# Patient Record
Sex: Female | Born: 1980
Health system: Southern US, Community
[De-identification: ages and names within clinical notes are randomized; demographics above are authoritative.]

## PROBLEM LIST (undated history)

## (undated) DIAGNOSIS — J189 Pneumonia, unspecified organism: Secondary | ICD-10-CM

## (undated) DIAGNOSIS — I10 Essential (primary) hypertension: Secondary | ICD-10-CM

## (undated) DIAGNOSIS — M7989 Other specified soft tissue disorders: Secondary | ICD-10-CM

## (undated) DIAGNOSIS — E669 Obesity, unspecified: Secondary | ICD-10-CM

## (undated) DIAGNOSIS — G5600 Carpal tunnel syndrome, unspecified upper limb: Secondary | ICD-10-CM

## (undated) DIAGNOSIS — E119 Type 2 diabetes mellitus without complications: Secondary | ICD-10-CM

## (undated) DIAGNOSIS — G932 Benign intracranial hypertension: Secondary | ICD-10-CM

## (undated) DIAGNOSIS — R0602 Shortness of breath: Secondary | ICD-10-CM

## (undated) DIAGNOSIS — M549 Dorsalgia, unspecified: Secondary | ICD-10-CM

## (undated) DIAGNOSIS — E7800 Pure hypercholesterolemia, unspecified: Secondary | ICD-10-CM

## (undated) HISTORY — DX: Dorsalgia, unspecified: M54.9

## (undated) HISTORY — DX: Pure hypercholesterolemia, unspecified: E78.00

## (undated) HISTORY — DX: Shortness of breath: R06.02

## (undated) HISTORY — DX: Type 2 diabetes mellitus without complications: E11.9

## (undated) HISTORY — DX: Carpal tunnel syndrome, unspecified upper limb: G56.00

## (undated) HISTORY — DX: Benign intracranial hypertension: G93.2

## (undated) HISTORY — DX: Obesity, unspecified: E66.9

## (undated) HISTORY — DX: Other specified soft tissue disorders: M79.89

## (undated) HISTORY — DX: Essential (primary) hypertension: I10

## (undated) HISTORY — PX: NO PAST SURGERIES: SHX2092

---

## 2004-06-04 ENCOUNTER — Other Ambulatory Visit: Admission: RE | Admit: 2004-06-04 | Discharge: 2004-06-04 | Payer: Self-pay | Admitting: Gynecology

## 2004-08-13 ENCOUNTER — Encounter: Admission: RE | Admit: 2004-08-13 | Discharge: 2004-08-13 | Payer: Self-pay | Admitting: Internal Medicine

## 2005-07-23 ENCOUNTER — Other Ambulatory Visit: Admission: RE | Admit: 2005-07-23 | Discharge: 2005-07-23 | Payer: Self-pay | Admitting: Gynecology

## 2005-10-16 ENCOUNTER — Encounter: Admission: RE | Admit: 2005-10-16 | Discharge: 2005-10-16 | Payer: Self-pay | Admitting: Internal Medicine

## 2008-12-29 ENCOUNTER — Encounter: Admission: RE | Admit: 2008-12-29 | Discharge: 2008-12-29 | Payer: Self-pay | Admitting: Internal Medicine

## 2011-07-11 ENCOUNTER — Other Ambulatory Visit (HOSPITAL_COMMUNITY): Payer: Self-pay | Admitting: Internal Medicine

## 2011-07-11 DIAGNOSIS — I2699 Other pulmonary embolism without acute cor pulmonale: Secondary | ICD-10-CM

## 2011-07-11 DIAGNOSIS — R000 Tachycardia, unspecified: Secondary | ICD-10-CM

## 2011-07-12 ENCOUNTER — Ambulatory Visit (HOSPITAL_COMMUNITY)
Admission: RE | Admit: 2011-07-12 | Discharge: 2011-07-12 | Disposition: A | Discharge status: Home / Self Care | Payer: Medicare Other | Source: Ambulatory Visit | Attending: Internal Medicine | Admitting: Internal Medicine

## 2011-07-12 DIAGNOSIS — R079 Chest pain, unspecified: Secondary | ICD-10-CM | POA: Insufficient documentation

## 2011-07-12 DIAGNOSIS — R000 Tachycardia, unspecified: Secondary | ICD-10-CM | POA: Insufficient documentation

## 2011-07-12 DIAGNOSIS — I2699 Other pulmonary embolism without acute cor pulmonale: Secondary | ICD-10-CM

## 2011-07-12 MED ORDER — IOHEXOL 350 MG/ML SOLN
100.0000 mL | Freq: Once | INTRAVENOUS | Status: AC | PRN
  Administered 2011-07-12: 100 mL via INTRAVENOUS

## 2012-05-21 ENCOUNTER — Encounter: Payer: Self-pay | Admitting: *Deleted

## 2012-05-21 ENCOUNTER — Encounter: Payer: Medicare Other | Attending: Internal Medicine | Admitting: *Deleted

## 2012-05-21 DIAGNOSIS — Z713 Dietary counseling and surveillance: Secondary | ICD-10-CM | POA: Insufficient documentation

## 2012-05-21 DIAGNOSIS — E119 Type 2 diabetes mellitus without complications: Secondary | ICD-10-CM | POA: Insufficient documentation

## 2012-05-21 NOTE — Patient Instructions (Signed)
Plan:  Aim for 4 Carb Choices per meal (60 grams) +/- 1 either way  Aim for 0-2 Carbs per snack if hungry  Consider reading food labels for Total Carbohydrate of foods Consider  increasing your activity level by jumping rope, dancing or treadmill for 15 minutes daily as tolerated Consider walking at the park with family 2-3 days a week

## 2012-05-21 NOTE — Progress Notes (Signed)
  Medical Nutrition Therapy:  Appt start time: 1130 end time:  1230.  Assessment:  Primary concerns today: weight loss. Patient here with her mother and aunt who both appear supportive. Patient has mild mental retardation and did participate in the visit. She states she works at  [employer] 10 - 3 Monday through Friday.  Diet and activity history obtained thru assistance of her aunt and mother. Referral indicated diagnosis of diabetes but mother states she had pre-diabetes in the past but that her BG improved and no diabetes yet. No diabetes medications or A1c info provided from referral.  MEDICATIONS: see list   DIETARY INTAKE:  Usual eating pattern includes 3 meals and 1-2 snacks per day.  Everyday foods include fair variety of all food groups.  Avoided foods include some fruits she doesn't care for.    24-hr recall:  B ( AM): waffle x 1-2, syrup, oleo, water OR grits, bacon OR sweet cereal with 2% milk (8 oz) Snk ( AM): Cheetos in small bag, diet Coke  L ( PM): PNB sandwich, yogurt OR Spaghetti leftovers, Diet Coke  Snk ( PM): occasionally cereal in large portion D ( PM): take out often: [place] egg roll, 1/2 ofshrimp with pork fried rice, water, OR Hamburgers, fries small  OR [employer]:pasta meal,  Snk ( PM): none Beverages: water, Diet Coke, coffee with Sweet and Low and cream, occasionally a beer  Usual physical activity: walk and jump rope at home, likes to dance  Estimated energy needs: 1600 calories 180 g carbohydrates 120 g protein 44 g fat  Progress Towards Goal(s):  In progress.   Nutritional Diagnosis:  NI-1.5 Excessive energy intake As related to activity level.  As evidenced by BMI of 51.3.    Intervention:  Nutrition counseling for weight loss initiated. Discussed Carb Counting as method for making food choice and portion size decisions, reading food labels, and benefits of increased activity. Mother and aunt both expressed good verbal understanding of the 4  Carb plan per meal and how to read food labels for assessment of portions of foods. Patient states she enjoys jumping rope and dancing so those were chosen as activities of choice to increase her metabolism. Also explained rationale of weight loss and increased activity to post pone or perhaps prevent diagnosis of diabetes.  Plan:  Aim for 4 Carb Choices per meal (60 grams) +/- 1 either way  Aim for 0-2 Carbs per snack if hungry  Consider reading food labels for Total Carbohydrate of foods Consider  increasing your activity level by jumping rope, dancing or treadmill for 15 minutes daily as tolerated Consider walking at the park with family 2-3 days a week  Handouts given during visit include: Carb Counting and Food Label handouts Meal Plan Card  Monitoring/Evaluation:  Dietary intake, exercise, reading food labels, and body weight in 6 week(s). Family to call to make the appointment once they check their calendars

## 2012-05-23 ENCOUNTER — Encounter: Payer: Self-pay | Admitting: *Deleted

## 2012-06-29 ENCOUNTER — Ambulatory Visit: Payer: Medicare Other | Admitting: *Deleted

## 2019-04-20 ENCOUNTER — Ambulatory Visit (INDEPENDENT_AMBULATORY_CARE_PROVIDER_SITE_OTHER)
Admission: EM | Admit: 2019-04-20 | Discharge: 2019-04-20 | Disposition: A | Discharge status: Home / Self Care | Payer: Medicare Other | Source: Home / Self Care

## 2019-04-20 ENCOUNTER — Other Ambulatory Visit: Payer: Self-pay

## 2019-04-20 ENCOUNTER — Encounter (HOSPITAL_COMMUNITY): Payer: Self-pay

## 2019-04-20 ENCOUNTER — Inpatient Hospital Stay (HOSPITAL_COMMUNITY)
Admission: EM | Admit: 2019-04-20 | Discharge: 2019-04-24 | DRG: 871 | Disposition: A | Discharge status: Home / Self Care | Payer: Medicare Other | Attending: Internal Medicine | Admitting: Internal Medicine

## 2019-04-20 ENCOUNTER — Emergency Department (HOSPITAL_COMMUNITY): Payer: Medicare Other

## 2019-04-20 ENCOUNTER — Ambulatory Visit (HOSPITAL_COMMUNITY)
Admission: RE | Admit: 2019-04-20 | Discharge: 2019-04-20 | Disposition: A | Discharge status: Home / Self Care | Payer: Medicare Other | Source: Ambulatory Visit | Attending: Family Medicine | Admitting: Family Medicine

## 2019-04-20 DIAGNOSIS — E119 Type 2 diabetes mellitus without complications: Secondary | ICD-10-CM | POA: Insufficient documentation

## 2019-04-20 DIAGNOSIS — R0602 Shortness of breath: Secondary | ICD-10-CM

## 2019-04-20 DIAGNOSIS — J189 Pneumonia, unspecified organism: Secondary | ICD-10-CM | POA: Diagnosis present

## 2019-04-20 DIAGNOSIS — I152 Hypertension secondary to endocrine disorders: Secondary | ICD-10-CM | POA: Diagnosis present

## 2019-04-20 DIAGNOSIS — A419 Sepsis, unspecified organism: Secondary | ICD-10-CM | POA: Diagnosis not present

## 2019-04-20 DIAGNOSIS — R0902 Hypoxemia: Secondary | ICD-10-CM | POA: Insufficient documentation

## 2019-04-20 DIAGNOSIS — E669 Obesity, unspecified: Secondary | ICD-10-CM | POA: Diagnosis present

## 2019-04-20 DIAGNOSIS — J9601 Acute respiratory failure with hypoxia: Secondary | ICD-10-CM | POA: Diagnosis present

## 2019-04-20 DIAGNOSIS — Z6841 Body Mass Index (BMI) 40.0 and over, adult: Secondary | ICD-10-CM

## 2019-04-20 DIAGNOSIS — Z8249 Family history of ischemic heart disease and other diseases of the circulatory system: Secondary | ICD-10-CM

## 2019-04-20 DIAGNOSIS — I160 Hypertensive urgency: Secondary | ICD-10-CM | POA: Diagnosis present

## 2019-04-20 DIAGNOSIS — R509 Fever, unspecified: Secondary | ICD-10-CM | POA: Insufficient documentation

## 2019-04-20 DIAGNOSIS — Z79899 Other long term (current) drug therapy: Secondary | ICD-10-CM | POA: Insufficient documentation

## 2019-04-20 DIAGNOSIS — R739 Hyperglycemia, unspecified: Secondary | ICD-10-CM | POA: Diagnosis present

## 2019-04-20 DIAGNOSIS — Z20828 Contact with and (suspected) exposure to other viral communicable diseases: Secondary | ICD-10-CM | POA: Insufficient documentation

## 2019-04-20 DIAGNOSIS — F79 Unspecified intellectual disabilities: Secondary | ICD-10-CM | POA: Insufficient documentation

## 2019-04-20 DIAGNOSIS — E1165 Type 2 diabetes mellitus with hyperglycemia: Secondary | ICD-10-CM | POA: Diagnosis present

## 2019-04-20 DIAGNOSIS — R05 Cough: Secondary | ICD-10-CM | POA: Insufficient documentation

## 2019-04-20 DIAGNOSIS — R6250 Unspecified lack of expected normal physiological development in childhood: Secondary | ICD-10-CM | POA: Diagnosis present

## 2019-04-20 DIAGNOSIS — I10 Essential (primary) hypertension: Secondary | ICD-10-CM | POA: Diagnosis present

## 2019-04-20 DIAGNOSIS — Z833 Family history of diabetes mellitus: Secondary | ICD-10-CM | POA: Insufficient documentation

## 2019-04-20 LAB — RESPIRATORY PANEL BY RT PCR (FLU A&B, COVID)
Influenza A by PCR: NEGATIVE
Influenza B by PCR: NEGATIVE
SARS Coronavirus 2 by RT PCR: NEGATIVE

## 2019-04-20 LAB — POC SARS CORONAVIRUS 2 AG -  ED: SARS Coronavirus 2 Ag: NEGATIVE

## 2019-04-20 LAB — POC SARS CORONAVIRUS 2 AG: SARS Coronavirus 2 Ag: NEGATIVE

## 2019-04-20 MED ORDER — SODIUM CHLORIDE 0.9 % IV SOLN
1000.0000 mL | INTRAVENOUS | Status: DC
Start: 2019-04-20 — End: 2019-04-21
  Administered 2019-04-20: 23:00:00 1000 mL via INTRAVENOUS

## 2019-04-20 MED ORDER — SODIUM CHLORIDE 0.9 % IV BOLUS
1000.0000 mL | Freq: Once | INTRAVENOUS | Status: AC
  Administered 2019-04-20: 1000 mL via INTRAVENOUS

## 2019-04-20 NOTE — ED Triage Notes (Signed)
Pt accompanied by mother who states pt has had cough and sob at home for the past few days. Pt went to PCP today and they called with results of penumonia on xray. RR 30-35 in triage, 76% on room air, 3L [place] applied, increased saturation to 100%. Tachycardic 130. Pt alert, anxious

## 2019-04-20 NOTE — ED Provider Notes (Signed)
[facility]    CSN: [id number] Arrival date & time: 04/20/19  1409      History   Chief Complaint Chief Complaint  Patient presents with  . Cough    HPI [name] is a 38 y.o. female.   Patient is a 38 year old female past medical history of diabetes, obesity, mental retardation.  She presents today with Regina Wood.  Per Regina Wood she has had cough, headache, fever for approximately 1 week.  They have been given Tylenol without much relief of her symptoms.  Upon arrival today she is mildly tachypneic with oxygen saturations 85% and pulse 122.  Patient is very anxious.  Regina Wood reports the cough has been dry.  She goes to school 2 days a week. No hx of DVT or PE. No recent travels.   ROS per HPI    Cough   Past Medical History:  Diagnosis Date  . Diabetes mellitus without complication (HCC)   . Obesity     There are no problems to display for this patient.   History reviewed. No pertinent surgical history.  OB History   No obstetric history on file.      Home Medications    Prior to Admission medications   Medication Sig Start Date End Date Taking? Authorizing Provider  ARIPiprazole (ABILIFY) 10 MG tablet Take 10 mg by mouth daily.    [provider]  benztropine (COGENTIN) 1 MG tablet Take 1 mg by mouth daily.    [provider]  naproxen sodium (ANAPROX) 550 MG tablet Take 550 mg by mouth as needed.    [provider]    Family History Family History  Problem Relation Age of Onset  . Healthy Mother   . Hypertension Father   . Diabetes Father     Social History Social History   Tobacco Use  . Smoking status: Never Smoker  . Smokeless tobacco: Never Used  Substance Use Topics  . Alcohol use: Yes    Comment: twice a month, wine or beer  . Drug use: Not on file     Allergies   Patient has no known allergies.   Review of Systems Review of Systems  Respiratory: Positive for cough.      Physical Exam Triage  Vital Signs ED Triage Vitals  Enc Vitals Group     BP 04/20/19 1439 (!) 185/84     Pulse Rate 04/20/19 1439 (!) 122     Resp 04/20/19 1439 (!) 21     Temp 04/20/19 1439 99 F (37.2 C)     Temp Source 04/20/19 1439 Oral     SpO2 04/20/19 1439 (!) 85 %     Weight 04/20/19 1435 287 lb (130.2 kg)     Height --      Head Circumference --      Peak Flow --      Pain Score 04/20/19 1433 0     Pain Loc --      Pain Edu? --      Excl. in GC? --    No data found.  Updated Vital Signs BP (!) 185/84 (BP Location: Right Arm)   Pulse (!) 122   Temp 99 F (37.2 C) (Oral)   Resp (!) 21   Wt 287 lb (130.2 kg)   LMP 04/05/2019   SpO2 (!) 85%   BMI 56.05 kg/m   Visual Acuity Right Eye Distance:   Left Eye Distance:   Bilateral Distance:    Right Eye  Near:   Left Eye Near:    Bilateral Near:     Physical Exam Vitals and nursing note reviewed.  Constitutional:      General: She is not in acute distress.    Appearance: She is obese. She is ill-appearing. She is not toxic-appearing or diaphoretic.  HENT:     Head: Normocephalic and atraumatic.     Nose: Nose normal.  Eyes:     Conjunctiva/sclera: Conjunctivae normal.  Cardiovascular:     Rate and Rhythm: Tachycardia present.  Pulmonary:     Effort: Tachypnea present. No retractions.     Breath sounds: Normal breath sounds.  Musculoskeletal:        General: Normal range of motion.  Skin:    General: Skin is warm and dry.  Neurological:     Mental Status: She is alert.  Psychiatric:        Mood and Affect: Mood normal.      UC Treatments / Results  Labs (all labs ordered are listed, but only abnormal results are displayed) Labs Reviewed  NOVEL CORONAVIRUS, NAA (HOSP ORDER, SEND-OUT TO REF LAB; TAT 18-24 HRS)  POC SARS CORONAVIRUS 2 AG  POC SARS CORONAVIRUS 2 AG -  ED    EKG   Radiology DG Chest 2 View  Result Date: 04/20/2019 CLINICAL DATA:  Cough, fatigue, query COVID EXAM: CHEST - 2 VIEW COMPARISON:   12/29/2008 FINDINGS: Mild cardiomegaly. Diffuse bilateral heterogeneous airspace opacity, most conspicuous in the right lung base. The visualized skeletal structures are unremarkable. IMPRESSION: Diffuse bilateral heterogeneous airspace opacity, most conspicuous in the right lung base and consistent with multifocal infection, including COVID-19 if clinically suspected. Electronically Signed   By: Eddie Candle M.D.   On: 04/20/2019 16:39    Procedures Procedures (including critical care time)  Medications Ordered in UC Medications - No data to display  Initial Impression / Assessment and Plan / UC Course  I have reviewed the triage vital signs and the nursing notes.  Pertinent labs & imaging results that were available during my care of the patient were reviewed by me and considered in my medical decision making (see chart for details).     38 year old presents today with cough, shortness of breath-oxygen saturations 85%.  Highest oxygen she was 91%.  Patient tachycardic at 122.  Patient very anxious about being here in the situation. Rapid Covid test negative here.  Most likely false negative or possibly dealing with pneumonia here. Spoke with Regina Wood and recommended patient go to the hospital for further evaluation based on hypoxia. Regina Wood does not want to take patient to the hospital and reports he will follow with primary care. Regina Wood is her PCP.  Discussed the risk of not taking her to the hospital.  We agreed on going for outpatient x ray to get a better picture of what may be going on.  Spoke with Dr. Hervey Ard nurse over the phone with my concerns. They would like for me to call once I receive the x ray report.   Received the x ray results which revealed bilateral multifocal pneumonia and concern for COVID pneumonia which is highly likely.   Spoke with Regina Wood over the phone directly and gave results and voiced my concern. He was also concerned. He is going to call the family and advise  that she be admitted to the hospital based on vital signs and results. Likely she has COVID  She was hypoxic at the urgent care and needs oxygen. This  is life threatening and she will likely decompensate quickly without intervention.  Final Clinical Impressions(s) / UC Diagnoses   Final diagnoses:  Hypoxia  Fever, unspecified  SOB (shortness of breath)     Discharge Instructions     Go to [facility] and get a chest x ray     ED Prescriptions    None     PDMP not reviewed this encounter.   Janace Aris, NP 04/20/19 1727

## 2019-04-20 NOTE — ED Provider Notes (Addendum)
[facility] EMERGENCY DEPARTMENT Provider Note   CSN: [id number] Arrival date & time: 04/20/19  1821     History Chief Complaint  Patient presents with  . Shortness of Breath  . Pneumonia    Regina Wood is a 38 y.o. female.  HPI   Patient presents ED for evaluation of shortness of breath.  Patient presented to an urgent care tonight with complaints of cough, headache and fever for 1 week.  Patient has been taking over-the-counter Tylenol.  When she arrived at the urgent care they noted she was tachycardic and her oxygen saturation was in the 80s.  Patient has a history of MR and goes to school 2 days a week.  There is question of a possible ill contact.  No history of DVT or PE no recent travels.  Patient herself tells me that she has a headache but denied coughing.  Her family member however confirmed that she has been coughing.  Past Medical History:  Diagnosis Date  . Diabetes mellitus without complication (HCC)   . Obesity     There are no problems to display for this patient.   History reviewed. No pertinent surgical history.   OB History   No obstetric history on file.     Family History  Problem Relation Age of Onset  . Healthy Mother   . Hypertension Father   . Diabetes Father     Social History   Tobacco Use  . Smoking status: Never Smoker  . Smokeless tobacco: Never Used  Substance Use Topics  . Alcohol use: Yes    Comment: twice a month, wine or beer  . Drug use: Not on file    Home Medications Prior to Admission medications   Medication Sig Start Date End Date Taking? Authorizing Provider  ARIPiprazole (ABILIFY) 10 MG tablet Take 10 mg by mouth daily.    [provider]  benztropine (COGENTIN) 1 MG tablet Take 1 mg by mouth daily.    [provider]  naproxen sodium (ANAPROX) 550 MG tablet Take 550 mg by mouth as needed.    [provider]    Allergies    Patient has no known allergies.   Review of Systems   Review of Systems  All other systems reviewed and are negative.   Physical Exam Updated Vital Signs BP (!) 164/128   Pulse (!) 131   Temp 99.1 F (37.3 C) (Oral)   Resp (!) 35   LMP 04/05/2019   SpO2 99%   Physical Exam Vitals and nursing note reviewed.  Constitutional:      General: She is not in acute distress.    Appearance: She is well-developed.  HENT:     Head: Normocephalic and atraumatic.     Right Ear: External ear normal.     Left Ear: External ear normal.  Eyes:     General: No scleral icterus.       Right eye: No discharge.        Left eye: No discharge.     Conjunctiva/sclera: Conjunctivae normal.  Neck:     Trachea: No tracheal deviation.  Cardiovascular:     Rate and Rhythm: Regular rhythm. Tachycardia present.  Pulmonary:     Effort: Tachypnea present. No respiratory distress.     Breath sounds: Normal breath sounds. No stridor. No wheezing or rales.  Abdominal:     General: Bowel sounds are normal. There is no distension.     Palpations: Abdomen is  soft.     Tenderness: There is no abdominal tenderness. There is no guarding or rebound.  Musculoskeletal:        General: No tenderness.     Cervical back: Neck supple.  Skin:    General: Skin is warm and dry.     Findings: No rash.  Neurological:     Mental Status: She is alert.     Cranial Nerves: No cranial nerve deficit (no facial droop, extraocular movements intact, no slurred speech).     Sensory: No sensory deficit.     Motor: No abnormal muscle tone or seizure activity.     Coordination: Coordination normal.     ED Results / Procedures / Treatments   Labs (all labs ordered are listed, but only abnormal results are displayed) Labs Reviewed  CULTURE, BLOOD (ROUTINE X 2)  CULTURE, BLOOD (ROUTINE X 2)  LACTIC ACID, PLASMA  LACTIC ACID, PLASMA  CBC WITH DIFFERENTIAL/PLATELET  COMPREHENSIVE METABOLIC PANEL  D-DIMER, QUANTITATIVE (NOT AT [name])  PROCALCITONIN  LACTATE  DEHYDROGENASE  FERRITIN  TRIGLYCERIDES  FIBRINOGEN  C-REACTIVE PROTEIN  I-STAT BETA HCG BLOOD, ED ([facility], [facility], [facility] ONLY)    EKG None  Radiology DG Chest 2 View  Result Date: 04/20/2019 CLINICAL DATA:  Cough, fatigue, query COVID EXAM: CHEST - 2 VIEW COMPARISON:  12/29/2008 FINDINGS: Mild cardiomegaly. Diffuse bilateral heterogeneous airspace opacity, most conspicuous in the right lung base. The visualized skeletal structures are unremarkable. IMPRESSION: Diffuse bilateral heterogeneous airspace opacity, most conspicuous in the right lung base and consistent with multifocal infection, including COVID-19 if clinically suspected. Electronically Signed   By: Eddie Candle M.D.   On: 04/20/2019 16:39    Procedures Procedures (including critical care time)  Medications Ordered in ED Medications  0.9 %  sodium chloride infusion (has no administration in time range)    ED Course  I have reviewed the triage vital signs and the nursing notes.  Pertinent labs & imaging results that were available during my care of the patient were reviewed by me and considered in my medical decision making (see chart for details).  Clinical Course as of Apr 20 1633  Tue Apr 20, 2019  2223 IV team started an IV.  Labs have been drawn.  Tachycardia remains.  Will add fluid bolus with negative covid   [JK]    Clinical Course User Index [JK] Dorie Rank, MD   MDM Rules/Calculators/A&P                      Pt presented with cough fever, tachycardia.  Initial covid tests negative.  Also negative for flu.  Sx most likely infectious in nature but some concerns with her initial tachycardia.  D dimer, Labs pending at time of shift change.  Care turned over to Dr Bayard Males Final Clinical Impression(s) / ED Diagnoses pending   Dorie Rank, MD 04/21/19 1635    Dorie Rank, MD 05/09/19 2003

## 2019-04-20 NOTE — Discharge Instructions (Signed)
Go to [facility] and get a chest x ray

## 2019-04-20 NOTE — ED Triage Notes (Signed)
Pt. States she has has a cough for 4 days now, and has felt fatigue. Wants COVID testing.

## 2019-04-21 ENCOUNTER — Emergency Department (HOSPITAL_COMMUNITY): Payer: Medicare Other

## 2019-04-21 ENCOUNTER — Encounter (HOSPITAL_COMMUNITY): Payer: Self-pay | Admitting: Family Medicine

## 2019-04-21 DIAGNOSIS — I160 Hypertensive urgency: Secondary | ICD-10-CM | POA: Diagnosis not present

## 2019-04-21 DIAGNOSIS — Z6841 Body Mass Index (BMI) 40.0 and over, adult: Secondary | ICD-10-CM | POA: Diagnosis not present

## 2019-04-21 DIAGNOSIS — Z79899 Other long term (current) drug therapy: Secondary | ICD-10-CM | POA: Diagnosis not present

## 2019-04-21 DIAGNOSIS — E669 Obesity, unspecified: Secondary | ICD-10-CM | POA: Diagnosis present

## 2019-04-21 DIAGNOSIS — I152 Hypertension secondary to endocrine disorders: Secondary | ICD-10-CM | POA: Diagnosis present

## 2019-04-21 DIAGNOSIS — J189 Pneumonia, unspecified organism: Secondary | ICD-10-CM | POA: Diagnosis present

## 2019-04-21 DIAGNOSIS — Z833 Family history of diabetes mellitus: Secondary | ICD-10-CM | POA: Diagnosis not present

## 2019-04-21 DIAGNOSIS — R739 Hyperglycemia, unspecified: Secondary | ICD-10-CM | POA: Diagnosis present

## 2019-04-21 DIAGNOSIS — Z8249 Family history of ischemic heart disease and other diseases of the circulatory system: Secondary | ICD-10-CM | POA: Diagnosis not present

## 2019-04-21 DIAGNOSIS — E1165 Type 2 diabetes mellitus with hyperglycemia: Secondary | ICD-10-CM | POA: Diagnosis present

## 2019-04-21 DIAGNOSIS — R6250 Unspecified lack of expected normal physiological development in childhood: Secondary | ICD-10-CM | POA: Diagnosis present

## 2019-04-21 DIAGNOSIS — J9601 Acute respiratory failure with hypoxia: Secondary | ICD-10-CM | POA: Diagnosis present

## 2019-04-21 DIAGNOSIS — I10 Essential (primary) hypertension: Secondary | ICD-10-CM | POA: Diagnosis present

## 2019-04-21 DIAGNOSIS — A419 Sepsis, unspecified organism: Secondary | ICD-10-CM | POA: Diagnosis present

## 2019-04-21 DIAGNOSIS — Z20828 Contact with and (suspected) exposure to other viral communicable diseases: Secondary | ICD-10-CM | POA: Diagnosis present

## 2019-04-21 LAB — GLUCOSE, CAPILLARY
Glucose-Capillary: 242 mg/dL — ABNORMAL HIGH (ref 70–99)
Glucose-Capillary: 207 mg/dL — ABNORMAL HIGH (ref 70–99)
Glucose-Capillary: 139 mg/dL — ABNORMAL HIGH (ref 70–99)
Glucose-Capillary: 157 mg/dL — ABNORMAL HIGH (ref 70–99)
Glucose-Capillary: 147 mg/dL — ABNORMAL HIGH (ref 70–99)

## 2019-04-21 LAB — CBC WITH DIFFERENTIAL/PLATELET
Abs Immature Granulocytes: 0.1 10*3/uL — ABNORMAL HIGH (ref 0.00–0.07)
Abs Immature Granulocytes: 0.11 10*3/uL — ABNORMAL HIGH (ref 0.00–0.07)
Basophils Absolute: 0 10*3/uL (ref 0.0–0.1)
Basophils Absolute: 0.1 10*3/uL (ref 0.0–0.1)
Basophils Relative: 0 %
Basophils Relative: 0 %
Eosinophils Absolute: 0 10*3/uL (ref 0.0–0.5)
Eosinophils Absolute: 0 10*3/uL (ref 0.0–0.5)
Eosinophils Relative: 0 %
Eosinophils Relative: 0 %
HCT: 46.4 % — ABNORMAL HIGH (ref 36.0–46.0)
HCT: 48.4 % — ABNORMAL HIGH (ref 36.0–46.0)
Hemoglobin: 13.7 g/dL (ref 12.0–15.0)
Hemoglobin: 14.4 g/dL (ref 12.0–15.0)
Immature Granulocytes: 1 %
Immature Granulocytes: 1 %
Lymphocytes Relative: 10 %
Lymphocytes Relative: 15 %
Lymphs Abs: 1.4 10*3/uL (ref 0.7–4.0)
Lymphs Abs: 2.2 10*3/uL (ref 0.7–4.0)
MCH: 23.1 pg — ABNORMAL LOW (ref 26.0–34.0)
MCH: 23.1 pg — ABNORMAL LOW (ref 26.0–34.0)
MCHC: 29.5 g/dL — ABNORMAL LOW (ref 30.0–36.0)
MCHC: 29.8 g/dL — ABNORMAL LOW (ref 30.0–36.0)
MCV: 77.7 fL — ABNORMAL LOW (ref 80.0–100.0)
MCV: 78.2 fL — ABNORMAL LOW (ref 80.0–100.0)
Monocytes Absolute: 0.5 10*3/uL (ref 0.1–1.0)
Monocytes Absolute: 0.6 10*3/uL (ref 0.1–1.0)
Monocytes Relative: 4 %
Monocytes Relative: 4 %
Neutro Abs: 11.4 10*3/uL — ABNORMAL HIGH (ref 1.7–7.7)
Neutro Abs: 11.7 10*3/uL — ABNORMAL HIGH (ref 1.7–7.7)
Neutrophils Relative %: 80 %
Neutrophils Relative %: 85 %
Platelets: 183 10*3/uL (ref 150–400)
Platelets: 202 10*3/uL (ref 150–400)
RBC: 5.93 MIL/uL — ABNORMAL HIGH (ref 3.87–5.11)
RBC: 6.23 MIL/uL — ABNORMAL HIGH (ref 3.87–5.11)
RDW: 21.8 % — ABNORMAL HIGH (ref 11.5–15.5)
RDW: 21.9 % — ABNORMAL HIGH (ref 11.5–15.5)
WBC: 13.4 10*3/uL — ABNORMAL HIGH (ref 4.0–10.5)
WBC: 14.6 10*3/uL — ABNORMAL HIGH (ref 4.0–10.5)
nRBC: 0.2 % (ref 0.0–0.2)
nRBC: 0.3 % — ABNORMAL HIGH (ref 0.0–0.2)

## 2019-04-21 LAB — COMPREHENSIVE METABOLIC PANEL
ALT: 41 U/L (ref 0–44)
AST: 24 U/L (ref 15–41)
Albumin: 3.1 g/dL — ABNORMAL LOW (ref 3.5–5.0)
Alkaline Phosphatase: 112 U/L (ref 38–126)
Anion gap: 12 (ref 5–15)
BUN: 5 mg/dL — ABNORMAL LOW (ref 6–20)
CO2: 31 mmol/L (ref 22–32)
Calcium: 8.9 mg/dL (ref 8.9–10.3)
Chloride: 98 mmol/L (ref 98–111)
Creatinine, Ser: 0.71 mg/dL (ref 0.44–1.00)
GFR calc Af Amer: >60 mL/min (ref >60)
GFR calc non Af Amer: >60 mL/min (ref >60)
Glucose, Bld: 212 mg/dL — ABNORMAL HIGH (ref 70–99)
Potassium: 3.9 mmol/L (ref 3.5–5.1)
Sodium: 141 mmol/L (ref 135–145)
Total Bilirubin: 1.6 mg/dL — ABNORMAL HIGH (ref 0.3–1.2)
Total Protein: 6.9 g/dL (ref 6.5–8.1)

## 2019-04-21 LAB — BASIC METABOLIC PANEL
Anion gap: 10 (ref 5–15)
BUN: <5 mg/dL — ABNORMAL LOW (ref 6–20)
CO2: 31 mmol/L (ref 22–32)
Calcium: 8.6 mg/dL — ABNORMAL LOW (ref 8.9–10.3)
Chloride: 99 mmol/L (ref 98–111)
Creatinine, Ser: 0.8 mg/dL (ref 0.44–1.00)
GFR calc Af Amer: >60 mL/min (ref >60)
GFR calc non Af Amer: >60 mL/min (ref >60)
Glucose, Bld: 280 mg/dL — ABNORMAL HIGH (ref 70–99)
Potassium: 4.1 mmol/L (ref 3.5–5.1)
Sodium: 140 mmol/L (ref 135–145)

## 2019-04-21 LAB — HEMOGLOBIN A1C
Hgb A1c MFr Bld: 8.7 % — ABNORMAL HIGH (ref 4.8–5.6)
Mean Plasma Glucose: 202.99 mg/dL

## 2019-04-21 LAB — TRIGLYCERIDES: Triglycerides: 104 mg/dL (ref <150)

## 2019-04-21 LAB — I-STAT BETA HCG BLOOD, ED (NOT ORDERABLE): I-stat hCG, quantitative: <5 m[IU]/mL (ref <5)

## 2019-04-21 LAB — PROCALCITONIN: Procalcitonin: 0.29 ng/mL

## 2019-04-21 LAB — FIBRINOGEN: Fibrinogen: 560 mg/dL — ABNORMAL HIGH (ref 210–475)

## 2019-04-21 LAB — HIV ANTIBODY (ROUTINE TESTING W REFLEX): HIV Screen 4th Generation wRfx: NONREACTIVE

## 2019-04-21 LAB — LACTIC ACID, PLASMA
Lactic Acid, Venous: 1.8 mmol/L (ref 0.5–1.9)
Lactic Acid, Venous: 1.8 mmol/L (ref 0.5–1.9)

## 2019-04-21 LAB — LACTATE DEHYDROGENASE: LDH: 259 U/L — ABNORMAL HIGH (ref 98–192)

## 2019-04-21 LAB — BRAIN NATRIURETIC PEPTIDE: B Natriuretic Peptide: 47 pg/mL (ref 0.0–100.0)

## 2019-04-21 LAB — D-DIMER, QUANTITATIVE: D-Dimer, Quant: 0.6 ug/mL-FEU — ABNORMAL HIGH (ref 0.00–0.50)

## 2019-04-21 LAB — FERRITIN: Ferritin: 25 ng/mL (ref 11–307)

## 2019-04-21 LAB — C-REACTIVE PROTEIN: CRP: 4 mg/dL — ABNORMAL HIGH (ref <1.0)

## 2019-04-21 MED ORDER — SODIUM CHLORIDE 0.9 % IV SOLN: 250.0000 mL | INTRAVENOUS | Status: DC | PRN

## 2019-04-21 MED ORDER — ENOXAPARIN SODIUM 60 MG/0.6ML ~~LOC~~ SOLN
60.0000 mg | SUBCUTANEOUS | Status: DC
  Administered 2019-04-21 – 2019-04-23 (×3): 60 mg via SUBCUTANEOUS
  Filled 2019-04-21 (×3): qty 0.6

## 2019-04-21 MED ORDER — ONDANSETRON HCL 4 MG/2ML IJ SOLN: 4.0000 mg | Freq: Four times a day (QID) | INTRAMUSCULAR | Status: DC | PRN

## 2019-04-21 MED ORDER — IOHEXOL 350 MG/ML SOLN
75.0000 mL | Freq: Once | INTRAVENOUS | Status: AC | PRN
  Administered 2019-04-21: 75 mL via INTRAVENOUS

## 2019-04-21 MED ORDER — HYDROCODONE-ACETAMINOPHEN 5-325 MG PO TABS
1.0000 | ORAL_TABLET | ORAL | Status: DC | PRN
  Administered 2019-04-23: 1 via ORAL
  Filled 2019-04-21: qty 1

## 2019-04-21 MED ORDER — SODIUM CHLORIDE 0.9 % IV SOLN
2.0000 g | INTRAVENOUS | Status: DC
  Administered 2019-04-21 – 2019-04-24 (×3): 2 g via INTRAVENOUS
  Filled 2019-04-21 (×2): qty 2
  Filled 2019-04-21 (×2): qty 20

## 2019-04-21 MED ORDER — SODIUM CHLORIDE 0.9 % IV SOLN
1.0000 g | Freq: Once | INTRAVENOUS | Status: AC
  Administered 2019-04-21: 1 g via INTRAVENOUS
  Filled 2019-04-21: qty 10

## 2019-04-21 MED ORDER — ARIPIPRAZOLE 10 MG PO TABS
10.0000 mg | ORAL_TABLET | Freq: Every day | ORAL | Status: DC
  Administered 2019-04-21 – 2019-04-24 (×4): 10 mg via ORAL
  Filled 2019-04-21 (×3): qty 2
  Filled 2019-04-21 (×2): qty 1
  Filled 2019-04-21: qty 2
  Filled 2019-04-21 (×2): qty 1

## 2019-04-21 MED ORDER — SODIUM CHLORIDE 0.9 % IV SOLN
500.0000 mg | INTRAVENOUS | Status: DC
  Administered 2019-04-22 – 2019-04-23 (×2): 500 mg via INTRAVENOUS
  Filled 2019-04-21 (×3): qty 500

## 2019-04-21 MED ORDER — POLYETHYLENE GLYCOL 3350 17 G PO PACK: 17.0000 g | PACK | Freq: Every day | ORAL | Status: DC | PRN

## 2019-04-21 MED ORDER — AZITHROMYCIN 250 MG PO TABS
500.0000 mg | ORAL_TABLET | Freq: Once | ORAL | Status: AC
  Administered 2019-04-21: 500 mg via ORAL
  Filled 2019-04-21: qty 2

## 2019-04-21 MED ORDER — SODIUM CHLORIDE 0.9% FLUSH
3.0000 mL | Freq: Two times a day (BID) | INTRAVENOUS | Status: DC
  Administered 2019-04-21 – 2019-04-24 (×7): 3 mL via INTRAVENOUS

## 2019-04-21 MED ORDER — SODIUM CHLORIDE 0.9% FLUSH: 3.0000 mL | INTRAVENOUS | Status: DC | PRN

## 2019-04-21 MED ORDER — ACETAMINOPHEN 325 MG PO TABS
650.0000 mg | ORAL_TABLET | Freq: Four times a day (QID) | ORAL | Status: DC | PRN
  Administered 2019-04-21 – 2019-04-24 (×6): 650 mg via ORAL
  Filled 2019-04-21 (×6): qty 2

## 2019-04-21 MED ORDER — BENZTROPINE MESYLATE 1 MG PO TABS
1.0000 mg | ORAL_TABLET | Freq: Every day | ORAL | Status: DC
  Administered 2019-04-21 – 2019-04-24 (×4): 1 mg via ORAL
  Filled 2019-04-21 (×4): qty 1

## 2019-04-21 MED ORDER — LABETALOL HCL 5 MG/ML IV SOLN: 10.0000 mg | INTRAVENOUS | Status: DC | PRN

## 2019-04-21 MED ORDER — INSULIN ASPART 100 UNIT/ML ~~LOC~~ SOLN
0.0000 [IU] | Freq: Three times a day (TID) | SUBCUTANEOUS | Status: DC
  Administered 2019-04-21: 2 [IU] via SUBCUTANEOUS
  Administered 2019-04-21: 3 [IU] via SUBCUTANEOUS
  Administered 2019-04-21: 1 [IU] via SUBCUTANEOUS
  Administered 2019-04-22: 2 [IU] via SUBCUTANEOUS
  Administered 2019-04-22 – 2019-04-23 (×2): 1 [IU] via SUBCUTANEOUS
  Administered 2019-04-23: 2 [IU] via SUBCUTANEOUS
  Administered 2019-04-24 (×2): 1 [IU] via SUBCUTANEOUS

## 2019-04-21 MED ORDER — ONDANSETRON HCL 4 MG PO TABS: 4.0000 mg | ORAL_TABLET | Freq: Four times a day (QID) | ORAL | Status: DC | PRN

## 2019-04-21 MED ORDER — ACETAMINOPHEN 650 MG RE SUPP: 650.0000 mg | Freq: Four times a day (QID) | RECTAL | Status: DC | PRN

## 2019-04-21 MED ORDER — INSULIN GLARGINE 100 UNIT/ML ~~LOC~~ SOLN
15.0000 [IU] | Freq: Every day | SUBCUTANEOUS | Status: DC
  Administered 2019-04-21 – 2019-04-24 (×4): 15 [IU] via SUBCUTANEOUS
  Filled 2019-04-21 (×4): qty 0.15

## 2019-04-21 MED ORDER — SODIUM CHLORIDE 0.9% FLUSH
3.0000 mL | Freq: Two times a day (BID) | INTRAVENOUS | Status: DC
  Administered 2019-04-21 – 2019-04-24 (×6): 3 mL via INTRAVENOUS

## 2019-04-21 NOTE — ED Provider Notes (Signed)
I assumed care in signout to follow-up on labs.  D-dimer was elevated so CT chest was ordered.  CT chest revealed groundglass opacities.  Initial Covid testing negative Patient still requiring oxygen at 4 L.  Overall work of breathing is improved.   Ripley Fraise, MD 04/21/19 [phone]

## 2019-04-21 NOTE — Progress Notes (Signed)
The chaplain delivered the family paperwork for an Advanced Directive.  The chaplain briefly spoke with the patient and the family and the family present let the chaplain know that they requested the AD.  The chaplain will follow-up if and when the family and the patient are ready to complete the paperwork.  The patient asked the chaplain if she could get more toast, the chaplain informed the nurse of the patients request.  Brion Aliment Chaplain Resident For questions concerning this note please contact me by pager [phone]

## 2019-04-21 NOTE — Progress Notes (Signed)
  PROGRESS NOTE  Patient admitted earlier this morning. See H&P.   Regina Wood is a 38 y.o. female with medical history significant for developmental delay and obesity, now presenting to the emergency department for evaluation of productive cough and shortness of breath.  Patient was accompanied by family member that assists with the history.  She has had a productive cough for 6 or 7 days now, has appeared dyspneic, and has seemed to be worsening.  They have not noted any lower extremity swelling and there is no history of DVT or PE.  She was evaluated for the symptoms at an urgent care where she had chest x-ray with nonspecific findings, negative COVID-19 test, tachycardia, tachypnea, and new supplemental oxygen requirement that prompted her to be directed to the ED. CBC features leukocytosis to 14,600.  COVID-19 testing is negative for the second time.  Procalcitonin is 0.29.  D-dimer was 0.60 and CTA chest was performed, negative for PE, but with nonspecific findings that could reflect viral infection or edema.  Patient was started on Rocephin and azithromycin and patient admitted to hospital for further treatment.   This morning, patient is sitting in bed, eating breakfast. Mother at bedside. Patient denies worsening SOB, remains comfortable on room air.  Acute hypoxemic respiratory failure secondary to CAP -CTA chest: Ground-glass heterogeneity throughout both lungs, some of which appear geographic. This may be secondary to atypical infection, including COVID-19, however is not classic. Pulmonary edema is considered but felt less likely given normal heart size. Superimposed linear opacities in the right lower, right upper and left upper lobes but likely represent atelectasis. -Required 4L [place] O2 at time of admission. Now weaned to room air  Sepsis secondary to CAP, Sepsis POA with tachycardia, tachypnea, respiratory failure  -COVID negative x2, flu negative  -Continue rocephin,  azithromycin -WBC improving, afebrile  -Blood cultures pending   Hypertensive urgency -BP improved  Developmental delay -Continue Abilify, cogentin   DM type 2, uncontrolled with hyperglycemia -Ha1c 8.7 -Novolog SSI. Add lantus today     Dessa Phi, DO [facility] 04/21/2019, 9:21 AM  Available via Epic secure chat 7am-7pm After these hours, please refer to coverage provider listed on [email or web address]

## 2019-04-21 NOTE — ED Notes (Signed)
ED TO INPATIENT HANDOFF REPORT  ED Nurse Name and Phone #:  Lucious Groves [phone] S Name/Age/Gender [name] 38 y.o. female Room/Bed: 023C/023C  Code Status   Code Status: Not on file  Home/SNF/Other Home   Triage Complete: Triage complete  Chief Complaint Acute respiratory failure with hypoxia (HCC) [J96.01]  Triage Note Pt accompanied by mother who states pt has had cough and sob at home for the past few days. Pt went to PCP today and they called with results of penumonia on xray. RR 30-35 in triage, 76% on room air, 3L [place] applied, increased saturation to 100%. Tachycardic 130. Pt alert, anxious    Allergies No Known Allergies  Level of Care/Admitting Diagnosis ED Disposition    ED Disposition Condition Comment   Admit  Hospital Area: [facility] [[id number]]  Level of Care: Telemetry Medical [[id number]]  Covid Evaluation: Confirmed COVID Negative  Diagnosis: Acute respiratory failure with hypoxia [facility]) [[id number]]  Admitting Physician: Briscoe Deutscher [[id number]]  Attending Physician: Briscoe Deutscher [[id number]]  Estimated length of stay: past midnight tomorrow  Certification:: I certify this patient will need inpatient services for at least 2 midnights       B Medical/Surgery History Past Medical History:  Diagnosis Date  . Diabetes mellitus without complication (HCC)   . Obesity    History reviewed. No pertinent surgical history.   A IV Location/Drains/Wounds Patient Lines/Drains/Airways Status   Active Line/Drains/Airways    Name:   Placement date:   Placement time:   Site:   Days:   Peripheral IV 04/20/19 Left;Anterior Forearm   04/20/19    2150    Forearm   1   Peripheral IV 04/20/19 Right;Anterior Forearm   04/20/19    2151    Forearm   1   Peripheral IV 04/21/19 Left Antecubital   04/21/19    0049    Antecubital   less than 1          Intake/Output Last 24 hours No intake or output data in the 24 hours ending 04/21/19  0447  Labs/Imaging Results for orders placed or performed during the hospital encounter of 04/20/19 (from the past 48 hour(s))  Respiratory Panel by RT PCR (Flu A&B, Covid) - Nasopharyngeal Swab     Status: None   Collection Time: 04/20/19  8:33 PM   Specimen: Nasopharyngeal Swab  Result Value Ref Range   SARS Coronavirus 2 by RT PCR NEGATIVE NEGATIVE    Comment: (NOTE) SARS-CoV-2 target nucleic acids are NOT DETECTED. The SARS-CoV-2 RNA is generally detectable in upper respiratoy specimens during the acute phase of infection. The lowest concentration of SARS-CoV-2 viral copies this assay can detect is 131 copies/mL. A negative result does not preclude SARS-Cov-2 infection and should not be used as the sole basis for treatment or other patient management decisions. A negative result may occur with  improper specimen collection/handling, submission of specimen other than nasopharyngeal swab, presence of viral mutation(s) within the areas targeted by this assay, and inadequate number of viral copies (<131 copies/mL). A negative result must be combined with clinical observations, patient history, and epidemiological information. The expected result is Negative. Fact Sheet for Patients:  https://www.moore.com/ Fact Sheet for Healthcare Providers:  https://www.young.biz/ This test is not yet ap proved or cleared by the [place] FDA and  has been authorized for detection and/or diagnosis of SARS-CoV-2 by FDA under an Emergency Use Authorization (EUA). This EUA will remain  in effect (meaning this  test can be used) for the duration of the COVID-19 declaration under Section 564(b)(1) of the Act, 21 U.S.C. section 360bbb-3(b)(1), unless the authorization is terminated or revoked sooner.    Influenza A by PCR NEGATIVE NEGATIVE   Influenza B by PCR NEGATIVE NEGATIVE    Comment: (NOTE) The Xpert Xpress SARS-CoV-2/FLU/RSV assay is intended as an aid  in  the diagnosis of influenza from Nasopharyngeal swab specimens and  should not be used as a sole basis for treatment. Nasal washings and  aspirates are unacceptable for Xpert Xpress SARS-CoV-2/FLU/RSV  testing. Fact Sheet for Patients: https://www.moore.com/ Fact Sheet for Healthcare Providers: https://www.young.biz/ This test is not yet approved or cleared by the [place] FDA and  has been authorized for detection and/or diagnosis of SARS-CoV-2 by  FDA under an Emergency Use Authorization (EUA). This EUA will remain  in effect (meaning this test can be used) for the duration of the  Covid-19 declaration under Section 564(b)(1) of the Act, 21  U.S.C. section 360bbb-3(b)(1), unless the authorization is  terminated or revoked. Performed at [facility], [address] [place], [place] [address]   Lactic acid, plasma     Status: None   Collection Time: 04/20/19  9:30 PM  Result Value Ref Range   Lactic Acid, Venous 1.8 0.5 - 1.9 mmol/L    Comment: Performed at [facility], [address] [place], [place] [address]  Lactic acid, plasma     Status: None   Collection Time: 04/20/19 11:38 PM  Result Value Ref Range   Lactic Acid, Venous 1.8 0.5 - 1.9 mmol/L    Comment: Performed at [facility], [address], [place], [place] [address]  CBC WITH DIFFERENTIAL     Status: Abnormal   Collection Time: 04/20/19 11:40 PM  Result Value Ref Range   WBC 14.6 (H) 4.0 - 10.5 K/uL   RBC 6.23 (H) 3.87 - 5.11 MIL/uL   Hemoglobin 14.4 12.0 - 15.0 g/dL   HCT 76.2 (H) 83.1 - 51.7 %   MCV 77.7 (L) 80.0 - 100.0 fL   MCH 23.1 (L) 26.0 - 34.0 pg   MCHC 29.8 (L) 30.0 - 36.0 g/dL   RDW 61.6 (H) 07.3 - 71.0 %   Platelets 202 150 - 400 K/uL   nRBC 0.3 (H) 0.0 - 0.2 %   Neutrophils Relative % 80 %   Neutro Abs 11.7 (H) 1.7 - 7.7 K/uL   Lymphocytes Relative 15 %   Lymphs Abs 2.2 0.7 - 4.0 K/uL   Monocytes Relative 4 %   Monocytes  Absolute 0.6 0.1 - 1.0 K/uL   Eosinophils Relative 0 %   Eosinophils Absolute 0.0 0.0 - 0.5 K/uL   Basophils Relative 0 %   Basophils Absolute 0.1 0.0 - 0.1 K/uL   Immature Granulocytes 1 %   Abs Immature Granulocytes 0.11 (H) 0.00 - 0.07 K/uL    Comment: Performed at [facility], [address] [place], [place] [address]  Comprehensive metabolic panel     Status: Abnormal   Collection Time: 04/20/19 11:40 PM  Result Value Ref Range   Sodium 141 135 - 145 mmol/L   Potassium 3.9 3.5 - 5.1 mmol/L   Chloride 98 98 - 111 mmol/L   CO2 31 22 - 32 mmol/L   Glucose, Bld 212 (H) 70 - 99 mg/dL   BUN 5 (L) 6 - 20 mg/dL   Creatinine, Ser 8.54 0.44 - 1.00 mg/dL  Calcium 8.9 8.9 - 10.3 mg/dL   Total Protein 6.9 6.5 - 8.1 g/dL   Albumin 3.1 (L) 3.5 - 5.0 g/dL   AST 24 15 - 41 U/L   ALT 41 0 - 44 U/L   Alkaline Phosphatase 112 38 - 126 U/L   Total Bilirubin 1.6 (H) 0.3 - 1.2 mg/dL   GFR calc non Af Amer >60 >60 mL/min   GFR calc Af Amer >60 >60 mL/min   Anion gap 12 5 - 15    Comment: Performed at [facility], [address] [place], [place] [address]  D-dimer, quantitative     Status: Abnormal   Collection Time: 04/20/19 11:40 PM  Result Value Ref Range   D-Dimer, Quant 0.60 (H) 0.00 - 0.50 ug/mL-FEU    Comment: (NOTE) At the manufacturer cut-off of 0.50 ug/mL FEU, this assay has been documented to exclude PE with a sensitivity and negative predictive value of 97 to 99%.  At this time, this assay has not been approved by the FDA to exclude DVT/VTE. Results should be correlated with clinical presentation. Performed at [facility], [address] [place], [place] [address]   Procalcitonin     Status: None   Collection Time: 04/20/19 11:40 PM  Result Value Ref Range   Procalcitonin 0.29 ng/mL    Comment:        Interpretation: PCT (Procalcitonin) <= 0.5 ng/mL: Systemic infection (sepsis) is not likely. Local bacterial infection is possible. (NOTE)        Sepsis PCT Algorithm           Lower Respiratory Tract                                      Infection PCT Algorithm    ----------------------------     ----------------------------         PCT < 0.25 ng/mL                PCT < 0.10 ng/mL         Strongly encourage             Strongly discourage   discontinuation of antibiotics    initiation of antibiotics    ----------------------------     -----------------------------       PCT 0.25 - 0.50 ng/mL            PCT 0.10 - 0.25 ng/mL               OR       >80% decrease in PCT            Discourage initiation of                                            antibiotics      Encourage discontinuation           of antibiotics    ----------------------------     -----------------------------         PCT >= 0.50 ng/mL              PCT 0.26 - 0.50 ng/mL               AND        <80% decrease in PCT  Encourage initiation of                                             antibiotics       Encourage continuation           of antibiotics    ----------------------------     -----------------------------        PCT >= 0.50 ng/mL                  PCT > 0.50 ng/mL               AND         increase in PCT                  Strongly encourage                                      initiation of antibiotics    Strongly encourage escalation           of antibiotics                                     -----------------------------                                           PCT <= 0.25 ng/mL                                                 OR                                        > 80% decrease in PCT                                     Discontinue / Do not initiate                                             antibiotics Performed at [facility], [address] [place], [place] [address]   Lactate dehydrogenase     Status: Abnormal   Collection Time: 04/20/19 11:40 PM  Result Value Ref Range   LDH 259 (H) 98 - 192 U/L    Comment:  Performed at [facility], [place] [address], [place], [place] [address]  Ferritin     Status: None   Collection Time: 04/20/19 11:40 PM  Result Value Ref Range   Ferritin 25 11 - 307 ng/mL    Comment: Performed at [facility] [address] [place], [place] [address]  Fibrinogen     Status: Abnormal   Collection Time: 04/20/19 11:40 PM  Result Value Ref  Range   Fibrinogen 560 (H) 210 - 475 mg/dL    Comment: Performed at [facility], [address] [place], [place] [address]  C-reactive protein     Status: Abnormal   Collection Time: 04/20/19 11:40 PM  Result Value Ref Range   CRP 4.0 (H) <1.0 mg/dL    Comment: Performed at [facility], [address], [place], [place] [address]  Triglycerides     Status: None   Collection Time: 04/20/19 11:41 PM  Result Value Ref Range   Triglycerides 104 <150 mg/dL    Comment: Performed at [facility], [address], [place], [place] [address]   DG Chest 2 View  Result Date: 04/20/2019 CLINICAL DATA:  Cough, fatigue, query COVID EXAM: CHEST - 2 VIEW COMPARISON:  12/29/2008 FINDINGS: Mild cardiomegaly. Diffuse bilateral heterogeneous airspace opacity, most conspicuous in the right lung base. The visualized skeletal structures are unremarkable. IMPRESSION: Diffuse bilateral heterogeneous airspace opacity, most conspicuous in the right lung base and consistent with multifocal infection, including COVID-19 if clinically suspected. Electronically Signed   By: Lauralyn Primes M.D.   On: 04/20/2019 16:39   CT Angio Chest PE W and/or Wo Contrast  Result Date: 04/21/2019 CLINICAL DATA:  Shortness of breath. Recent diagnosis of pneumonia. EXAM: CT ANGIOGRAPHY CHEST WITH CONTRAST TECHNIQUE: Multidetector CT imaging of the chest was performed using the standard protocol during bolus administration of intravenous contrast. Multiplanar CT image reconstructions and MIPs were obtained to evaluate the vascular anatomy. CONTRAST:  46mL  OMNIPAQUE IOHEXOL 350 MG/ML SOLN COMPARISON:  Radiographs yesterday. Chest CTA 07/12/2011 FINDINGS: Cardiovascular: There are no filling defects within the pulmonary arteries to suggest pulmonary embolus. Evaluation is diagnostic to the segmental level. Cannot assess subsegmental branches given contrast bolus timing and soft tissue attenuation related to habitus. No aortic dissection. Heart is normal in size. No pericardial effusion. Mediastinum/Nodes: Small mediastinal lymph nodes not enlarged by size criteria. Highest mediastinal node measures 9 mm. No enlarged hilar lymph nodes. Esophagus is decompressed. No visualized thyroid nodule. Prominent right epicardial fat pad. Lungs/Pleura: Ground-glass heterogeneity throughout both lungs, some of which appear geographic. There superimposed linear opacities in the right lower, right upper, and left upper lobes. There is central bronchial thickening. No pleural effusion. Upper Abdomen: Enlarged liver is partially included. No acute findings. Musculoskeletal: Dextroscoliotic curvature of the spine. There are no acute or suspicious osseous abnormalities. Review of the MIP images confirms the above findings. IMPRESSION: 1. No pulmonary embolus to the segmental level. 2. Ground-glass heterogeneity throughout both lungs, some of which appear geographic. This may be secondary to atypical infection, including COVID-19, however is not classic. Pulmonary edema is considered but felt less likely given normal heart size. Superimposed linear opacities in the right lower, right upper and left upper lobes but likely represent atelectasis. 3. Mild central bronchial thickening, can be seen with bronchitis or reactive airways disease. Electronically Signed   By: Narda Rutherford M.D.   On: 04/21/2019 02:38   DG Chest Port 1 View  Result Date: 04/20/2019 CLINICAL DATA:  Shortness of breath over the past few days. EXAM: PORTABLE CHEST 1 VIEW COMPARISON:  PA and lateral chest  04/20/2019 12/29/2008. FINDINGS: Hazy airspace disease in the right lung base is seen. Left lung is clear. Heart size is upper normal. No pneumothorax or pleural effusion. The exam is limited by the patient's body habitus and portable technique. IMPRESSION: Limited examination. Hazy basilar opacity on the right could be artifactual but could  represent pneumonia. Electronically Signed   By: Drusilla Kannerhomas  Dalessio M.D.   On: 04/20/2019 21:12    Pending Labs Unresulted Labs (From admission, onward)    Start     Ordered   04/20/19 1930  Blood Culture (routine x 2)  BLOOD CULTURE X 2,   STAT     04/20/19 1930          Vitals/Pain Today's Vitals   04/21/19 0222 04/21/19 0239 04/21/19 0300 04/21/19 0444  BP:  (!) 160/109 (!) 150/82 (!) 152/94  Pulse:  (!) 118 (!) 117 (!) 108  Resp:  20 (!) 23 (!) 22  Temp:      TempSrc:      SpO2:  94% 96% 94%  PainSc: 0-No pain 0-No pain  0-No pain    Isolation Precautions No active isolations  Medications Medications  0.9 %  sodium chloride infusion (1,000 mLs Intravenous New Bag/Given 04/20/19 2329)  labetalol (NORMODYNE) injection 10 mg (has no administration in time range)  sodium chloride 0.9 % bolus 1,000 mL (0 mLs Intravenous Stopped 04/21/19 0028)  cefTRIAXone (ROCEPHIN) 1 g in sodium chloride 0.9 % 100 mL IVPB (0 g Intravenous Stopped 04/21/19 0127)  azithromycin (ZITHROMAX) tablet 500 mg (500 mg Oral Given 04/21/19 0024)  iohexol (OMNIPAQUE) 350 MG/ML injection 75 mL (75 mLs Intravenous Contrast Given 04/21/19 0154)    Mobility walks     Focused Assessments Family at bedside .    R Recommendations: See Admitting Provider Note  Report given to:   Additional Notes:

## 2019-04-21 NOTE — H&P (Signed)
History and Physical    Regina Wood:[id number] DOB: February 21, 1981 DOA: 04/20/2019  PCP: Nila Nephew, MD   Patient coming from: Home   Chief Complaint: cough, SOB   HPI: Regina Wood is a 38 y.o. female with medical history significant for developmental delay and obesity, now presenting to the emergency department for evaluation of productive cough and shortness of breath.  Patient is accompanied by family member that assists with the history.  She has had a productive cough for 6 or 7 days now, has appeared dyspneic, and has seemed to be worsening.  They have not noted any lower extremity swelling and there is no history of DVT or PE.  She was evaluated for the symptoms at an urgent care where she had chest x-ray with nonspecific findings, negative COVID-19 test, tachycardia, tachypnea, and new supplemental oxygen requirement that prompted her to be directed to the ED.  ED Course: Upon arrival to the ED, patient is found to be afebrile, requiring 4 L/min of supplemental oxygen, tachypneic, tachycardic, and hypertensive.  EKG features sinus tachycardia with rate 128.  Chemistry panel is notable for glucose of 212.  CBC features leukocytosis to 14,600.  COVID-19 testing is negative for the second time.  Procalcitonin is 0.29.  D-dimer was 0.60 and CTA chest was performed, negative for PE, but with nonspecific findings that could reflect infection or edema.  Blood cultures collected in the ED, 1 L of normal saline administered, and the patient was started on Rocephin and azithromycin.  Hospitalist were asked to admit.  Review of Systems:  All other systems reviewed and apart from HPI, are negative.  Past Medical History:  Diagnosis Date  . Diabetes mellitus without complication (HCC)   . Obesity     History reviewed. No pertinent surgical history.   reports that she has never smoked. She has never used smokeless tobacco. She reports current alcohol use. No history on file for  drug.  No Known Allergies  Family History  Problem Relation Age of Onset  . Healthy Mother   . Hypertension Father   . Diabetes Father      Prior to Admission medications   Medication Sig Start Date End Date Taking? Authorizing Provider  ARIPiprazole (ABILIFY) 10 MG tablet Take 10 mg by mouth daily.   Yes [provider]  benztropine (COGENTIN) 1 MG tablet Take 1 mg by mouth daily.   Yes [provider]  naproxen sodium (ANAPROX) 550 MG tablet Take 550 mg by mouth as needed.    [provider]    Physical Exam: Vitals:   04/21/19 0030 04/21/19 0239 04/21/19 0300 04/21/19 0444  BP: (!) 162/106 (!) 160/109 (!) 150/82 (!) 152/94  Pulse: (!) 120 (!) 118 (!) 117 (!) 108  Resp: (!) 28 20 (!) 23 (!) 22  Temp:      TempSrc:      SpO2: 98% 94% 96% 94%    Constitutional: NAD, calm  Eyes: PERTLA, lids and conjunctivae normal ENMT: Mucous membranes are moist. Posterior pharynx clear of any exudate or lesions.   Neck: normal, supple, no masses, no thyromegaly Respiratory: Tachypnea. No accessory muscle use. No pallor or cyanosis.  Cardiovascular: rate ~110 and regular. No extremity edema.  Abdomen: No distension, no tenderness, soft. Bowel sounds active.  Musculoskeletal: no clubbing / cyanosis. No joint deformity upper and lower extremities.    Skin: no significant rashes, lesions, ulcers. Warm, dry, well-perfused. Neurologic: No facial asymmetry. Sensation intact. Moving all extremities.  Psychiatric:  Alert and oriented to person and place only.  Pleasant, cooperative.    Labs on Admission: I have personally reviewed following labs and imaging studies  CBC: Recent Labs  Lab 04/20/19 2340  WBC 14.6*  NEUTROABS 11.7*  HGB 14.4  HCT 48.4*  MCV 77.7*  PLT 694   Basic Metabolic Panel: Recent Labs  Lab 04/20/19 2340  NA 141  K 3.9  CL 98  CO2 31  GLUCOSE 212*  BUN 5*  CREATININE 0.71  CALCIUM 8.9   GFR: CrCl cannot be calculated  (Unknown ideal weight.). Liver Function Tests: Recent Labs  Lab 04/20/19 2340  AST 24  ALT 41  ALKPHOS 112  BILITOT 1.6*  PROT 6.9  ALBUMIN 3.1*   No results for input(s): LIPASE, AMYLASE in the last 168 hours. No results for input(s): AMMONIA in the last 168 hours. Coagulation Profile: No results for input(s): INR, PROTIME in the last 168 hours. Cardiac Enzymes: No results for input(s): CKTOTAL, CKMB, CKMBINDEX, TROPONINI in the last 168 hours. BNP (last 3 results) No results for input(s): PROBNP in the last 8760 hours. HbA1C: No results for input(s): HGBA1C in the last 72 hours. CBG: No results for input(s): GLUCAP in the last 168 hours. Lipid Profile: Recent Labs    04/20/19 2341  TRIG 104   Thyroid Function Tests: No results for input(s): TSH, T4TOTAL, FREET4, T3FREE, THYROIDAB in the last 72 hours. Anemia Panel: Recent Labs    04/20/19 2340  FERRITIN 25   Urine analysis: No results found for: COLORURINE, APPEARANCEUR, LABSPEC, PHURINE, GLUCOSEU, HGBUR, BILIRUBINUR, KETONESUR, PROTEINUR, UROBILINOGEN, NITRITE, LEUKOCYTESUR Sepsis Labs: @LABRCNTIP (procalcitonin:4,lacticidven:4) ) Recent Results (from the past 240 hour(s))  Respiratory Panel by RT PCR (Flu A&B, Covid) - Nasopharyngeal Swab     Status: None   Collection Time: 04/20/19  8:33 PM   Specimen: Nasopharyngeal Swab  Result Value Ref Range Status   SARS Coronavirus 2 by RT PCR NEGATIVE NEGATIVE Final    Comment: (NOTE) SARS-CoV-2 target nucleic acids are NOT DETECTED. The SARS-CoV-2 RNA is generally detectable in upper respiratoy specimens during the acute phase of infection. The lowest concentration of SARS-CoV-2 viral copies this assay can detect is 131 copies/mL. A negative result does not preclude SARS-Cov-2 infection and should not be used as the sole basis for treatment or other patient management decisions. A negative result may occur with  improper specimen collection/handling, submission of  specimen other than nasopharyngeal swab, presence of viral mutation(s) within the areas targeted by this assay, and inadequate number of viral copies (<131 copies/mL). A negative result must be combined with clinical observations, patient history, and epidemiological information. The expected result is Negative. Fact Sheet for Patients:  PinkCheek.be Fact Sheet for Healthcare Providers:  GravelBags.it This test is not yet ap proved or cleared by the [place] FDA and  has been authorized for detection and/or diagnosis of SARS-CoV-2 by FDA under an Emergency Use Authorization (EUA). This EUA will remain  in effect (meaning this test can be used) for the duration of the COVID-19 declaration under Section 564(b)(1) of the Act, 21 U.S.C. section 360bbb-3(b)(1), unless the authorization is terminated or revoked sooner.    Influenza A by PCR NEGATIVE NEGATIVE Final   Influenza B by PCR NEGATIVE NEGATIVE Final    Comment: (NOTE) The Xpert Xpress SARS-CoV-2/FLU/RSV assay is intended as an aid in  the diagnosis of influenza from Nasopharyngeal swab specimens and  should not be used as a sole basis for treatment. Nasal washings  and  aspirates are unacceptable for Xpert Xpress SARS-CoV-2/FLU/RSV  testing. Fact Sheet for Patients: https://www.moore.com/ Fact Sheet for Healthcare Providers: https://www.young.biz/ This test is not yet approved or cleared by the [place] FDA and  has been authorized for detection and/or diagnosis of SARS-CoV-2 by  FDA under an Emergency Use Authorization (EUA). This EUA will remain  in effect (meaning this test can be used) for the duration of the  Covid-19 declaration under Section 564(b)(1) of the Act, 21  U.S.C. section 360bbb-3(b)(1), unless the authorization is  terminated or revoked. Performed at [facility], [address] [place],  [place] [address]      Radiological Exams on Admission: DG Chest 2 View  Result Date: 04/20/2019 CLINICAL DATA:  Cough, fatigue, query COVID EXAM: CHEST - 2 VIEW COMPARISON:  12/29/2008 FINDINGS: Mild cardiomegaly. Diffuse bilateral heterogeneous airspace opacity, most conspicuous in the right lung base. The visualized skeletal structures are unremarkable. IMPRESSION: Diffuse bilateral heterogeneous airspace opacity, most conspicuous in the right lung base and consistent with multifocal infection, including COVID-19 if clinically suspected. Electronically Signed   By: Lauralyn Primes M.D.   On: 04/20/2019 16:39   CT Angio Chest PE W and/or Wo Contrast  Result Date: 04/21/2019 CLINICAL DATA:  Shortness of breath. Recent diagnosis of pneumonia. EXAM: CT ANGIOGRAPHY CHEST WITH CONTRAST TECHNIQUE: Multidetector CT imaging of the chest was performed using the standard protocol during bolus administration of intravenous contrast. Multiplanar CT image reconstructions and MIPs were obtained to evaluate the vascular anatomy. CONTRAST:  75mL OMNIPAQUE IOHEXOL 350 MG/ML SOLN COMPARISON:  Radiographs yesterday. Chest CTA 07/12/2011 FINDINGS: Cardiovascular: There are no filling defects within the pulmonary arteries to suggest pulmonary embolus. Evaluation is diagnostic to the segmental level. Cannot assess subsegmental branches given contrast bolus timing and soft tissue attenuation related to habitus. No aortic dissection. Heart is normal in size. No pericardial effusion. Mediastinum/Nodes: Small mediastinal lymph nodes not enlarged by size criteria. Highest mediastinal node measures 9 mm. No enlarged hilar lymph nodes. Esophagus is decompressed. No visualized thyroid nodule. Prominent right epicardial fat pad. Lungs/Pleura: Ground-glass heterogeneity throughout both lungs, some of which appear geographic. There superimposed linear opacities in the right lower, right upper, and left upper lobes. There is central bronchial  thickening. No pleural effusion. Upper Abdomen: Enlarged liver is partially included. No acute findings. Musculoskeletal: Dextroscoliotic curvature of the spine. There are no acute or suspicious osseous abnormalities. Review of the MIP images confirms the above findings. IMPRESSION: 1. No pulmonary embolus to the segmental level. 2. Ground-glass heterogeneity throughout both lungs, some of which appear geographic. This may be secondary to atypical infection, including COVID-19, however is not classic. Pulmonary edema is considered but felt less likely given normal heart size. Superimposed linear opacities in the right lower, right upper and left upper lobes but likely represent atelectasis. 3. Mild central bronchial thickening, can be seen with bronchitis or reactive airways disease. Electronically Signed   By: Narda Rutherford M.D.   On: 04/21/2019 02:38   DG Chest Port 1 View  Result Date: 04/20/2019 CLINICAL DATA:  Shortness of breath over the past few days. EXAM: PORTABLE CHEST 1 VIEW COMPARISON:  PA and lateral chest 04/20/2019 12/29/2008. FINDINGS: Hazy airspace disease in the right lung base is seen. Left lung is clear. Heart size is upper normal. No pneumothorax or pleural effusion. The exam is limited by the patient's body habitus and portable technique. IMPRESSION: Limited examination. Hazy basilar opacity on the right could be artifactual but could  represent pneumonia. Electronically Signed   By: Drusilla Kannerhomas  Dalessio M.D.   On: 04/20/2019 21:12    EKG: Independently reviewed. Sinus tachycardia, rate 128.   Assessment/Plan   1. Acute respiratory failure with hypoxia; suspected pneumonia  - Presents with 1 week of productive cough and SOB, found to be tachycardic and tachypneic with leukocytosis, mildly elevated procalcitonin, non-specific CXR findings, and negative COVID-19 testing x2  - Blood culture collected in ED and empiric antibiotics started  - Check sputum culture, check strep pnuemo and  legionella antigens, trend procalcitonin, check BNP, continue Rocephin and azithromycin pending cultures and clinical course    2. Hypertensive urgency  - BP as high as 185/110 in ED  - No documented hx of HTN, situational anxiety likely contributing - Labetalol IVP's as needed   3. Developmental delay  - Continue Abilify for associated behavioral disturbance    4. Hyperglycemia  - Serum glucose 212 in ED  - Check A1c, monitor CBG's and use a low-intensity SSI with Novolog as needed    DVT prophylaxis: Lovenox  Code Status: Full  Family Communication: Family updated at bedside Consults called: none  Admission status: Inpatient. Patient has new 4 Lpm supplemental O2 requirement while at rest, likely secondary to PNA though clinical uncertainty remains, and it is not expected that she can be safely evaluated and managed within observation timeframe.    Briscoe Deutscherimothy S Willis Holquin, MD [facility] Pager [phone]  If 7PM-7AM, please contact night-coverage [email or web address] Password [email or web address]  04/21/2019, 4:55 AM

## 2019-04-21 NOTE — ED Notes (Signed)
Patient transported to CT scan . 

## 2019-04-22 LAB — CBC
HCT: 47.4 % — ABNORMAL HIGH (ref 36.0–46.0)
Hemoglobin: 13.6 g/dL (ref 12.0–15.0)
MCH: 23.1 pg — ABNORMAL LOW (ref 26.0–34.0)
MCHC: 28.7 g/dL — ABNORMAL LOW (ref 30.0–36.0)
MCV: 80.5 fL (ref 80.0–100.0)
Platelets: 174 K/uL (ref 150–400)
RBC: 5.89 MIL/uL — ABNORMAL HIGH (ref 3.87–5.11)
RDW: 21.6 % — ABNORMAL HIGH (ref 11.5–15.5)
WBC: 12.6 K/uL — ABNORMAL HIGH (ref 4.0–10.5)
nRBC: 0.2 % (ref 0.0–0.2)

## 2019-04-22 LAB — PROCALCITONIN: Procalcitonin: <0.1 ng/mL

## 2019-04-22 LAB — GLUCOSE, CAPILLARY
Glucose-Capillary: 132 mg/dL — ABNORMAL HIGH (ref 70–99)
Glucose-Capillary: 165 mg/dL — ABNORMAL HIGH (ref 70–99)
Glucose-Capillary: 103 mg/dL — ABNORMAL HIGH (ref 70–99)
Glucose-Capillary: 138 mg/dL — ABNORMAL HIGH (ref 70–99)

## 2019-04-22 LAB — NOVEL CORONAVIRUS, NAA (HOSP ORDER, SEND-OUT TO REF LAB; TAT 18-24 HRS): SARS-CoV-2, NAA: NOT DETECTED

## 2019-04-22 MED ORDER — IBUPROFEN 600 MG PO TABS
600.0000 mg | ORAL_TABLET | Freq: Four times a day (QID) | ORAL | Status: DC | PRN
  Administered 2019-04-22 – 2019-04-23 (×2): 600 mg via ORAL
  Filled 2019-04-22 (×2): qty 1

## 2019-04-22 NOTE — Progress Notes (Signed)
When checking pt's vital signs at 2350, pt O2 saturations were in the 60's on Room Air. Pt was placed on 2 liters of O2 nasal cannula and pt was unable to get sats to 90% without RN increasing O2 to 3 liters. RN placed continuous pulse ox on pt and will continue to monitor.

## 2019-04-22 NOTE — Progress Notes (Signed)
PROGRESS NOTE    Regina Wood  QMV:[id number] DOB: 01-16-1981 DOA: 04/20/2019 PCP: Regina NephewGreen, Edwin, MD     Brief Narrative:  Regina HarriesKimberly C Wood a 38 y.o.femalewith medical history significant fordevelopmental delay and obesity, now presenting to the emergency department for evaluation of productive cough and shortness of breath. Patient was accompanied by family member that assists with the history. She has had a productive cough for 6 or 7 days now, has appeared dyspneic, and has seemed to be worsening. They have not noted any lower extremity swelling and there is no history of DVT or PE. She was evaluated for the symptoms at an urgent care where she had chest x-ray with nonspecific findings, negative COVID-19 test, tachycardia, tachypnea, and new supplemental oxygen requirement that prompted her to be directed to the ED. CBC features leukocytosis to 14,600. COVID-19 testing is negative for the second time. Procalcitonin is 0.29. D-dimer was 0.60 and CTA chest was performed, negative for PE, but with nonspecific findings that could reflect viral infection or edema. Patient was started on Rocephin and azithromycin and patient admitted to hospital for further treatment.   New events last 24 hours / Subjective: Patient desatted overnight and required 3 L nasal cannula O2.  On examination today, patient appears stable, denies any chest pain or respiratory distress.  Complains of a headache this morning, unrelieved with Tylenol.  Mother is at bedside.  Assessment & Plan:   Principal Problem:   Acute respiratory failure with hypoxia (HCC) Active Problems:   Pneumonia   Hyperglycemia   Hypertensive urgency   Developmental delay   Acute hypoxemic respiratory failure secondary to CAP -CTA chest: Ground-glass heterogeneity throughout both lungs, some of which appear geographic. This may be secondary to atypical infection, including COVID-19, however is not classic. Pulmonary edema is  considered but felt less likely given normal heart size. Superimposed linear opacities in the right lower, right upper and left upper lobes but likely represent atelectasis. -Required 4L [place] O2 at time of admission.  Now on 3 L nasal cannula O2.  Continue to wean.  Home desat screening ordered   Sepsis secondary to CAP, Sepsis POA with tachycardia, tachypnea, respiratory failure  -COVID negative x2, flu negative  -Continue rocephin, azithromycin -WBC improving, afebrile  -Blood cultures pending   Hypertensive urgency -BP improved  Developmental delay -Continue Abilify, cogentin   DM type 2, uncontrolled with hyperglycemia -Ha1c 8.7 -Lantus, Novolog SSI   DVT prophylaxis: Lovenox Code Status: Full code Family Communication: Mother at bedside Disposition Plan: Pending clinical improvement and weaning off of oxygen.  Home desat screening ordered   Consultants:   None  Procedures:   None  Antimicrobials:  Anti-infectives (From admission, onward)   Start     Dose/Rate Route Frequency Ordered Stop   04/22/19 2200  azithromycin (ZITHROMAX) 500 mg in sodium chloride 0.9 % 250 mL IVPB     500 mg 250 mL/hr over 60 Minutes Intravenous Every 24 hours 04/21/19 0454 04/26/19 2159   04/22/19 0000  cefTRIAXone (ROCEPHIN) 2 g in sodium chloride 0.9 % 100 mL IVPB     2 g 200 mL/hr over 30 Minutes Intravenous Every 24 hours 04/21/19 0454 04/25/19 2359   04/21/19 0030  cefTRIAXone (ROCEPHIN) 1 g in sodium chloride 0.9 % 100 mL IVPB     1 g 200 mL/hr over 30 Minutes Intravenous  Once 04/21/19 0015 04/21/19 0127   04/21/19 0030  azithromycin (ZITHROMAX) tablet 500 mg     500 mg Oral  Once 04/21/19 0015 04/21/19 0024        Objective: Vitals:   04/21/19 1617 04/21/19 2350 04/22/19 0500 04/22/19 0756  BP: (!) 142/82 138/87  114/63  Pulse: (!) 104   94  Resp:  18  (!) 22  Temp:  98.3 F (36.8 C)  98 F (36.7 C)  TempSrc:  Oral  Oral  SpO2: 96% 91% 96% 96%  Weight:        Height:        Intake/Output Summary (Last 24 hours) at 04/22/2019 1014 Last data filed at 04/22/2019 0025 Gross per 24 hour  Intake 806 ml  Output --  Net 806 ml   Filed Weights   04/21/19 0444  Weight: 129.4 kg    Examination:  General exam: Appears calm and comfortable  Respiratory system: Diminished breath sounds, respiratory effort normal, no respiratory distress or conversational dyspnea Cardiovascular system: S1 & S2 heard, tachycardic, regular rhythm, heart rate 102. No murmurs. No pedal edema. Gastrointestinal system: Abdomen is nondistended, soft and nontender. Normal bowel sounds heard. Central nervous system: Alert and oriented. No focal neurological deficits. Speech clear.  Extremities: Symmetric in appearance  Skin: No rashes, lesions or ulcers on exposed skin  Psychiatry: Stable, baseline developmental delay  Data Reviewed: I have personally reviewed following labs and imaging studies  CBC: Recent Labs  Lab 04/20/19 2340 04/21/19 0610 04/22/19 0315  WBC 14.6* 13.4* 12.6*  NEUTROABS 11.7* 11.4*  --   HGB 14.4 13.7 13.6  HCT 48.4* 46.4* 47.4*  MCV 77.7* 78.2* 80.5  PLT 202 183 250   Basic Metabolic Panel: Recent Labs  Lab 04/20/19 2340 04/21/19 0610  NA 141 140  K 3.9 4.1  CL 98 99  CO2 31 31  GLUCOSE 212* 280*  BUN 5* <5*  CREATININE 0.71 0.80  CALCIUM 8.9 8.6*   GFR: Estimated Creatinine Clearance: 126.5 mL/min (by C-G formula based on SCr of 0.8 mg/dL). Liver Function Tests: Recent Labs  Lab 04/20/19 2340  AST 24  ALT 41  ALKPHOS 112  BILITOT 1.6*  PROT 6.9  ALBUMIN 3.1*   No results for input(s): LIPASE, AMYLASE in the last 168 hours. No results for input(s): AMMONIA in the last 168 hours. Coagulation Profile: No results for input(s): INR, PROTIME in the last 168 hours. Cardiac Enzymes: No results for input(s): CKTOTAL, CKMB, CKMBINDEX, TROPONINI in the last 168 hours. BNP (last 3 results) No results for input(s): PROBNP in  the last 8760 hours. HbA1C: Recent Labs    04/21/19 0610  HGBA1C 8.7*   CBG: Recent Labs  Lab 04/21/19 0751 04/21/19 1124 04/21/19 1511 04/21/19 2108 04/22/19 0759  GLUCAP 207* 139* 157* 147* 132*   Lipid Profile: Recent Labs    04/20/19 2341  TRIG 104   Thyroid Function Tests: No results for input(s): TSH, T4TOTAL, FREET4, T3FREE, THYROIDAB in the last 72 hours. Anemia Panel: Recent Labs    04/20/19 2340  FERRITIN 25   Sepsis Labs: Recent Labs  Lab 04/20/19 2130 04/20/19 2338 04/20/19 2340 04/22/19 0315  PROCALCITON  --   --  0.29 <0.10  LATICACIDVEN 1.8 1.8  --   --     Recent Results (from the past 240 hour(s))  Novel Coronavirus, NAA (Hosp order, Send-out to Ref Lab; TAT 18-24 hrs     Status: None   Collection Time: 04/20/19  2:41 PM   Specimen: Nasopharyngeal Swab; Respiratory  Result Value Ref Range Status   SARS-CoV-2, NAA NOT DETECTED NOT DETECTED Final  Comment: (NOTE) This nucleic acid amplification test was developed and its performance characteristics determined by [employer]. Nucleic acid amplification tests include PCR and TMA. This test has not been FDA cleared or approved. This test has been authorized by FDA under an Emergency Use Authorization (EUA). This test is only authorized for the duration of time the declaration that circumstances exist justifying the authorization of the emergency use of in vitro diagnostic tests for detection of SARS-CoV-2 virus and/or diagnosis of COVID-19 infection under section 564(b)(1) of the Act, 21 U.S.C. 854OEV-0(J) (1), unless the authorization is terminated or revoked sooner. When diagnostic testing is negative, the possibility of a false negative result should be considered in the context of a patient's recent exposures and the presence of clinical signs and symptoms consistent with COVID-19. An individual without symptoms of COVID- 19 and who is not shedding SARS-CoV-2 vi rus would  expect to have a negative (not detected) result in this assay. Performed At: [facility] [address] [place], [place] [id number] Jolene Schimke MD XH:[id number]    Coronavirus Source NASOPHARYNGEAL  Final    Comment: Performed at [facility], [address] [place], [place] [address]  Respiratory Panel by RT PCR (Flu A&B, Covid) - Nasopharyngeal Swab     Status: None   Collection Time: 04/20/19  8:33 PM   Specimen: Nasopharyngeal Swab  Result Value Ref Range Status   SARS Coronavirus 2 by RT PCR NEGATIVE NEGATIVE Final    Comment: (NOTE) SARS-CoV-2 target nucleic acids are NOT DETECTED. The SARS-CoV-2 RNA is generally detectable in upper respiratoy specimens during the acute phase of infection. The lowest concentration of SARS-CoV-2 viral copies this assay can detect is 131 copies/mL. A negative result does not preclude SARS-Cov-2 infection and should not be used as the sole basis for treatment or other patient management decisions. A negative result may occur with  improper specimen collection/handling, submission of specimen other than nasopharyngeal swab, presence of viral mutation(s) within the areas targeted by this assay, and inadequate number of viral copies (<131 copies/mL). A negative result must be combined with clinical observations, patient history, and epidemiological information. The expected result is Negative. Fact Sheet for Patients:  https://www.moore.com/ Fact Sheet for Healthcare Providers:  https://www.young.biz/ This test is not yet ap proved or cleared by the [place] FDA and  has been authorized for detection and/or diagnosis of SARS-CoV-2 by FDA under an Emergency Use Authorization (EUA). This EUA will remain  in effect (meaning this test can be used) for the duration of the COVID-19 declaration under Section 564(b)(1) of the Act, 21 U.S.C. section 360bbb-3(b)(1), unless the authorization is  terminated or revoked sooner.    Influenza A by PCR NEGATIVE NEGATIVE Final   Influenza B by PCR NEGATIVE NEGATIVE Final    Comment: (NOTE) The Xpert Xpress SARS-CoV-2/FLU/RSV assay is intended as an aid in  the diagnosis of influenza from Nasopharyngeal swab specimens and  should not be used as a sole basis for treatment. Nasal washings and  aspirates are unacceptable for Xpert Xpress SARS-CoV-2/FLU/RSV  testing. Fact Sheet for Patients: https://www.moore.com/ Fact Sheet for Healthcare Providers: https://www.young.biz/ This test is not yet approved or cleared by the [place] FDA and  has been authorized for detection and/or diagnosis of SARS-CoV-2 by  FDA under an Emergency Use Authorization (EUA). This EUA will remain  in effect (meaning this test can be used) for the duration of the  Covid-19 declaration under Section 564(b)(1) of the Act, 21  U.S.C. section 360bbb-3(b)(1), unless the authorization is  terminated or revoked. Performed at [facility], [address], [place], [place] [address]       Radiology Studies: DG Chest 2 View  Result Date: 04/20/2019 CLINICAL DATA:  Cough, fatigue, query COVID EXAM: CHEST - 2 VIEW COMPARISON:  12/29/2008 FINDINGS: Mild cardiomegaly. Diffuse bilateral heterogeneous airspace opacity, most conspicuous in the right lung base. The visualized skeletal structures are unremarkable. IMPRESSION: Diffuse bilateral heterogeneous airspace opacity, most conspicuous in the right lung base and consistent with multifocal infection, including COVID-19 if clinically suspected. Electronically Signed   By: Lauralyn Primes M.D.   On: 04/20/2019 16:39   CT Angio Chest PE W and/or Wo Contrast  Result Date: 04/21/2019 CLINICAL DATA:  Shortness of breath. Recent diagnosis of pneumonia. EXAM: CT ANGIOGRAPHY CHEST WITH CONTRAST TECHNIQUE: Multidetector CT imaging of the chest was performed using the standard protocol  during bolus administration of intravenous contrast. Multiplanar CT image reconstructions and MIPs were obtained to evaluate the vascular anatomy. CONTRAST:  72mL OMNIPAQUE IOHEXOL 350 MG/ML SOLN COMPARISON:  Radiographs yesterday. Chest CTA 07/12/2011 FINDINGS: Cardiovascular: There are no filling defects within the pulmonary arteries to suggest pulmonary embolus. Evaluation is diagnostic to the segmental level. Cannot assess subsegmental branches given contrast bolus timing and soft tissue attenuation related to habitus. No aortic dissection. Heart is normal in size. No pericardial effusion. Mediastinum/Nodes: Small mediastinal lymph nodes not enlarged by size criteria. Highest mediastinal node measures 9 mm. No enlarged hilar lymph nodes. Esophagus is decompressed. No visualized thyroid nodule. Prominent right epicardial fat pad. Lungs/Pleura: Ground-glass heterogeneity throughout both lungs, some of which appear geographic. There superimposed linear opacities in the right lower, right upper, and left upper lobes. There is central bronchial thickening. No pleural effusion. Upper Abdomen: Enlarged liver is partially included. No acute findings. Musculoskeletal: Dextroscoliotic curvature of the spine. There are no acute or suspicious osseous abnormalities. Review of the MIP images confirms the above findings. IMPRESSION: 1. No pulmonary embolus to the segmental level. 2. Ground-glass heterogeneity throughout both lungs, some of which appear geographic. This may be secondary to atypical infection, including COVID-19, however is not classic. Pulmonary edema is considered but felt less likely given normal heart size. Superimposed linear opacities in the right lower, right upper and left upper lobes but likely represent atelectasis. 3. Mild central bronchial thickening, can be seen with bronchitis or reactive airways disease. Electronically Signed   By: Narda Rutherford M.D.   On: 04/21/2019 02:38   DG Chest Port 1  View  Result Date: 04/20/2019 CLINICAL DATA:  Shortness of breath over the past few days. EXAM: PORTABLE CHEST 1 VIEW COMPARISON:  PA and lateral chest 04/20/2019 12/29/2008. FINDINGS: Hazy airspace disease in the right lung base is seen. Left lung is clear. Heart size is upper normal. No pneumothorax or pleural effusion. The exam is limited by the patient's body habitus and portable technique. IMPRESSION: Limited examination. Hazy basilar opacity on the right could be artifactual but could represent pneumonia. Electronically Signed   By: Drusilla Kanner M.D.   On: 04/20/2019 21:12      Scheduled Meds:  ARIPiprazole  10 mg Oral Daily   benztropine  1 mg Oral Daily   enoxaparin (LOVENOX) injection  60 mg Subcutaneous Q24H   insulin aspart  0-9 Units Subcutaneous TID WC   insulin glargine  15 Units Subcutaneous Daily   sodium chloride flush  3 mL Intravenous Q12H   sodium chloride flush  3 mL  Intravenous Q12H   Continuous Infusions:  sodium chloride     azithromycin     cefTRIAXone (ROCEPHIN)  IV 2 g (04/21/19 2355)     LOS: 1 day      Time spent: 25 minutes   Noralee Stain, DO [facility] 04/22/2019, 10:14 AM   Available via Epic secure chat 7am-7pm After these hours, please refer to coverage provider listed on [email or web address]

## 2019-04-23 LAB — GLUCOSE, CAPILLARY
Glucose-Capillary: 156 mg/dL — ABNORMAL HIGH (ref 70–99)
Glucose-Capillary: 146 mg/dL — ABNORMAL HIGH (ref 70–99)
Glucose-Capillary: 99 mg/dL (ref 70–99)
Glucose-Capillary: 122 mg/dL — ABNORMAL HIGH (ref 70–99)

## 2019-04-23 LAB — CBC
HCT: 46.3 % — ABNORMAL HIGH (ref 36.0–46.0)
Hemoglobin: 13.2 g/dL (ref 12.0–15.0)
MCH: 23.2 pg — ABNORMAL LOW (ref 26.0–34.0)
MCHC: 28.5 g/dL — ABNORMAL LOW (ref 30.0–36.0)
MCV: 81.5 fL (ref 80.0–100.0)
Platelets: 192 10*3/uL (ref 150–400)
RBC: 5.68 MIL/uL — ABNORMAL HIGH (ref 3.87–5.11)
RDW: 21.4 % — ABNORMAL HIGH (ref 11.5–15.5)
WBC: 12.9 10*3/uL — ABNORMAL HIGH (ref 4.0–10.5)
nRBC: 0 % (ref 0.0–0.2)

## 2019-04-23 LAB — PROCALCITONIN: Procalcitonin: <0.1 ng/mL

## 2019-04-23 NOTE — Progress Notes (Signed)
PROGRESS NOTE    Regina Wood  FTD:[id number] DOB: [birth year] DOA: 04/20/2019 PCP: Levin Erp, MD     Brief Narrative:  Regina Wood a 38 y.o.femalewith medical history significant fordevelopmental delay and obesity, now presenting to the emergency department for evaluation of productive cough and shortness of breath. Patient was accompanied by family member that assists with the history. She has had a productive cough for 6 or 7 days now, has appeared dyspneic, and has seemed to be worsening. They have not noted any lower extremity swelling and there is no history of DVT or PE. She was evaluated for the symptoms at an urgent care where she had chest x-ray with nonspecific findings, negative COVID-19 test, tachycardia, tachypnea, and new supplemental oxygen requirement that prompted her to be directed to the ED. CBC features leukocytosis to 14,600. COVID-19 testing is negative for the second time. Procalcitonin is 0.29. D-dimer was 0.60 and CTA chest was performed, negative for PE, but with nonspecific findings that could reflect viral infection or edema. Patient was started on Rocephin and azithromycin and patient admitted to hospital for further treatment.   New events last 24 hours / Subjective: Sleeping comfortably, no acute events overnight.  Home O2 desat screening today.  Assessment & Plan:   Principal Problem:   Acute respiratory failure with hypoxia (HCC) Active Problems:   Pneumonia   Hyperglycemia   Hypertensive urgency   Developmental delay   Acute hypoxemic respiratory failure secondary to CAP -CTA chest: Ground-glass heterogeneity throughout both lungs, some of which appear geographic. This may be secondary to atypical infection, including COVID-19, however is not classic. Pulmonary edema is considered but felt less likely given normal heart size. Superimposed linear opacities in the right lower, right upper and left upper lobes but likely represent  atelectasis. -Required 4L [place] O2 at time of admission.  Now on 3 L nasal cannula O2.  Continue to wean.  Home desat screening shows that patient does require oxygen on discharge.  DME ordered.  Sepsis secondary to CAP, Sepsis POA with tachycardia, tachypnea, respiratory failure  -COVID negative x2, flu negative  -Continue rocephin, azithromycin -Afebrile overnight -Blood cultures negative to date   Hypertensive urgency -BP stable   Developmental delay -Continue Abilify, cogentin   DM type 2, uncontrolled with hyperglycemia -Ha1c 8.7 -Lantus, Novolog SSI   DVT prophylaxis: Lovenox Code Status: Full code Family Communication: Mother at bedside Disposition Plan: Pending clinical improvement. Home O2 ordered.    Consultants:   None  Procedures:   None  Antimicrobials:  Anti-infectives (From admission, onward)   Start     Dose/Rate Route Frequency Ordered Stop   04/22/19 2200  azithromycin (ZITHROMAX) 500 mg in sodium chloride 0.9 % 250 mL IVPB     500 mg 250 mL/hr over 60 Minutes Intravenous Every 24 hours 04/21/19 0454 04/26/19 2159   04/22/19 0000  cefTRIAXone (ROCEPHIN) 2 g in sodium chloride 0.9 % 100 mL IVPB     2 g 200 mL/hr over 30 Minutes Intravenous Every 24 hours 04/21/19 0454 04/25/19 2359   04/21/19 0030  cefTRIAXone (ROCEPHIN) 1 g in sodium chloride 0.9 % 100 mL IVPB     1 g 200 mL/hr over 30 Minutes Intravenous  Once 04/21/19 0015 04/21/19 0127   04/21/19 0030  azithromycin (ZITHROMAX) tablet 500 mg     500 mg Oral  Once 04/21/19 0015 04/21/19 0024       Objective: Vitals:   04/22/19 0756 04/22/19 1710 04/22/19 2300 04/23/19 0623  BP: 114/63 (!) 139/93 130/72 123/63  Pulse: 94 93  (!) 110  Resp: (!) 22 20    Temp: 98 F (36.7 C) 98.8 F (37.1 C) 98.5 F (36.9 C) 99.8 F (37.7 C)  TempSrc: Oral Oral Axillary Oral  SpO2: 96% 94% 94% 92%  Weight:      Height:        Intake/Output Summary (Last 24 hours) at 04/23/2019 1133 Last data filed  at 04/23/2019 0559 Gross per 24 hour  Intake 366.22 ml  Output --  Net 366.22 ml   Filed Weights   04/21/19 0444  Weight: 129.4 kg    Examination: General exam: Appears calm and comfortable  Respiratory system: Clear to auscultation. Respiratory effort normal. Cardiovascular system: S1 & S2 heard, RRR. No pedal edema. Gastrointestinal system: Abdomen is nondistended, soft and nontender. Normal bowel sounds heard. Central nervous system: Alert  Extremities: Symmetric in appearance bilaterally  Skin: No rashes, lesions or ulcers on exposed skin  Psychiatry: Stable, baseline developmental delay   Data Reviewed: I have personally reviewed following labs and imaging studies  CBC: Recent Labs  Lab 04/20/19 2340 04/21/19 0610 04/22/19 0315 04/23/19 0256  WBC 14.6* 13.4* 12.6* 12.9*  NEUTROABS 11.7* 11.4*  --   --   HGB 14.4 13.7 13.6 13.2  HCT 48.4* 46.4* 47.4* 46.3*  MCV 77.7* 78.2* 80.5 81.5  PLT 202 183 174 192   Basic Metabolic Panel: Recent Labs  Lab 04/20/19 2340 04/21/19 0610  NA 141 140  K 3.9 4.1  CL 98 99  CO2 31 31  GLUCOSE 212* 280*  BUN 5* <5*  CREATININE 0.71 0.80  CALCIUM 8.9 8.6*   GFR: Estimated Creatinine Clearance: 126.5 mL/min (by C-G formula based on SCr of 0.8 mg/dL). Liver Function Tests: Recent Labs  Lab 04/20/19 2340  AST 24  ALT 41  ALKPHOS 112  BILITOT 1.6*  PROT 6.9  ALBUMIN 3.1*   No results for input(s): LIPASE, AMYLASE in the last 168 hours. No results for input(s): AMMONIA in the last 168 hours. Coagulation Profile: No results for input(s): INR, PROTIME in the last 168 hours. Cardiac Enzymes: No results for input(s): CKTOTAL, CKMB, CKMBINDEX, TROPONINI in the last 168 hours. BNP (last 3 results) No results for input(s): PROBNP in the last 8760 hours. HbA1C: Recent Labs    04/21/19 0610  HGBA1C 8.7*   CBG: Recent Labs  Lab 04/22/19 0759 04/22/19 1119 04/22/19 1712 04/22/19 2119 04/23/19 0724  GLUCAP 132*  165* 103* 138* 156*   Lipid Profile: Recent Labs    04/20/19 2341  TRIG 104   Thyroid Function Tests: No results for input(s): TSH, T4TOTAL, FREET4, T3FREE, THYROIDAB in the last 72 hours. Anemia Panel: Recent Labs    04/20/19 2340  FERRITIN 25   Sepsis Labs: Recent Labs  Lab 04/20/19 2130 04/20/19 2338 04/20/19 2340 04/22/19 0315 04/23/19 0256  PROCALCITON  --   --  0.29 <0.10 <0.10  LATICACIDVEN 1.8 1.8  --   --   --     Recent Results (from the past 240 hour(s))  Novel Coronavirus, NAA (Hosp order, Send-out to Ref Lab; TAT 18-24 hrs     Status: None   Collection Time: 04/20/19  2:41 PM   Specimen: Nasopharyngeal Swab; Respiratory  Result Value Ref Range Status   SARS-CoV-2, NAA NOT DETECTED NOT DETECTED Final    Comment: (NOTE) This nucleic acid amplification test was developed and its performance characteristics determined by [employer]. Nucleic acid amplification tests  include PCR and TMA. This test has not been FDA cleared or approved. This test has been authorized by FDA under an Emergency Use Authorization (EUA). This test is only authorized for the duration of time the declaration that circumstances exist justifying the authorization of the emergency use of in vitro diagnostic tests for detection of SARS-CoV-2 virus and/or diagnosis of COVID-19 infection under section 564(b)(1) of the Act, 21 U.S.C. 326ZTI-4(P) (1), unless the authorization is terminated or revoked sooner. When diagnostic testing is negative, the possibility of a false negative result should be considered in the context of a patient's recent exposures and the presence of clinical signs and symptoms consistent with COVID-19. An individual without symptoms of COVID- 19 and who is not shedding SARS-CoV-2 vi rus would expect to have a negative (not detected) result in this assay. Performed At: [facility] [address] [place], [place] [id number] Jolene Schimke MD  NK:[id number]    Coronavirus Source NASOPHARYNGEAL  Final    Comment: Performed at [facility], [address], [place], [place] [address]  Respiratory Panel by RT PCR (Flu A&B, Covid) - Nasopharyngeal Swab     Status: None   Collection Time: 04/20/19  8:33 PM   Specimen: Nasopharyngeal Swab  Result Value Ref Range Status   SARS Coronavirus 2 by RT PCR NEGATIVE NEGATIVE Final    Comment: (NOTE) SARS-CoV-2 target nucleic acids are NOT DETECTED. The SARS-CoV-2 RNA is generally detectable in upper respiratoy specimens during the acute phase of infection. The lowest concentration of SARS-CoV-2 viral copies this assay can detect is 131 copies/mL. A negative result does not preclude SARS-Cov-2 infection and should not be used as the sole basis for treatment or other patient management decisions. A negative result may occur with  improper specimen collection/handling, submission of specimen other than nasopharyngeal swab, presence of viral mutation(s) within the areas targeted by this assay, and inadequate number of viral copies (<131 copies/mL). A negative result must be combined with clinical observations, patient history, and epidemiological information. The expected result is Negative. Fact Sheet for Patients:  https://www.moore.com/ Fact Sheet for Healthcare Providers:  https://www.young.biz/ This test is not yet ap proved or cleared by the [place] FDA and  has been authorized for detection and/or diagnosis of SARS-CoV-2 by FDA under an Emergency Use Authorization (EUA). This EUA will remain  in effect (meaning this test can be used) for the duration of the COVID-19 declaration under Section 564(b)(1) of the Act, 21 U.S.C. section 360bbb-3(b)(1), unless the authorization is terminated or revoked sooner.    Influenza A by PCR NEGATIVE NEGATIVE Final   Influenza B by PCR NEGATIVE NEGATIVE Final    Comment: (NOTE) The Xpert Xpress  SARS-CoV-2/FLU/RSV assay is intended as an aid in  the diagnosis of influenza from Nasopharyngeal swab specimens and  should not be used as a sole basis for treatment. Nasal washings and  aspirates are unacceptable for Xpert Xpress SARS-CoV-2/FLU/RSV  testing. Fact Sheet for Patients: https://www.moore.com/ Fact Sheet for Healthcare Providers: https://www.young.biz/ This test is not yet approved or cleared by the [place] FDA and  has been authorized for detection and/or diagnosis of SARS-CoV-2 by  FDA under an Emergency Use Authorization (EUA). This EUA will remain  in effect (meaning this test can be used) for the duration of the  Covid-19 declaration under Section 564(b)(1) of the Act, 21  U.S.C. section 360bbb-3(b)(1), unless the authorization is  terminated or revoked. Performed at [facility], [address]  [place], [place] [address]   Blood Culture (routine x 2)     Status: None (Preliminary result)   Collection Time: 04/20/19 11:30 PM   Specimen: BLOOD  Result Value Ref Range Status   Specimen Description BLOOD RIGHT ANTECUBITAL  Final   Special Requests   Final    BOTTLES DRAWN AEROBIC AND ANAEROBIC Blood Culture adequate volume   Culture   Final    NO GROWTH 1 DAY Performed at [facility], [address] [place], [place] [address]    Report Status PENDING  Incomplete  Blood Culture (routine x 2)     Status: None (Preliminary result)   Collection Time: 04/21/19  6:10 AM   Specimen: BLOOD LEFT HAND  Result Value Ref Range Status   Specimen Description BLOOD LEFT HAND  Final   Special Requests   Final    BOTTLES DRAWN AEROBIC AND ANAEROBIC Blood Culture adequate volume   Culture   Final    NO GROWTH 1 DAY Performed at [facility], [address] [place], [place] [address]    Report Status PENDING  Incomplete      Radiology Studies: No results found.    Scheduled Meds: . ARIPiprazole  10 mg  Oral Daily  . benztropine  1 mg Oral Daily  . enoxaparin (LOVENOX) injection  60 mg Subcutaneous Q24H  . insulin aspart  0-9 Units Subcutaneous TID WC  . insulin glargine  15 Units Subcutaneous Daily  . sodium chloride flush  3 mL Intravenous Q12H  . sodium chloride flush  3 mL Intravenous Q12H   Continuous Infusions: . sodium chloride    . azithromycin Stopped (04/22/19 2219)  . cefTRIAXone (ROCEPHIN)  IV 2 g (04/23/19 0012)     LOS: 2 days      Time spent: 25 minutes   Noralee StainJennifer Jayln Madeira, DO [facility] 04/23/2019, 11:33 AM   Available via Epic secure chat 7am-7pm After these hours, please refer to coverage provider listed on [email or web address]

## 2019-04-23 NOTE — Evaluation (Signed)
SATURATION QUALIFICATIONS: (This note is used to comply with regulatory documentation for home oxygen)  Patient Saturations on Room Air at Rest =81%  Patient Saturations on Room Air while Ambulating = 78%  Patient Saturations on 3 Liters of oxygen while Ambulating = 94%  Please briefly explain why patient needs home oxygen: Patient desaturated below 80%

## 2019-04-23 NOTE — Care Management Important Message (Signed)
Important Message  Patient Details  Name: Regina Wood MRN: [id number] Date of Birth: Oct 01, 1980   Medicare Important Message Given:  Yes     Aum Caggiano 04/23/2019, 2:45 PM

## 2019-04-24 LAB — CBC
HCT: 45.4 % (ref 36.0–46.0)
Hemoglobin: 13 g/dL (ref 12.0–15.0)
MCH: 23.2 pg — ABNORMAL LOW (ref 26.0–34.0)
MCHC: 28.6 g/dL — ABNORMAL LOW (ref 30.0–36.0)
MCV: 81.1 fL (ref 80.0–100.0)
Platelets: 194 10*3/uL (ref 150–400)
RBC: 5.6 MIL/uL — ABNORMAL HIGH (ref 3.87–5.11)
RDW: 20.8 % — ABNORMAL HIGH (ref 11.5–15.5)
WBC: 12 10*3/uL — ABNORMAL HIGH (ref 4.0–10.5)
nRBC: 0 % (ref 0.0–0.2)

## 2019-04-24 LAB — GLUCOSE, CAPILLARY
Glucose-Capillary: 130 mg/dL — ABNORMAL HIGH (ref 70–99)
Glucose-Capillary: 150 mg/dL — ABNORMAL HIGH (ref 70–99)

## 2019-04-24 MED ORDER — CEFDINIR 300 MG PO CAPS: 300.0000 mg | ORAL_CAPSULE | Freq: Two times a day (BID) | ORAL | 0 refills | Status: AC

## 2019-04-24 MED ORDER — BLOOD GLUCOSE METER KIT: PACK | 0 refills | Status: AC

## 2019-04-24 MED ORDER — METFORMIN HCL 500 MG PO TABS: 500.0000 mg | ORAL_TABLET | Freq: Two times a day (BID) | ORAL | 2 refills | Status: DC

## 2019-04-24 MED ORDER — BUTALBITAL-APAP-CAFFEINE 50-325-40 MG PO TABS
1.0000 | ORAL_TABLET | Freq: Four times a day (QID) | ORAL | Status: DC | PRN
  Administered 2019-04-24: 1 via ORAL
  Filled 2019-04-24: qty 1

## 2019-04-24 MED ORDER — ENOXAPARIN SODIUM 80 MG/0.8ML ~~LOC~~ SOLN: 65.0000 mg | SUBCUTANEOUS | Status: DC

## 2019-04-24 MED ORDER — AZITHROMYCIN 500 MG PO TABS: 500.0000 mg | ORAL_TABLET | Freq: Every day | ORAL | 0 refills | Status: AC

## 2019-04-24 NOTE — Discharge Summary (Signed)
Physician Discharge Summary  Regina Wood ALP:[id number] DOB: 12-Feb-1981 DOA: 04/20/2019  PCP: Levin Erp, MD  Admit date: 04/20/2019 Discharge date: 04/24/2019  Admitted From: Home Disposition:  Home   Recommendations for Outpatient Follow-up:  1. Follow up with PCP in 1 week. Follow up for uncontrolled diabetes  Equipment/Devices: Home O2    Discharge Condition: Stable CODE STATUS: Full  Diet recommendation: Heart healthy   Brief/Interim Summary: Regina Wood Haithis a 38 y.o.femalewith medical history significant fordevelopmental delay and obesity, now presenting to the emergency department for evaluation of productive cough and shortness of breath. Patientwasaccompanied by family member that assists with the history. She has had a productive cough for 6 or 7 days now, has appeared dyspneic, and has seemed to be worsening. They have not noted any lower extremity swelling and there is no history of DVT or PE. She was evaluated for the symptoms at an urgent care where she had chest x-ray with nonspecific findings, negative COVID-19 test, tachycardia, tachypnea, and new supplemental oxygen requirement that prompted her to be directed to the ED. CBC features leukocytosis to 14,600. COVID-19 testing is negative for the second time. Procalcitonin is 0.29. D-dimer was 0.60 and CTA chest was performed, negative for PE, but with nonspecific findings that could reflectviralinfection or edema. Patient was started on Rocephin and azithromycinand patient admitted to hospital for further treatment.  She continued to clinically improve, qualified for home oxygen for discharge home.  Discharge Diagnoses:  Principal Problem:   Acute respiratory failure with hypoxia (HCC) Active Problems:   Pneumonia   Hyperglycemia   Hypertensive urgency   Developmental delay   Acute hypoxemic respiratory failure secondary to CAP -CTA chest:Ground-glass heterogeneity throughout both lungs,  some of which appear geographic. This may be secondary to atypical infection, including COVID-19, however is not classic. Pulmonary edema is considered but felt less likely given normal heart size. Superimposed linear opacities in the right lower, right upper and left upper lobes but likely represent atelectasis. -Required 4L [place] O2 at time of admission.  Now on 3 L nasal cannula O2.  Qualifies for home O2.  Sepsis secondary to CAP, Sepsis POA with tachycardia, tachypnea, respiratory failure  -COVID negative x2, flu negative  -Continue rocephin, azithromycin -Afebrile overnight, WBC improving -Blood cultures negative to date  -Clinically improving   Hypertensive urgency -BP improved    Developmental delay -Continue Abilify, cogentin   DM type 2, uncontrolled with hyperglycemia -Ha1c 8.7 -Lantus, Novolog SSI during hospitalization  -We will prescribe Metformin on discharge   Discharge Instructions  Discharge Instructions    Call MD for:  difficulty breathing, headache or visual disturbances   Complete by: As directed    Call MD for:  extreme fatigue   Complete by: As directed    Call MD for:  persistant dizziness or light-headedness   Complete by: As directed    Call MD for:  persistant nausea and vomiting   Complete by: As directed    Call MD for:  severe uncontrolled pain   Complete by: As directed    Call MD for:  temperature >100.4   Complete by: As directed    Diet - low sodium heart healthy   Complete by: As directed    Discharge instructions   Complete by: As directed    You were cared for by a hospitalist during your hospital stay. If you have any questions about your discharge medications or the care you received while you were in the hospital after you  are discharged, you can call the unit and ask to speak with the hospitalist on call if the hospitalist that took care of you is not available. Once you are discharged, your primary care physician will handle any  further medical issues. Please note that NO REFILLS for any discharge medications will be authorized once you are discharged, as it is imperative that you return to your primary care physician (or establish a relationship with a primary care physician if you do not have one) for your aftercare needs so that they can reassess your need for medications and monitor your lab values.   Increase activity slowly   Complete by: As directed      Allergies as of 04/24/2019   No Known Allergies     Medication List    TAKE these medications   ARIPiprazole 10 MG tablet Commonly known as: ABILIFY Take 10 mg by mouth daily.   azithromycin 500 MG tablet Commonly known as: Zithromax Take 1 tablet (500 mg total) by mouth daily for 2 days.   benztropine 1 MG tablet Commonly known as: COGENTIN Take 1 mg by mouth daily.   blood glucose meter kit and supplies Dispense based on patient and insurance preference. Use up to four times daily as directed. (FOR ICD-10 E10.9, E11.9).   cefdinir 300 MG capsule Commonly known as: OMNICEF Take 1 capsule (300 mg total) by mouth 2 (two) times daily for 2 days.   metFORMIN 500 MG tablet Commonly known as: Glucophage Take 1 tablet (500 mg total) by mouth 2 (two) times daily with a meal.   naproxen sodium 550 MG tablet Commonly known as: ANAPROX Take 550 mg by mouth as needed.            Durable Medical Equipment  (From admission, onward)         Start     Ordered   04/23/19 1133  For home use only DME oxygen  Once    Question Answer Comment  Length of Need 6 Months   Mode or (Route) Nasal cannula   Liters per Minute 3   Oxygen delivery system Gas      04/23/19 1133         Follow-up Information    Levin Erp, MD. Schedule an appointment as soon as possible for a visit in 1 week(s).   Specialty: Internal Medicine Why: Follow up regarding hospitalization as well as diabetes management  Contact information: [address] [place]  [address] [place] [address] [phone]          No Known Allergies   Procedures/Studies: DG Chest 2 View  Result Date: 04/20/2019 CLINICAL DATA:  Cough, fatigue, query COVID EXAM: CHEST - 2 VIEW COMPARISON:  12/29/2008 FINDINGS: Mild cardiomegaly. Diffuse bilateral heterogeneous airspace opacity, most conspicuous in the right lung base. The visualized skeletal structures are unremarkable. IMPRESSION: Diffuse bilateral heterogeneous airspace opacity, most conspicuous in the right lung base and consistent with multifocal infection, including COVID-19 if clinically suspected. Electronically Signed   By: Eddie Candle M.D.   On: 04/20/2019 16:39   CT Angio Chest PE W and/or Wo Contrast  Result Date: 04/21/2019 CLINICAL DATA:  Shortness of breath. Recent diagnosis of pneumonia. EXAM: CT ANGIOGRAPHY CHEST WITH CONTRAST TECHNIQUE: Multidetector CT imaging of the chest was performed using the standard protocol during bolus administration of intravenous contrast. Multiplanar CT image reconstructions and MIPs were obtained to evaluate the vascular anatomy. CONTRAST:  8m OMNIPAQUE IOHEXOL 350 MG/ML SOLN COMPARISON:  Radiographs yesterday.  Chest CTA 07/12/2011 FINDINGS: Cardiovascular: There are no filling defects within the pulmonary arteries to suggest pulmonary embolus. Evaluation is diagnostic to the segmental level. Cannot assess subsegmental branches given contrast bolus timing and soft tissue attenuation related to habitus. No aortic dissection. Heart is normal in size. No pericardial effusion. Mediastinum/Nodes: Small mediastinal lymph nodes not enlarged by size criteria. Highest mediastinal node measures 9 mm. No enlarged hilar lymph nodes. Esophagus is decompressed. No visualized thyroid nodule. Prominent right epicardial fat pad. Lungs/Pleura: Ground-glass heterogeneity throughout both lungs, some of which appear geographic. There superimposed linear opacities in the right lower, right upper, and  left upper lobes. There is central bronchial thickening. No pleural effusion. Upper Abdomen: Enlarged liver is partially included. No acute findings. Musculoskeletal: Dextroscoliotic curvature of the spine. There are no acute or suspicious osseous abnormalities. Review of the MIP images confirms the above findings. IMPRESSION: 1. No pulmonary embolus to the segmental level. 2. Ground-glass heterogeneity throughout both lungs, some of which appear geographic. This may be secondary to atypical infection, including COVID-19, however is not classic. Pulmonary edema is considered but felt less likely given normal heart size. Superimposed linear opacities in the right lower, right upper and left upper lobes but likely represent atelectasis. 3. Mild central bronchial thickening, can be seen with bronchitis or reactive airways disease. Electronically Signed   By: Keith Rake M.D.   On: 04/21/2019 02:38   DG Chest Port 1 View  Result Date: 04/20/2019 CLINICAL DATA:  Shortness of breath over the past few days. EXAM: PORTABLE CHEST 1 VIEW COMPARISON:  PA and lateral chest 04/20/2019 12/29/2008. FINDINGS: Hazy airspace disease in the right lung base is seen. Left lung is clear. Heart size is upper normal. No pneumothorax or pleural effusion. The exam is limited by the patient's body habitus and portable technique. IMPRESSION: Limited examination. Hazy basilar opacity on the right could be artifactual but could represent pneumonia. Electronically Signed   By: Inge Rise M.D.   On: 04/20/2019 21:12       Discharge Exam: Vitals:   04/23/19 2320 04/24/19 0750  BP: (!) 167/99 (!) 150/93  Pulse: 86 80  Resp: 18 16  Temp: 97.8 F (36.6 C) 98 F (36.7 C)  SpO2: (!) 87% 98%     General: Pt is alert, awake, not in acute distress Cardiovascular: RRR, S1/S2 +, no edema Respiratory: CTA bilaterally, no wheezing, no rhonchi, no respiratory distress, no conversational dyspnea, on [place] O2  Abdominal: Soft,  NT, ND, bowel sounds + Extremities: no edema, no cyanosis Psych: Stable     The results of significant diagnostics from this hospitalization (including imaging, microbiology, ancillary and laboratory) are listed below for reference.     Microbiology: Recent Results (from the past 240 hour(s))  Novel Coronavirus, NAA (Hosp order, Send-out to Ref Lab; TAT 18-24 hrs     Status: None   Collection Time: 04/20/19  2:41 PM   Specimen: Nasopharyngeal Swab; Respiratory  Result Value Ref Range Status   SARS-CoV-2, NAA NOT DETECTED NOT DETECTED Final    Comment: (NOTE) This nucleic acid amplification test was developed and its performance characteristics determined by [employer], [employer] and [employer]. Nucleic acid amplification tests include PCR and TMA. This test has not been FDA cleared or approved. This test has been authorized by FDA under an Emergency Use Authorization (EUA). This test is only authorized for the duration of time the declaration that circumstances exist justifying the authorization of the emergency use of in vitro diagnostic tests for detection  of SARS-CoV-2 virus and/or diagnosis of COVID-19 infection under section 564(b)(1) of the Act, 21 U.S.C. 026VZC-5(Y) (1), unless the authorization is terminated or revoked sooner. When diagnostic testing is negative, the possibility of a false negative result should be considered in the context of a patient's recent exposures and the presence of clinical signs and symptoms consistent with COVID-19. An individual without symptoms of COVID- 19 and who is not shedding SARS-CoV-2 vi rus would expect to have a negative (not detected) result in this assay. Performed At: [facility] [address] [place], [place] [id number] Rush Farmer MD IN:[id number]    [place]  Final    Comment: Performed at [facility], [place] [address] [place], [place] [address]  Respiratory Panel by RT PCR (Flu A&B, Covid) -  Nasopharyngeal Swab     Status: None   Collection Time: 04/20/19  8:33 PM   Specimen: Nasopharyngeal Swab  Result Value Ref Range Status   SARS Coronavirus 2 by RT PCR NEGATIVE NEGATIVE Final    Comment: (NOTE) SARS-CoV-2 target nucleic acids are NOT DETECTED. The SARS-CoV-2 RNA is generally detectable in upper respiratoy specimens during the acute phase of infection. The lowest concentration of SARS-CoV-2 viral copies this assay can detect is 131 copies/mL. A negative result does not preclude SARS-Cov-2 infection and should not be used as the sole basis for treatment or other patient management decisions. A negative result may occur with  improper specimen collection/handling, submission of specimen other than nasopharyngeal swab, presence of viral mutation(s) within the areas targeted by this assay, and inadequate number of viral copies (<131 copies/mL). A negative result must be combined with clinical observations, patient history, and epidemiological information. The expected result is Negative. Fact Sheet for Patients:  PinkCheek.be Fact Sheet for Healthcare Providers:  GravelBags.it This test is not yet ap proved or cleared by the [place] FDA and  has been authorized for detection and/or diagnosis of SARS-CoV-2 by FDA under an Emergency Use Authorization (EUA). This EUA will remain  in effect (meaning this test can be used) for the duration of the COVID-19 declaration under Section 564(b)(1) of the Act, 21 U.S.C. section 360bbb-3(b)(1), unless the authorization is terminated or revoked sooner.    Influenza A by PCR NEGATIVE NEGATIVE Final   Influenza B by PCR NEGATIVE NEGATIVE Final    Comment: (NOTE) The Xpert Xpress SARS-CoV-2/FLU/RSV assay is intended as an aid in  the diagnosis of influenza from Nasopharyngeal swab specimens and  should not be used as a sole basis for treatment. Nasal washings and  aspirates  are unacceptable for Xpert Xpress SARS-CoV-2/FLU/RSV  testing. Fact Sheet for Patients: PinkCheek.be Fact Sheet for Healthcare Providers: GravelBags.it This test is not yet approved or cleared by the [place] FDA and  has been authorized for detection and/or diagnosis of SARS-CoV-2 by  FDA under an Emergency Use Authorization (EUA). This EUA will remain  in effect (meaning this test can be used) for the duration of the  Covid-19 declaration under Section 564(b)(1) of the Act, 21  U.S.C. section 360bbb-3(b)(1), unless the authorization is  terminated or revoked. Performed at [facility], [place] [address], [place], [place] [address]   Blood Culture (routine x 2)     Status: None (Preliminary result)   Collection Time: 04/20/19 11:30 PM   Specimen: BLOOD  Result Value Ref Range Status   Specimen Description BLOOD RIGHT ANTECUBITAL  Final   Special Requests   Final    BOTTLES  DRAWN AEROBIC AND ANAEROBIC Blood Culture adequate volume   Culture   Final    NO GROWTH 2 DAYS Performed at [facility], [place] [address], [place], [place] [address]    Report Status PENDING  Incomplete  Blood Culture (routine x 2)     Status: None (Preliminary result)   Collection Time: 04/21/19  6:10 AM   Specimen: BLOOD LEFT HAND  Result Value Ref Range Status   Specimen Description BLOOD LEFT HAND  Final   Special Requests   Final    BOTTLES DRAWN AEROBIC AND ANAEROBIC Blood Culture adequate volume   Culture   Final    NO GROWTH 2 DAYS Performed at [facility], [place] [address], [place], [place] [address]    Report Status PENDING  Incomplete     Labs: BNP (last 3 results) Recent Labs    04/21/19 0610  BNP 34.7   Basic Metabolic Panel: Recent Labs  Lab 04/20/19 2340 04/21/19 0610  NA 141 140  K 3.9 4.1  CL 98 99  CO2 31 31  GLUCOSE 212* 280*  BUN 5* <5*  CREATININE 0.71 0.80  CALCIUM 8.9 8.6*   Liver  Function Tests: Recent Labs  Lab 04/20/19 2340  AST 24  ALT 41  ALKPHOS 112  BILITOT 1.6*  PROT 6.9  ALBUMIN 3.1*   No results for input(s): LIPASE, AMYLASE in the last 168 hours. No results for input(s): AMMONIA in the last 168 hours. CBC: Recent Labs  Lab 04/20/19 2340 04/21/19 0610 04/22/19 0315 04/23/19 0256 04/24/19 0241  WBC 14.6* 13.4* 12.6* 12.9* 12.0*  NEUTROABS 11.7* 11.4*  --   --   --   HGB 14.4 13.7 13.6 13.2 13.0  HCT 48.4* 46.4* 47.4* 46.3* 45.4  MCV 77.7* 78.2* 80.5 81.5 81.1  PLT 202 183 174 192 194   Cardiac Enzymes: No results for input(s): CKTOTAL, CKMB, CKMBINDEX, TROPONINI in the last 168 hours. BNP: Invalid input(s): POCBNP CBG: Recent Labs  Lab 04/23/19 0724 04/23/19 1150 04/23/19 1624 04/23/19 2203 04/24/19 0747  GLUCAP 156* 146* 99 122* 130*   D-Dimer No results for input(s): DDIMER in the last 72 hours. Hgb A1c No results for input(s): HGBA1C in the last 72 hours. Lipid Profile No results for input(s): CHOL, HDL, LDLCALC, TRIG, CHOLHDL, LDLDIRECT in the last 72 hours. Thyroid function studies No results for input(s): TSH, T4TOTAL, T3FREE, THYROIDAB in the last 72 hours.  Invalid input(s): FREET3 Anemia work up No results for input(s): VITAMINB12, FOLATE, FERRITIN, TIBC, IRON, RETICCTPCT in the last 72 hours. Urinalysis No results found for: COLORURINE, APPEARANCEUR, [place], [place], [place], [place], [place], [place], PROTEINUR, UROBILINOGEN, NITRITE, LEUKOCYTESUR Sepsis Labs Invalid input(s): PROCALCITONIN,  WBC,  LACTICIDVEN Microbiology Recent Results (from the past 240 hour(s))  Novel Coronavirus, NAA (Hosp order, Send-out to Ref Lab; TAT 18-24 hrs     Status: None   Collection Time: 04/20/19  2:41 PM   Specimen: Nasopharyngeal Swab; Respiratory  Result Value Ref Range Status   SARS-CoV-2, NAA NOT DETECTED NOT DETECTED Final    Comment: (NOTE) This nucleic acid amplification test was developed and its performance  characteristics determined by [employer], [employer] and Company. Nucleic acid amplification tests include PCR and TMA. This test has not been FDA cleared or approved. This test has been authorized by FDA under an Emergency Use Authorization (EUA). This test is only authorized for the duration of time the declaration that circumstances exist justifying the authorization of the emergency use of in vitro diagnostic  tests for detection of SARS-CoV-2 virus and/or diagnosis of COVID-19 infection under section 564(b)(1) of the Act, 21 U.S.C. 741OIN-8(M) (1), unless the authorization is terminated or revoked sooner. When diagnostic testing is negative, the possibility of a false negative result should be considered in the context of a patient's recent exposures and the presence of clinical signs and symptoms consistent with COVID-19. An individual without symptoms of COVID- 19 and who is not shedding SARS-CoV-2 vi rus would expect to have a negative (not detected) result in this assay. Performed At: [facility] [address] [place], [place] [id number] Rush Farmer MD JG:[id number]    [place]  Final    Comment: Performed at [facility], [place] [address], [place], [place] [address]  Respiratory Panel by RT PCR (Flu A&B, Covid) - Nasopharyngeal Swab     Status: None   Collection Time: 04/20/19  8:33 PM   Specimen: Nasopharyngeal Swab  Result Value Ref Range Status   SARS Coronavirus 2 by RT PCR NEGATIVE NEGATIVE Final    Comment: (NOTE) SARS-CoV-2 target nucleic acids are NOT DETECTED. The SARS-CoV-2 RNA is generally detectable in upper respiratoy specimens during the acute phase of infection. The lowest concentration of SARS-CoV-2 viral copies this assay can detect is 131 copies/mL. A negative result does not preclude SARS-Cov-2 infection and should not be used as the sole basis for treatment or other patient management decisions. A negative result may occur  with  improper specimen collection/handling, submission of specimen other than nasopharyngeal swab, presence of viral mutation(s) within the areas targeted by this assay, and inadequate number of viral copies (<131 copies/mL). A negative result must be combined with clinical observations, patient history, and epidemiological information. The expected result is Negative. Fact Sheet for Patients:  PinkCheek.be Fact Sheet for Healthcare Providers:  GravelBags.it This test is not yet ap proved or cleared by the [place] FDA and  has been authorized for detection and/or diagnosis of SARS-CoV-2 by FDA under an Emergency Use Authorization (EUA). This EUA will remain  in effect (meaning this test can be used) for the duration of the COVID-19 declaration under Section 564(b)(1) of the Act, 21 U.S.C. section 360bbb-3(b)(1), unless the authorization is terminated or revoked sooner.    Influenza A by PCR NEGATIVE NEGATIVE Final   Influenza B by PCR NEGATIVE NEGATIVE Final    Comment: (NOTE) The Xpert Xpress SARS-CoV-2/FLU/RSV assay is intended as an aid in  the diagnosis of influenza from Nasopharyngeal swab specimens and  should not be used as a sole basis for treatment. Nasal washings and  aspirates are unacceptable for Xpert Xpress SARS-CoV-2/FLU/RSV  testing. Fact Sheet for Patients: PinkCheek.be Fact Sheet for Healthcare Providers: GravelBags.it This test is not yet approved or cleared by the [place] FDA and  has been authorized for detection and/or diagnosis of SARS-CoV-2 by  FDA under an Emergency Use Authorization (EUA). This EUA will remain  in effect (meaning this test can be used) for the duration of the  Covid-19 declaration under Section 564(b)(1) of the Act, 21  U.S.C. section 360bbb-3(b)(1), unless the authorization is  terminated or revoked. Performed  at [facility], [place] [address] [place], [place] [address]   Blood Culture (routine x 2)     Status: None (Preliminary result)   Collection Time: 04/20/19 11:30 PM   Specimen: BLOOD  Result Value Ref Range Status   Specimen Description BLOOD RIGHT ANTECUBITAL  Final   Special Requests   Final  BOTTLES DRAWN AEROBIC AND ANAEROBIC Blood Culture adequate volume   Culture   Final    NO GROWTH 2 DAYS Performed at [facility], [place] [address] [place], [place] [address]    Report Status PENDING  Incomplete  Blood Culture (routine x 2)     Status: None (Preliminary result)   Collection Time: 04/21/19  6:10 AM   Specimen: BLOOD LEFT HAND  Result Value Ref Range Status   Specimen Description BLOOD LEFT HAND  Final   Special Requests   Final    BOTTLES DRAWN AEROBIC AND ANAEROBIC Blood Culture adequate volume   Culture   Final    NO GROWTH 2 DAYS Performed at [facility], [place] [address], [place], [place] [address]    Report Status PENDING  Incomplete     Patient was seen and examined on the day of discharge and was found to be in stable condition. Time coordinating discharge: 25 minutes including assessment and coordination of care, as well as examination of the patient.   SIGNED:  Dessa Phi, DO [facility] 04/24/2019, 10:33 AM

## 2019-04-24 NOTE — TOC Transition Note (Signed)
Transition of Care [facility]) - CM/SW Discharge Note   Patient Details  Name: Regina Wood MRN: [id number] Date of Birth: January 30, 1981  [facility] CM/SW Contact:  Carles Collet, RN Phone Number: 04/24/2019, 10:07 AM   Clinical Narrative:    Referral made to [facility] for home oxygen. Spoke w sister at bedside over the phone and made aware that tank will be delivered to room (est. 2 hours) prior to DC and also later today at home.  No other CM needs identified at this time.    Final next level of care: Home/Self Care Barriers to Discharge: No Barriers Identified   Patient Goals and CMS Choice        Discharge Placement                       Discharge Plan and Services                DME Arranged: Oxygen DME Agency: [place] Date DME Agency Contacted: 04/24/19 Time DME Agency Contacted: [phone] Representative spoke with at DME Agency: [place] ([place]) Interventions     Readmission Risk Interventions No flowsheet data found.

## 2019-04-26 LAB — CULTURE, BLOOD (ROUTINE X 2)
Culture: NO GROWTH
Culture: NO GROWTH
Special Requests: ADEQUATE
Special Requests: ADEQUATE

## 2019-07-07 DIAGNOSIS — F79 Unspecified intellectual disabilities: Secondary | ICD-10-CM | POA: Insufficient documentation

## 2019-11-06 ENCOUNTER — Ambulatory Visit (HOSPITAL_COMMUNITY)
Admission: EM | Admit: 2019-11-06 | Discharge: 2019-11-06 | Disposition: A | Discharge status: Home / Self Care | Payer: Medicare Other | Attending: Family Medicine | Admitting: Family Medicine

## 2019-11-06 ENCOUNTER — Ambulatory Visit (INDEPENDENT_AMBULATORY_CARE_PROVIDER_SITE_OTHER): Payer: Medicare Other

## 2019-11-06 ENCOUNTER — Other Ambulatory Visit: Payer: Self-pay

## 2019-11-06 ENCOUNTER — Encounter (HOSPITAL_COMMUNITY): Payer: Self-pay | Admitting: *Deleted

## 2019-11-06 DIAGNOSIS — Z20822 Contact with and (suspected) exposure to covid-19: Secondary | ICD-10-CM | POA: Diagnosis not present

## 2019-11-06 DIAGNOSIS — R0989 Other specified symptoms and signs involving the circulatory and respiratory systems: Secondary | ICD-10-CM | POA: Diagnosis not present

## 2019-11-06 DIAGNOSIS — J189 Pneumonia, unspecified organism: Secondary | ICD-10-CM | POA: Insufficient documentation

## 2019-11-06 DIAGNOSIS — R0602 Shortness of breath: Secondary | ICD-10-CM

## 2019-11-06 HISTORY — DX: Pneumonia, unspecified organism: J18.9

## 2019-11-06 MED ORDER — LEVOFLOXACIN 750 MG PO TABS: 750.0000 mg | ORAL_TABLET | Freq: Every day | ORAL | 0 refills | Status: DC

## 2019-11-06 NOTE — ED Triage Notes (Addendum)
Pt on Ox 3L via N/C.  Per family member, pt has been oxygen dependent over past yr since having pneumonia; states normally runs in "80s-90s WITH oxygen in place".  C/O increased SOB over past couple days.  Pt alert.

## 2019-11-06 NOTE — ED Triage Notes (Addendum)
Family member states pt normally using approx 1 L O2, but has needed 3L over past couple days.  C/O new cough x 2 days.

## 2019-11-07 LAB — NOVEL CORONAVIRUS, NAA (HOSP ORDER, SEND-OUT TO REF LAB; TAT 18-24 HRS): SARS-CoV-2, NAA: NOT DETECTED

## 2019-11-08 NOTE — ED Provider Notes (Signed)
[facility]   [id number] 11/06/19 Arrival Time: 1525  ASSESSMENT & PLAN:  1. Community acquired pneumonia of right middle lobe of lung     I have personally viewed the imaging studies ordered this visit. Question R sided PNA.  Begin: Meds ordered this encounter  Medications  . levofloxacin (LEVAQUIN) 750 MG tablet    Sig: Take 1 tablet (750 mg total) by mouth daily.    Dispense:  7 tablet    Refill:  0     Follow-up Information    [facility] EMERGENCY DEPARTMENT.   Specialty: Emergency Medicine Why: If symptoms worsen in any way. Contact information: [address] [id number] mc [place] [address] [phone]              Reviewed expectations re: course of current medical issues. Questions answered. Outlined signs and symptoms indicating need for more acute intervention. Patient verbalized understanding. After Visit Summary given.   SUBJECTIVE: History from: caregiver.  Regina Wood is a 39 y.o. female who presents with increased O2 requirement; past few days; coughing; feels SOB at times. Tolerating normal PO intake without n/v. No fever reported.   Social History   Tobacco Use  Smoking Status Never Smoker  Smokeless Tobacco Never Used     OBJECTIVE:  Vitals:   11/06/19 1531 11/06/19 1548  BP:  137/71  Pulse: (!) 122 (!) 122  Resp:  (!) 26  Temp:  98.8 F (37.1 C)  TempSrc:  Oral  SpO2: 93% 98%     General appearance: alert; NAD HEENT: nasal congestion; clear runny nose Neck: supple without LAD CV: RRR Lungs: unlabored respirations, symmetrical air entry without wheezing; cough: moderate Abd: soft Ext: no LE edema Skin: warm and dry Psychological: alert and cooperative; normal mood and affect  Imaging: DG Chest 2 View  Result Date: 11/06/2019 CLINICAL DATA:  Shortness of breath.  Oxygen desaturation. EXAM: CHEST - 2 VIEW COMPARISON:  Single-view of the chest 04/20/2019. PA and  lateral chest 12/29/2008. CT chest 04/21/2019. FINDINGS: Although the exam is somewhat limited by the patient's body habitus, there appears to be right middle lobe airspace disease. Heart size is normal. No pneumothorax or pleural effusion. No acute or focal bony abnormality. IMPRESSION: Limited examination due to habitus demonstrates findings worrisome for right middle lobe pneumonia. Electronically Signed   By: Drusilla Kanner M.D.   On: 11/06/2019 16:43    No Known Allergies  Past Medical History:  Diagnosis Date  . Diabetes mellitus without complication (HCC)   . Obesity   . Pneumonia    Family History  Problem Relation Age of Onset  . Healthy Mother   . Hypertension Father   . Diabetes Father    Social History   Socioeconomic History  . Marital status: Single    Spouse name: Not on file  . Number of children: Not on file  . Years of education: Not on file  . Highest education level: Not on file  Occupational History  . Not on file  Tobacco Use  . Smoking status: Never Smoker  . Smokeless tobacco: Never Used  Vaping Use  . Vaping Use: Never used  Substance and Sexual Activity  . Alcohol use: Never  . Drug use: Never  . Sexual activity: Not on file  Other Topics Concern  . Not on file  Social History Narrative  . Not on file   Social Determinants of Health   Financial Resource Strain:   . Difficulty  of Paying Living Expenses:   Food Insecurity:   . Worried About Programme researcher, broadcasting/film/video in the Last Year:   . Barista in the Last Year:   Transportation Needs:   . Freight forwarder (Medical):   Marland Kitchen Lack of Transportation (Non-Medical):   Physical Activity:   . Days of Exercise per Week:   . Minutes of Exercise per Session:   Stress:   . Feeling of Stress :   Social Connections:   . Frequency of Communication with Friends and Family:   . Frequency of Social Gatherings with Friends and Family:   . Attends Religious Services:   . Active Member of Clubs  or Organizations:   . Attends Banker Meetings:   Marland Kitchen Marital Status:   Intimate Partner Violence:   . Fear of Current or Ex-Partner:   . Emotionally Abused:   Marland Kitchen Physically Abused:   . Sexually Abused:            Mardella Layman, MD 11/08/19 [phone]

## 2019-11-16 ENCOUNTER — Telehealth: Payer: Self-pay | Admitting: Emergency Medicine

## 2019-11-16 NOTE — Telephone Encounter (Signed)
Attempted to call pt's mother Regina Wood but unable to reach and unable to leave a VM due to mailbox not set up yet. Will try to call back later.

## 2019-11-17 NOTE — Telephone Encounter (Signed)
ATC pt's mother, Malachi Bonds. There was no answer and I was unable to leave a message due to her VM not being set up.

## 2019-11-18 NOTE — Telephone Encounter (Signed)
ATC Regina Wood, still no answer and no voicemail x 3 so will close per protocol

## 2019-12-31 ENCOUNTER — Encounter: Payer: Self-pay | Admitting: Emergency Medicine

## 2019-12-31 ENCOUNTER — Ambulatory Visit (INDEPENDENT_AMBULATORY_CARE_PROVIDER_SITE_OTHER): Payer: Medicare Other | Admitting: Emergency Medicine

## 2019-12-31 ENCOUNTER — Other Ambulatory Visit: Payer: Self-pay

## 2019-12-31 VITALS — BP 114/74 | HR 103 | Temp 97.9°F | Ht 59.0 in | Wt 270.4 lb

## 2019-12-31 DIAGNOSIS — R9389 Abnormal findings on diagnostic imaging of other specified body structures: Secondary | ICD-10-CM

## 2019-12-31 DIAGNOSIS — R918 Other nonspecific abnormal finding of lung field: Secondary | ICD-10-CM

## 2019-12-31 NOTE — Assessment & Plan Note (Signed)
Abnormal CT chest with bilateral groundglass infiltrates, etiology unclear.  Question whether she may be at risk for dysphagia, aspiration.  She does not believe that she is having any symptoms and mom has not seen any.  Consider also hypertensive urgency/crisis with flash pulmonary edema.  Could also consider air trapping.  Will be difficult for her to perform PFT at this time as I believe she would have difficulty following the instructions.  I think the most report approach for now at least is to repeat her CT chest and see if the infiltrates have resolved.  If they persistently may have to talk about other work-up to rule out inflammatory process, opportunistic infection, etc.

## 2019-12-31 NOTE — Progress Notes (Signed)
Subjective:    Patient ID: Regina Wood, female    DOB: 1980/08/20, 39 y.o.   MRN: [id number]  HPI 39 year old woman, never smoker, with history of developmental delay, obesity, diabetes mellitus, hypertension.  She has been admitted in December for community-acquired pneumonia with imaging scattered groundglass as below. She was discharged on home oxygen.  Subsequently was back in the ED in July with symptoms, found to have a possible right middle lobe infiltrate on chest x-ray (difficult exam due to body habitus). She was experiencing SOB, fever, HA. ? Cough, no mucous. Not on any BD therapy. Her activity is limited by dyspnea. Sometimes may hear wheeze w exertion.   CT Chest 04/20/20 reviewed by me shows bilateral scattered groundglass infiltrates without effusions or consolidation.   Review of Systems As per HPI  Past Medical History:  Diagnosis Date  . Diabetes mellitus without complication ([place])   . Obesity   . Pneumonia      Family History  Problem Relation Age of Onset  . Healthy Mother   . Hypertension Father   . Diabetes Father      Social History   Socioeconomic History  . Marital status: Single    Spouse name: Not on file  . Number of children: Not on file  . Years of education: Not on file  . Highest education level: Not on file  Occupational History  . Not on file  Tobacco Use  . Smoking status: Never Smoker  . Smokeless tobacco: Never Used  Vaping Use  . Vaping Use: Never used  Substance and Sexual Activity  . Alcohol use: Never  . Drug use: Never  . Sexual activity: Not on file  Other Topics Concern  . Not on file  Social History Narrative  . Not on file   Social Determinants of Health   Financial Resource Strain:   . Difficulty of Paying Living Expenses: Not on file  Food Insecurity:   . Worried About Charity fundraiser in the Last Year: Not on file  . Ran Out of Food in the Last Year: Not on file  Transportation Needs:   . Lack of  Transportation (Medical): Not on file  . Lack of Transportation (Non-Medical): Not on file  Physical Activity:   . Days of Exercise per Week: Not on file  . Minutes of Exercise per Session: Not on file  Stress:   . Feeling of Stress : Not on file  Social Connections:   . Frequency of Communication with Friends and Family: Not on file  . Frequency of Social Gatherings with Friends and Family: Not on file  . Attends Religious Services: Not on file  . Active Member of Clubs or Organizations: Not on file  . Attends Archivist Meetings: Not on file  . Marital Status: Not on file  Intimate Partner Violence:   . Fear of Current or Ex-Partner: Not on file  . Emotionally Abused: Not on file  . Physically Abused: Not on file  . Sexually Abused: Not on file     No Known Allergies   Outpatient Medications Prior to Visit  Medication Sig Dispense Refill  . ARIPiprazole (ABILIFY) 10 MG tablet Take 10 mg by mouth daily.    . benztropine (COGENTIN) 1 MG tablet Take 1 mg by mouth daily.    . blood glucose meter kit and supplies Dispense based on patient and insurance preference. Use up to four times daily as directed. (FOR ICD-10 E10.9, E11.9). 1  each 0  . naproxen sodium (ANAPROX) 550 MG tablet Take 550 mg by mouth as needed.    Marland Kitchen levofloxacin (LEVAQUIN) 750 MG tablet Take 1 tablet (750 mg total) by mouth daily. 7 tablet 0  . metFORMIN (GLUCOPHAGE) 500 MG tablet Take 1 tablet (500 mg total) by mouth 2 (two) times daily with a meal. 60 tablet 2   No facility-administered medications prior to visit.  \       Objective:   Physical Exam Vitals:   12/31/19 1150  BP: 114/74  Pulse: (!) 103  Temp: 97.9 F (36.6 C)  TempSrc: Temporal  SpO2: 98%  Weight: 270 lb 6.4 oz (122.7 kg)  Height: $Remove'4\' 11"'ZjvKliz$  (1.499 m)   Gen: Pleasant, obese woman, in no distress  ENT: No lesions,  mouth clear,  oropharynx clear, no postnasal drip  Neck: No JVD, no stridor  Lungs: No use of accessory  muscles, very decreased at both bases, distant, no crackles or wheezing on normal respiration  Cardiovascular: RRR, heart sounds normal, no murmur or gallops, no peripheral edema  Musculoskeletal: No deformities, no cyanosis or clubbing  Neuro: alert, awake, developmental delay and depends on her mom to answer most questions.   Skin: Warm, no lesions or rash     Assessment & Plan:  Abnormal CT of the chest Abnormal CT chest with bilateral groundglass infiltrates, etiology unclear.  Question whether she may be at risk for dysphagia, aspiration.  She does not believe that she is having any symptoms and mom has not seen any.  Consider also hypertensive urgency/crisis with flash pulmonary edema.  Could also consider air trapping.  Will be difficult for her to perform PFT at this time as I believe she would have difficulty following the instructions.  I think the most report approach for now at least is to repeat her CT chest and see if the infiltrates have resolved.  If they persistently may have to talk about other work-up to rule out inflammatory process, opportunistic infection, etc.  Baltazar Apo, MD, PhD 12/31/2019, 5:28 PM [facility] [phone] or if no answer [phone]

## 2019-12-31 NOTE — Patient Instructions (Signed)
We will repeat your CT scan of the chest without contrast to compare with your prior film from December 2020. Follow-up with Dr. Delton Coombes after the CT scan so that we can review together.

## 2020-01-11 ENCOUNTER — Ambulatory Visit (HOSPITAL_COMMUNITY): Payer: Medicare Other

## 2020-01-12 ENCOUNTER — Ambulatory Visit (HOSPITAL_COMMUNITY): Payer: Medicare Other

## 2020-01-20 ENCOUNTER — Ambulatory Visit (HOSPITAL_COMMUNITY)
Admission: RE | Admit: 2020-01-20 | Discharge: 2020-01-20 | Disposition: A | Discharge status: Home / Self Care | Payer: Medicare Other | Source: Ambulatory Visit | Attending: Emergency Medicine | Admitting: Emergency Medicine

## 2020-01-20 ENCOUNTER — Other Ambulatory Visit: Payer: Self-pay

## 2020-01-20 DIAGNOSIS — R918 Other nonspecific abnormal finding of lung field: Secondary | ICD-10-CM | POA: Diagnosis not present

## 2020-05-12 ENCOUNTER — Ambulatory Visit (INDEPENDENT_AMBULATORY_CARE_PROVIDER_SITE_OTHER): Payer: Medicare Other | Admitting: Emergency Medicine

## 2020-05-12 ENCOUNTER — Other Ambulatory Visit: Payer: Self-pay

## 2020-05-12 DIAGNOSIS — R9389 Abnormal findings on diagnostic imaging of other specified body structures: Secondary | ICD-10-CM

## 2020-05-12 NOTE — Progress Notes (Signed)
   Subjective:    Patient ID: Regina Wood, female    DOB: 12/18/1980, 40 y.o.   MRN: [id number]  HPI 40 year old woman, never smoker, with history of developmental delay, obesity, diabetes mellitus, hypertension.  She has been admitted in December for community-acquired pneumonia with imaging scattered groundglass as below. She was discharged on home oxygen.  Subsequently was back in the ED in July with symptoms, found to have a possible right middle lobe infiltrate on chest x-ray (difficult exam due to body habitus). She was experiencing SOB, fever, HA. ? Cough, no mucous. Not on any BD therapy. Her activity is limited by dyspnea. Sometimes may hear wheeze w exertion.   CT Chest 04/20/20 reviewed by me shows bilateral scattered groundglass infiltrates without effusions or consolidation.   Discussion with patient's mother 05/12/20:  Regina Wood was unable to attend today but her mother is here.  She is 53, has a history of developmental delay, obesity, diabetes, hypertension.  She was seen for scattered groundglass infiltrates and consolidation on CT chest from a year ago.  We decided to repeat the CT chest and this was done 01/21/2020.  I reviewed.  The areas of consolidation and pneumonia have improved.  She does still have some mild scattered groundglass, question air-trapping.  Not clear to me that she could tolerate or perform pulmonary function testing due to her developmental delay.  Her CT chest has some mild GG vs air trapping that dates back to Ct from 2013, reviewed today. She is asymptomatic per her mother. I believe we can hold off on any fyurther imaging or PFT unless she develops sx. At That point she would need a repeat CT, PFT, exam, etc.    Levy Pupa, MD, PhD 05/12/2020, 11:58 AM [facility] [phone] or if no answer [phone]

## 2021-11-26 ENCOUNTER — Encounter: Payer: Self-pay | Admitting: Neurology

## 2021-11-26 ENCOUNTER — Ambulatory Visit (INDEPENDENT_AMBULATORY_CARE_PROVIDER_SITE_OTHER): Payer: Medicare Other | Admitting: Neurology

## 2021-11-26 VITALS — BP 134/77 | HR 107 | Ht 59.0 in | Wt 264.0 lb

## 2021-11-26 DIAGNOSIS — G629 Polyneuropathy, unspecified: Secondary | ICD-10-CM

## 2021-11-26 DIAGNOSIS — G5603 Carpal tunnel syndrome, bilateral upper limbs: Secondary | ICD-10-CM

## 2021-11-26 MED ORDER — PREGABALIN 200 MG PO CAPS: 200.0000 mg | ORAL_CAPSULE | Freq: Two times a day (BID) | ORAL | 3 refills | Status: DC

## 2021-11-26 NOTE — Progress Notes (Unsigned)
[facility]    Provider:  Dr Jaynee Wood Requesting Provider: Michael Boston, MD Primary Care Provider:  Michael Boston, MD  CC:  Hand and foot paresthesias  HPI:  Regina Wood is a 41 y.o. female here as requested by Regina Boston, MD for tingling in hands, feet and legs. Reviewed Regina Wood notes: failed gabapentin and lyrica, has developmental delay, diabetes, diabetic nephropathy. Had emg/ncs?  I reviewed Regina Wood notes, complaining of foot leg and hands hurting and wondering if she should be seen, has not gotten any better with medication changes, she is crying a lot, Also reviewed orthopedics notes Regina Wood March 2023 who presented with her mother, diagnosed with intellectual disability, complaints of bilateral hand pain and numbness and tingling, right worse than left, at that time approximately ongoing 2 months, no injuries, she was diagnosed by Regina Wood with bilateral carpal tunnel syndrome and they chose to try splints from the options they discussed.  Regina Wood states that a CTS test was negative however I do not see that information or any EMG nerve conduction study.  The patient is intellectually disabled and unable to provide lots of details, she complains of low back pain, elbow and knee pain, also has hypertension and has a psychiatrist, she is morbidly obese.  Patient states she has pain in the heel and whole foot like they tingle and are asleep, numbness. Mostly at night in the feet. She can go to sleep but they hurt in the morning before getting out of bed, she complains during the day as well, a few months. She was told to supplement b12 so likely B12 deficiency and diabetes. I don't have a b12 value but they will see Regina Wood this week and can also ask about thyroid.   Hands: in the thumb and 1st 3 fingers . Saw Regina Wood, diagnosed with CTS, and the splints didn't fit.  First 3 fingers. We discussed Regina Wood July evaluation and that she was diagnosed with  CTS. We reviewed CTS. Mother had CTS and release. She has not been wearing the wrist splints at night. Discussed emg/ncs. Also offered OT but mother would like to make sure it is CTS will send back to Regina Wood for emg/ncs which he recommended anyway.  Reviewed notes, labs and imaging from outside physicians, which showed:  Labs collected include B12, BMP with elevated glucose otherwise normal with BUN 70 creatinine 0.7, A1c 6.9, I do not see B12 value in the notes.  Review of Systems: Patient complains of symptoms per HPI as well as the following symptoms pain. Pertinent negatives and positives per HPI. All others negative.   Social History   Socioeconomic History   Marital status: Single    Spouse name: Not on file   Number of children: Not on file   Years of education: Not on file   Highest education level: Not on file  Occupational History   Not on file  Tobacco Use   Smoking status: Never   Smokeless tobacco: Never  Vaping Use   Vaping Use: Never used  Substance and Sexual Activity   Alcohol use: Yes    Comment: beer occ   Drug use: Never   Sexual activity: Not on file  Other Topics Concern   Not on file  Social History Narrative   Not on file   Social Determinants of Health   Financial Resource Strain: Not on file  Food Insecurity: Not on file  Transportation Needs: Not  on file  Physical Activity: Not on file  Stress: Not on file  Social Connections: Not on file  Intimate Partner Violence: Not on file    Family History  Problem Relation Age of Onset   Healthy Mother    Migraines Mother    Hypertension Father    Diabetes Father    Neuropathy Neg Hx     Past Medical History:  Diagnosis Date   Diabetes mellitus without complication ([place])    Obesity    Pneumonia     Patient Active Problem List   Diagnosis Date Noted   Abnormal CT of the chest 12/31/2019   Acute respiratory failure with hypoxia ([place]) 04/21/2019   Pneumonia 04/21/2019   Hyperglycemia  04/21/2019   Hypertensive urgency 04/21/2019   Developmental delay 04/21/2019    History reviewed. No pertinent surgical history.  Current Outpatient Medications  Medication Sig Dispense Refill   benztropine (COGENTIN) 1 MG tablet Take 1 mg by mouth daily.     blood glucose meter kit and supplies Dispense based on patient and insurance preference. Use up to four times daily as directed. (FOR ICD-10 E10.9, E11.9). 1 each 0   Cyanocobalamin (VITAMIN B 12 PO) Take by mouth daily.     losartan (COZAAR) 100 MG tablet Take 100 mg by mouth daily.     pregabalin (LYRICA) 200 MG capsule Take 1 capsule (200 mg total) by mouth 2 (two) times daily. 60 capsule 3   Probiotic Product ([place]) Take by mouth daily at 2 PM.     metFORMIN (GLUCOPHAGE) 500 MG tablet Take 1 tablet (500 mg total) by mouth 2 (two) times daily with a meal. 60 tablet 2   No current facility-administered medications for this visit.    Allergies as of 11/26/2021   (No Known Allergies)    Vitals: BP 134/77   Pulse (!) 107   Ht $R'4\' 11"'YB$  (1.499 m)   Wt 264 lb (119.7 kg)   BMI 53.32 kg/m  Last Weight:  Wt Readings from Last 1 Encounters:  11/26/21 264 lb (119.7 kg)   Last Height:   Ht Readings from Last 1 Encounters:  11/26/21 $RemoveB'4\' 11"'xCVYrEzE$  (1.499 m)     Physical exam: Exam: Gen: NAD, conversant, well nourised, obese, well groomed                     CV: RRR, no MRG. No Carotid Bruits. No peripheral edema, warm, nontender Eyes: Conjunctivae clear without exudates or hemorrhage  Neuro: Detailed Neurologic Exam  Speech:    Speech is normal; fluent and spontaneous with normal comprehension.  Cognition:    The patient is oriented to person    recent and remote memory impaired;     language fluent;     Impaired  attention, concentration, fund of knowledge Cranial Nerves:    The pupils are equal, round, and reactive to light.  Attempted, could not visualize fundi.  Visual fields are full to threat.  Extraocular movements are intact. Trigeminal sensation is intact and the muscles of mastication are normal. The face is symmetric. The palate elevates in the midline. Hearing intact. Voice is normal. Shoulder shrug is normal. The tongue has normal motion without fasciculations.   Coordination:    No dysmetria noted  Gait:    Can walk independently no ataxia noted  Motor Observation:    No asymmetry, no atrophy, and no involuntary movements noted. Tone:    Normal muscle tone.    Posture:  Posture is normal. normal erect    Strength:    Strength is equal and symmetric in the upper and lower limbs without focal deficits     Sensation: intact to LT     Reflex Exam:  DTR's:    Deep tendon reflexes in the upper and lower extremities are symmetrical bilaterally.   Toes:    The toes are equivocal bilaterally.   Clonus:    Clonus is absent.    Assessment/Plan:  CTS in the hands. Likely diabetic neuropathy small fiber in the feet.   Hands:  +Mcphalen's and +Tinels at the wrists. Has Seen Ortho dxed with CTS: Per Regina Wood copied from his notes "Plan: I discussed with Regina Wood and her mother the nature of carpal tunnel syndrome. We discussed treatment options including acceptance, day and/or nighttime splinting, injection of the carpal tunnel, and carpal tunnel release. We discussed potential further evaluation with nerve conduction studies. At this time she and her mother would like to try wrist splints and see if this provides some relief. I think this is reasonable. She was provided splints for use as needed. I will see her back on a as needed basis. If they note any worsening of symptoms or failure to improve I will be happy to see her back. They agree with the plan of care."   - My recommendation for the hands would be to return to Regina Wood and get EMG/NCS and pursue treatment. I will send referral back to his office for emg/ncs and further treatment.  Feet: Patient states she  has pain in the heel and whole foot like they tingle and are asleep, numbness. Mostly at night in the feet. She can go to sleep but they hurt in the morning before getting out of bed and all day. She was told to supplement b12 so maybe B12 deficiency?(I can't find the B12 value in the consult notes). and diabetes. I don't have a b12 value but they will see Regina Wood this week and can also ask about thyroid as well and make sure that has been completed at some point. I will test for a few more causes of neuropathy (by exam appears to be small-fiber). Increased Lyrica. Regina Wood can further increase Lyrica as needed.  -May consider daily alpha lipoic acid which is an antioxidant that may reduce free radical oxidative stress associated with diabetic polyneuropathy, existing evidence suggests that alpha lipoic acid significantly reduces stabbing, lancinating and burning pain and diabetic neuropathy with its onset of action as early as 1-2 weeks. 400-$RemoveBefore'600mg'yteLeAYJzJRAU$ /day  - topical lidocaine or capsaicin on the feet  Orders Placed This Encounter  Procedures   Angiotensin converting enzyme   Sjogren's syndrome antibods(ssa + ssb)   B12 and Folate Panel   Rheumatoid factor   Heavy metals, blood   Vitamin B6   Multiple Myeloma Panel (SPEP&IFE w/QIG)   Methylmalonic acid, serum   ANA   NCV with EMG(electromyography)   Meds ordered this encounter  Medications   pregabalin (LYRICA) 200 MG capsule    Sig: Take 1 capsule (200 mg total) by mouth 2 (two) times daily.    Dispense:  60 capsule    Refill:  3    Cancel any prior lyrica prescriptions    Cc: Regina Boston, MD,  Regina Lefevre Jesse Sans, MD  Sarina Ill, MD  [facility] [address] [place], [place] [address]  Phone [phone] Fax [phone]  I spent over 60 minutes of face-to-face and  non-face-to-face time with patient on the  1. Polyneuropathy   2. Bilateral carpal tunnel syndrome    diagnosis.  This included  previsit chart review, lab review, study review, order entry, electronic health record documentation, patient education on the different diagnostic and therapeutic options, counseling and coordination of care, risks and benefits of management, compliance, or risk factor reduction

## 2021-11-26 NOTE — Patient Instructions (Addendum)
Hands: Per Dr. Fredna Dow copied from notes "Plan: I discussed with Regina Wood and her mother the nature of carpal tunnel syndrome. We discussed treatment options including acceptance, day and/or nighttime splinting, injection of the carpal tunnel, and carpal tunnel release. We discussed potential further evaluation with nerve conduction studies. At this time she and her mother would like to try wrist splints and see if this provides some relief. I think this is reasonable. She was provided splints for use as needed. I will see her back on a as needed basis. If they note any worsening of symptoms or failure to improve I will be happy to see her back. They agree with the plan of care."   - My recommendation for the hands would be to return to Dr. Fredna Dow and get EMG/NCS and pursue treatment. I will send referral back to his office for emg/ncs and further treatment.  Feet: Patient states she has pain in the heel and whole foot like they tingle and are asleep, numbness. Mostly at night in the feet. She can go to sleep but they hurt in the morning before getting out of bed and all day. She was told to supplement b12 so maybe B12 deficiency?(I can't find the B12 value in the consult notes). and diabetes. I don't have a b12 value but they will see Dr. Reymundo Poll this week and can also ask about thyroid as well and make sure that has been completed at some point. I will test for a few more causes of neuropathy (by exam appears to be small-fiber). Increased Lyrica. Dr. Reymundo Poll can further increase Lyrica as needed.  -May consider daily alpha lipoic acid which is an antioxidant that may reduce free radical oxidative stress associated with diabetic polyneuropathy, existing evidence suggests that alpha lipoic acid significantly reduces stabbing, lancinating and burning pain and diabetic neuropathy with its onset of action as early as 1-2 weeks. 400-600mg /day  - topical lidocaine or capsaicin on the feet    Peripheral  Neuropathy Peripheral neuropathy is a type of nerve damage. It affects nerves that carry signals between the spinal cord and the arms, legs, and the rest of the body (peripheral nerves). It does not affect nerves in the spinal cord or brain. In peripheral neuropathy, one nerve or a group of nerves may be damaged. Peripheral neuropathy is a broad category that includes many specific nerve disorders, like diabetic neuropathy, hereditary neuropathy, and carpal tunnel syndrome. What are the causes? This condition may be caused by: Certain diseases, such as: Diabetes. This is the most common cause of peripheral neuropathy. Autoimmune diseases, such as rheumatoid arthritis and systemic lupus erythematosus. Nerve diseases that are passed from parent to child (inherited). Kidney disease. Thyroid disease. Other causes may include: Nerve injury. Pressure or stress on a nerve that lasts a long time. Lack (deficiency) of B vitamins. This can result from alcoholism, poor diet, or a restricted diet. Infections. Some medicines, such as cancer medicines (chemotherapy). Poisonous (toxic) substances, such as lead and mercury. Too little blood flowing to the legs. In some cases, the cause of this condition is not known. What are the signs or symptoms? Symptoms of this condition depend on which of your nerves is damaged. Symptoms in the legs, hands, and arms can include: Loss of feeling (numbness) in the feet, hands, or both. Tingling in the feet, hands, or both. Burning pain. Very sensitive skin. Weakness. Not being able to move a part of the body (paralysis). Clumsiness or poor coordination. Muscle twitching. Loss  of balance. Symptoms in other parts of the body can include: Not being able to control your bladder. Feeling dizzy. Sexual problems. How is this diagnosed? Diagnosing and finding the cause of peripheral neuropathy can be difficult. Your health care provider will take your medical history  and do a physical exam. A neurological exam will also be done. This involves checking things that are affected by your brain, spinal cord, and nerves (nervous system). For example, your health care provider will check your reflexes, how you move, and what you can feel. You may have other tests, such as: Blood tests. Electromyogram (EMG) and nerve conduction tests. These tests check nerve function and how well the nerves are controlling the muscles. Imaging tests, such as a CT scan or MRI, to rule out other causes of your symptoms. Removing a small piece of nerve to be examined in a lab (nerve biopsy). Removing and examining a small amount of the fluid that surrounds the brain and spinal cord (lumbar puncture). How is this treated? Treatment for this condition may involve: Treating the underlying cause of the neuropathy, such as diabetes, kidney disease, or vitamin deficiencies. Stopping medicines that can cause neuropathy, such as chemotherapy. Medicine to help relieve pain. Medicines may include: Prescription or over-the-counter pain medicine. Anti-seizure medicine. Antidepressants. Pain-relieving patches that are applied to painful areas of skin. Surgery to relieve pressure on a nerve or to destroy a nerve that is causing pain. Physical therapy to help improve movement and balance. Devices to help you move around (assistive devices). Follow these instructions at home: Medicines Take over-the-counter and prescription medicines only as told by your health care provider. Do not take any other medicines without first asking your health care provider. Ask your health care provider if the medicine prescribed to you requires you to avoid driving or using machinery. Lifestyle  Do not use any products that contain nicotine or tobacco. These products include cigarettes, chewing tobacco, and vaping devices, such as e-cigarettes. Smoking keeps blood from reaching damaged nerves. If you need help  quitting, ask your health care provider. Avoid or limit alcohol. Too much alcohol can cause a vitamin B deficiency, and vitamin B is needed for healthy nerves. Eat a healthy diet. This includes: Eating foods that are high in fiber, such as beans, whole grains, and fresh fruits and vegetables. Limiting foods that are high in fat and processed sugars, such as fried or sweet foods. General instructions  If you have diabetes, work closely with your health care provider to keep your blood sugar under control. If you have numbness in your feet: Check every day for signs of injury or infection. Watch for redness, warmth, and swelling. Wear padded socks and comfortable shoes. These help protect your feet. Develop a good support system. Living with peripheral neuropathy can be stressful. Consider talking with a mental health specialist or joining a support group. Use assistive devices and attend physical therapy as told by your health care provider. This may include using a walker or a cane. Keep all follow-up visits. This is important. Where to find more information [employer] of Neurological Disorders: [employer] Contact a health care provider if: You have new signs or symptoms of peripheral neuropathy. You are struggling emotionally from dealing with peripheral neuropathy. Your pain is not well controlled. Get help right away if: You have an injury or infection that is not healing normally. You develop new weakness in an arm or leg. You have fallen or do so frequently. Summary Peripheral neuropathy  is when the nerves in the arms or legs are damaged, resulting in numbness, weakness, or pain. There are many causes of peripheral neuropathy, including diabetes, pinched nerves, vitamin deficiencies, autoimmune disease, and hereditary conditions. Diagnosing and finding the cause of peripheral neuropathy can be difficult. Your health care provider will take your medical history, do a  physical exam, and do tests, including blood tests and nerve function tests. Treatment involves treating the underlying cause of the neuropathy and taking medicines to help control pain. Physical therapy and assistive devices may also help. This information is not intended to replace advice given to you by your health care provider. Make sure you discuss any questions you have with your health care provider. Document Revised: 12/26/2020 Document Reviewed: 12/26/2020 Elsevier Patient Education  [place].  Pregabalin Capsules What is this medication? PREGABALIN (pre GAB a lin) treats nerve pain. It may also be used to prevent and control seizures in people with epilepsy. It works by calming overactive nerves in your body. This medicine may be used for other purposes; ask your health care provider or pharmacist if you have questions. COMMON BRAND NAME(S): Lyrica What should I tell my care team before I take this medication? They need to know if you have any of these conditions: Drug abuse or addiction Heart failure Kidney disease Lung disease Suicidal thoughts, plans or attempt An unusual or allergic reaction to pregabalin, other medications, foods, dyes, or preservatives Pregnant or trying to get pregnant Breast-feeding How should I use this medication? Take this medication by mouth with water. Take it as directed on the prescription label at the same time every day. You can take it with or without food. If it upsets your stomach, take it with food. Keep taking it unless your care team tells you to stop. A special MedGuide will be given to you by the pharmacist with each prescription and refill. Be sure to read this information carefully each time. Talk to your care team about the use of this medication in children. While it may be prescribed for children as young as 1 month for selected conditions, precautions do apply. Overdosage: If you think you have taken too much of this  medicine contact a poison control center or emergency room at once. NOTE: This medicine is only for you. Do not share this medicine with others. What if I miss a dose? If you miss a dose, take it as soon as you can. If it is almost time for your next dose, take only that dose. Do not take double or extra doses. What may interact with this medication? Alcohol Antihistamines for allergy, cough, and cold Certain medications for anxiety or sleep Certain medications for blood pressure, heart disease Certain medications for depression like amitriptyline, fluoxetine, sertraline Certain medications for diabetes, like pioglitazone, rosiglitazone Certain medications for seizures like phenobarbital, primidone General anesthetics like halothane, isoflurane, methoxyflurane, propofol Medications that relax muscles for surgery Narcotic medications for pain Phenothiazines like chlorpromazine, mesoridazine, prochlorperazine, thioridazine This list may not describe all possible interactions. Give your health care provider a list of all the medicines, herbs, non-prescription drugs, or dietary supplements you use. Also tell them if you smoke, drink alcohol, or use illegal drugs. Some items may interact with your medicine. What should I watch for while using this medication? Visit your care team for regular checks on your progress. Tell your care team if your symptoms do not start to get better or if they get worse. Do not suddenly stop taking  this medication. You may develop a severe reaction. Your care team will tell you how much medication to take. If your care team wants you to stop the medication, the dose may be slowly lowered over time to avoid any side effects. You may get drowsy or dizzy. Do not drive, use machinery, or do anything that needs mental alertness until you know how this medication affects you. Do not stand up or sit up quickly, especially if you are an older patient. This reduces the risk of  dizzy or fainting spells. Alcohol may interfere with the effect of this medication. Avoid alcoholic drinks. If you or your family notice any changes in your behavior, such as new or worsening depression, thoughts of harming yourself, anxiety, other unusual or disturbing thoughts, or memory loss, call your care team right away. Wear a medical ID bracelet or chain if you are taking this medication for seizures. Carry a card that describes your condition. List the medications and doses you take on the card. This medication may make it more difficult to father a child. Talk to your care team if you are concerned about your fertility. What side effects may I notice from receiving this medication? Side effects that you should report to your care team as soon as possible: Allergic reactions or angioedema--skin rash, itching, hives, swelling of the face, eyes, lips, tongue, arms, or legs, trouble swallowing or breathing Blurry vision Thoughts of suicide or self-harm, worsening mood, feelings of depression Trouble breathing Side effects that usually do not require medical attention (report to your care team if they continue or are bothersome): Dizziness Drowsiness Dry mouth Nausea Swelling of the ankles, feet, hands Vomiting Weight gain This list may not describe all possible side effects. Call your doctor for medical advice about side effects. You may report side effects to FDA at 1-800-FDA-1088. Where should I keep my medication? Keep out of the reach of children and pets. This medication can be abused. Keep it in a safe place to protect it from theft. Do not share it with anyone. It is only for you. Selling or giving away this medication is dangerous and against the law. Store at [employer] [address] (77 degrees F). Get rid of any unused medication after the expiration date. This medication may cause harm and death if it is taken by other adults, children, or pets. It is important to get rid of the  medication as soon as you no longer need it, or it is expired. You can do this in two ways: Take the medication to a medication take-back program. Check with your pharmacy or law enforcement to find a location. If you cannot return the medication, check the label or package insert to see if the medication should be thrown out in the garbage or flushed down the toilet. If you are not sure, ask your care team. If it is safe to put it in the trash, take the medication out of the container. Mix the medication with cat litter, dirt, coffee grounds, or other unwanted substance. Seal the mixture in a bag or container. Put it in the trash. NOTE: This sheet is a summary. It may not cover all possible information. If you have questions about this medicine, talk to your doctor, pharmacist, or health care provider.  2023 Elsevier/Gold Standard (2020-04-26 00:00:00) Carpal Tunnel Syndrome  Carpal tunnel syndrome is a condition that causes pain, numbness, and weakness in your hand and fingers. The carpal tunnel is a narrow area located on the  palm side of your wrist. Repeated wrist motion or certain diseases may cause swelling within the tunnel. This swelling pinches the main nerve in the wrist. The main nerve in the wrist is called the median nerve. What are the causes? This condition may be caused by: Repeated and forceful wrist and hand motions. Wrist injuries. Arthritis. A cyst or tumor in the carpal tunnel. Fluid buildup during pregnancy. Use of tools that vibrate. Sometimes the cause of this condition is not known. What increases the risk? The following factors may make you more likely to develop this condition: Having a job that requires you to repeatedly or forcefully move your wrist or hand or requires you to use tools that vibrate. This may include jobs that involve using computers, working on an [employer], or working with power tools such as Radiographer, therapeutic. Being a woman. Having certain  conditions, such as: Diabetes. Obesity. An underactive thyroid (hypothyroidism). Kidney failure. Rheumatoid arthritis. What are the signs or symptoms? Symptoms of this condition include: A tingling feeling in your fingers, especially in your thumb, index, and middle fingers. Tingling or numbness in your hand. An aching feeling in your entire arm, especially when your wrist and elbow are bent for a long time. Wrist pain that goes up your arm to your shoulder. Pain that goes down into your palm or fingers. A weak feeling in your hands. You may have trouble grabbing and holding items. Your symptoms may feel worse during the night. How is this diagnosed? This condition is diagnosed with a medical history and physical exam. You may also have tests, including: Electromyogram (EMG). This test measures electrical signals sent by your nerves into the muscles. Nerve conduction study. This test measures how well electrical signals pass through your nerves. Imaging tests, such as X-rays, ultrasound, and MRI. These tests check for possible causes of your condition. How is this treated? This condition may be treated with: Lifestyle changes. It is important to stop or change the activity that caused your condition. Doing exercise and activities to strengthen and stretch your muscles and tendons (physical therapy). Making lifestyle changes to help with your condition and learning how to do your daily activities safely (occupational therapy). Medicines for pain and inflammation. This may include medicine that is injected into your wrist. A wrist splint or brace. Surgery. Follow these instructions at home: If you have a splint or brace: Wear the splint or brace as told by your health care provider. Remove it only as told by your health care provider. Loosen the splint or brace if your fingers tingle, become numb, or turn cold and blue. Keep the splint or brace clean. If the splint or brace is not  waterproof: Do not let it get wet. Cover it with a watertight covering when you take a bath or shower. Managing pain, stiffness, and swelling If directed, put ice on the painful area. To do this: If you have a removeable splint or brace, remove it as told by your health care provider. Put ice in a plastic bag. Place a towel between your skin and the bag or between the splint or brace and the bag. Leave the ice on for 20 minutes, 2-3 times a day. Do not fall asleep with the cold pack on your skin. Remove the ice if your skin turns bright red. This is very important. If you cannot feel pain, heat, or cold, you have a greater risk of damage to the area. Move your fingers often to reduce  stiffness and swelling. General instructions Take over-the-counter and prescription medicines only as told by your health care provider. Rest your wrist and hand from any activity that may be causing your pain. If your condition is work related, talk with your employer about changes that can be made, such as getting a wrist pad to use while typing. Do any exercises as told by your health care provider, physical therapist, or occupational therapist. Keep all follow-up visits. This is important. Contact a health care provider if: You have new symptoms. Your pain is not controlled with medicines. Your symptoms get worse. Get help right away if: You have severe numbness or tingling in your wrist or hand. Summary Carpal tunnel syndrome is a condition that causes pain, numbness, and weakness in your hand and fingers. It is usually caused by repeated wrist motions. Lifestyle changes and medicines are used to treat carpal tunnel syndrome. Surgery may be recommended. Follow your health care provider's instructions about wearing a splint, resting from activity, keeping follow-up visits, and calling for help. This information is not intended to replace advice given to you by your health care provider. Make sure you  discuss any questions you have with your health care provider. Document Revised: 09/02/2019 Document Reviewed: 09/02/2019 Elsevier Patient Education  2023 [employer].

## 2021-12-03 LAB — MULTIPLE MYELOMA PANEL, SERUM
Albumin SerPl Elph-Mcnc: 3.4 g/dL (ref 2.9–4.4)
Albumin/Glob SerPl: 1 (ref 0.7–1.7)
Alpha 1: 0.2 g/dL (ref 0.0–0.4)
Alpha2 Glob SerPl Elph-Mcnc: 0.8 g/dL (ref 0.4–1.0)
B-Globulin SerPl Elph-Mcnc: 1.4 g/dL — ABNORMAL HIGH (ref 0.7–1.3)
Gamma Glob SerPl Elph-Mcnc: 1.4 g/dL (ref 0.4–1.8)
Globulin, Total: 3.7 g/dL (ref 2.2–3.9)
IgA/Immunoglobulin A, Serum: 418 mg/dL — ABNORMAL HIGH (ref 87–352)
IgG (Immunoglobin G), Serum: 1464 mg/dL (ref 586–1602)
IgM (Immunoglobulin M), Srm: 65 mg/dL (ref 26–217)
Total Protein: 7.1 g/dL (ref 6.0–8.5)

## 2021-12-03 LAB — VITAMIN B6: Vitamin B6: 4.1 ug/L (ref 3.4–65.2)

## 2021-12-03 LAB — SJOGREN'S SYNDROME ANTIBODS(SSA + SSB)
ENA SSA (RO) Ab: 0.3 AI (ref 0.0–0.9)
ENA SSB (LA) Ab: <0.2 AI (ref 0.0–0.9)

## 2021-12-03 LAB — B12 AND FOLATE PANEL
Folate: 4.2 ng/mL (ref >3.0)
Vitamin B-12: 1088 pg/mL (ref 232–1245)

## 2021-12-03 LAB — HEAVY METALS, BLOOD
Arsenic: 2 ug/L (ref 0–9)
Lead, Blood: <1 ug/dL (ref 0.0–3.4)
Mercury: 1 ug/L (ref 0.0–14.9)

## 2021-12-03 LAB — ANGIOTENSIN CONVERTING ENZYME: Angio Convert Enzyme: 56 U/L (ref 14–82)

## 2021-12-03 LAB — METHYLMALONIC ACID, SERUM: Methylmalonic Acid: 154 nmol/L (ref 0–378)

## 2021-12-03 LAB — ANA: Anti Nuclear Antibody (ANA): NEGATIVE

## 2021-12-03 LAB — RHEUMATOID FACTOR: Rheumatoid fact SerPl-aCnc: <10 IU/mL (ref <14.0)

## 2021-12-04 ENCOUNTER — Telehealth: Payer: Self-pay | Admitting: *Deleted

## 2021-12-04 NOTE — Telephone Encounter (Signed)
Spoke with mother, Malachi Bonds, and let her know pt's blood work is unremarkable. She verbalized appreciation and understanding.

## 2021-12-04 NOTE — Telephone Encounter (Signed)
-----   Message from Anson Fret, MD sent at 12/04/2021 11:47 AM EDT ----- Blood work is unremarkable, thanks

## 2021-12-13 ENCOUNTER — Telehealth: Payer: Self-pay | Admitting: Neurology

## 2021-12-13 NOTE — Telephone Encounter (Signed)
Order sent back to Dr. Merrilee Seashore office

## 2021-12-19 DIAGNOSIS — G5603 Carpal tunnel syndrome, bilateral upper limbs: Secondary | ICD-10-CM | POA: Diagnosis not present

## 2022-01-02 DIAGNOSIS — F429 Obsessive-compulsive disorder, unspecified: Secondary | ICD-10-CM | POA: Diagnosis not present

## 2022-01-02 DIAGNOSIS — R69 Illness, unspecified: Secondary | ICD-10-CM | POA: Diagnosis not present

## 2022-01-23 DIAGNOSIS — G5612 Other lesions of median nerve, left upper limb: Secondary | ICD-10-CM | POA: Diagnosis not present

## 2022-02-01 DIAGNOSIS — G5602 Carpal tunnel syndrome, left upper limb: Secondary | ICD-10-CM | POA: Diagnosis not present

## 2022-02-18 IMAGING — CT CT CHEST W/O CM
2 of 3 series · 15 of 36 positions shown, 18 images · non-contrast
Comparison: Chest radiograph 11/06/2019, CT 04/21/2019

CLINICAL DATA: Follow-up pneumonia.

EXAM:
CT CHEST WITHOUT CONTRAST
TECHNIQUE: Multidetector CT imaging of the chest was performed following the
standard protocol without IV contrast.

[Series 2: thorax · axial · 0.69mm/px · z∈[-106,+132]mm · 12 of 141 slices shown, 15 images]
[im 11/141  mediastinal]
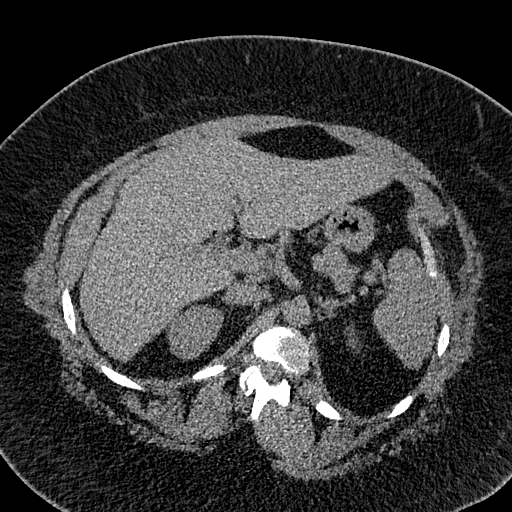
[im 11/141  lung]
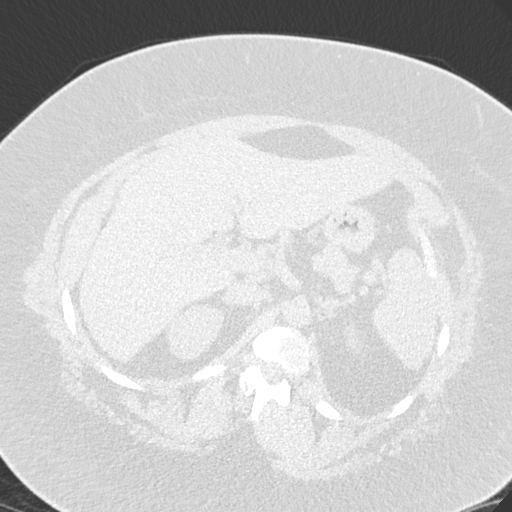
[im 21/141  lung]
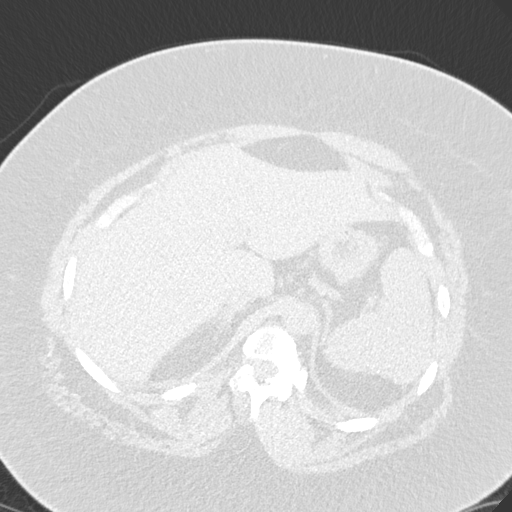
[im 32/141  lung]
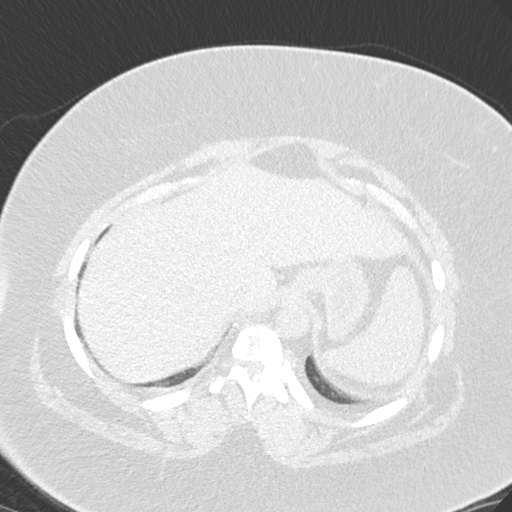
[im 42/141  lung]
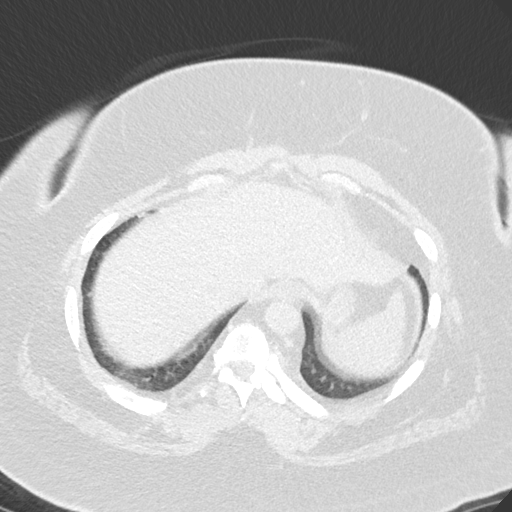
[im 52/141  mediastinal]
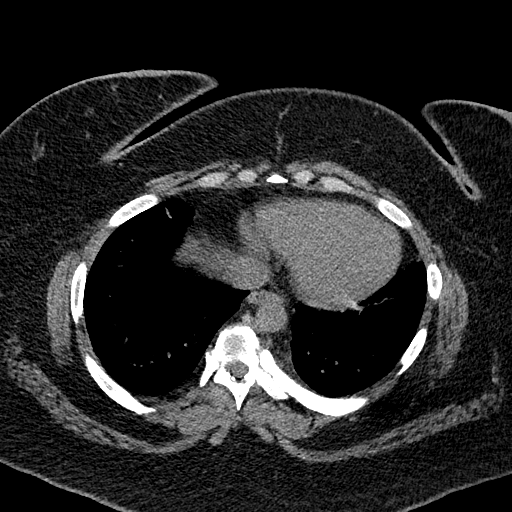
[im 52/141  lung]
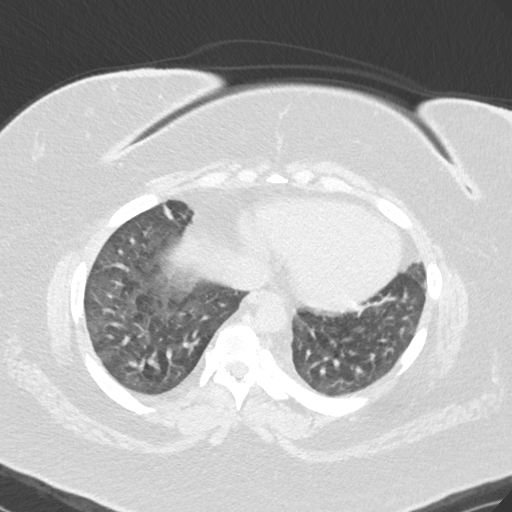
[im 63/141  lung]
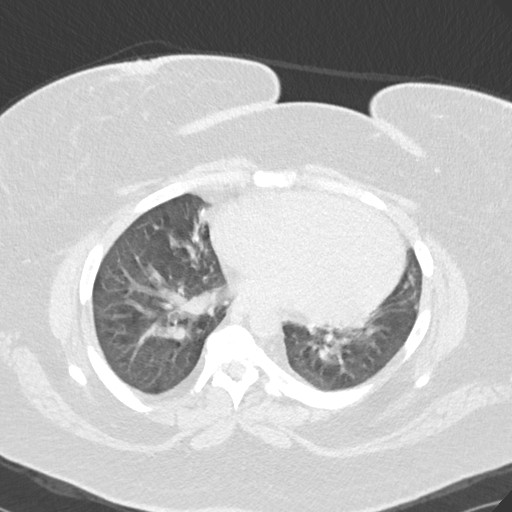
[im 78/141  lung]
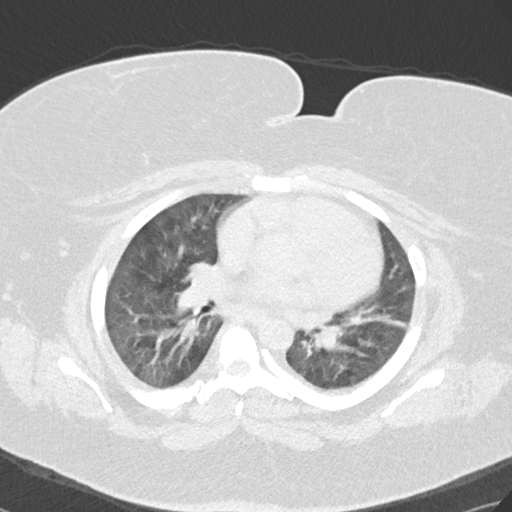
[im 89/141  lung]
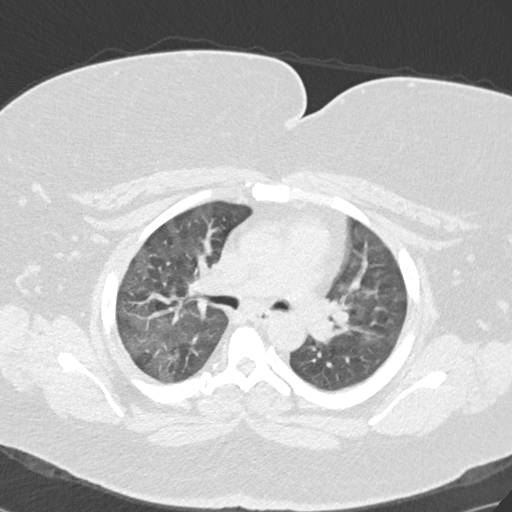
[im 99/141  mediastinal]
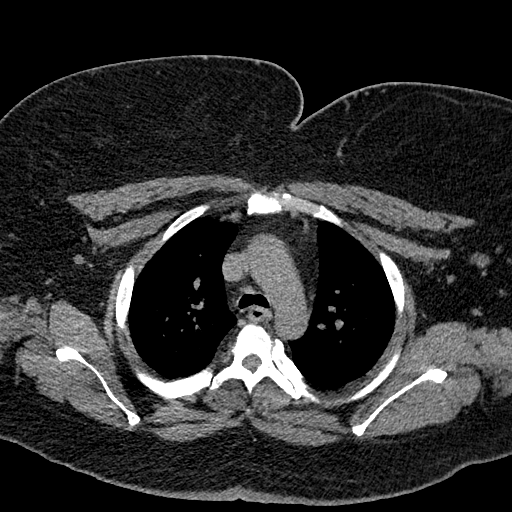
[im 99/141  lung]
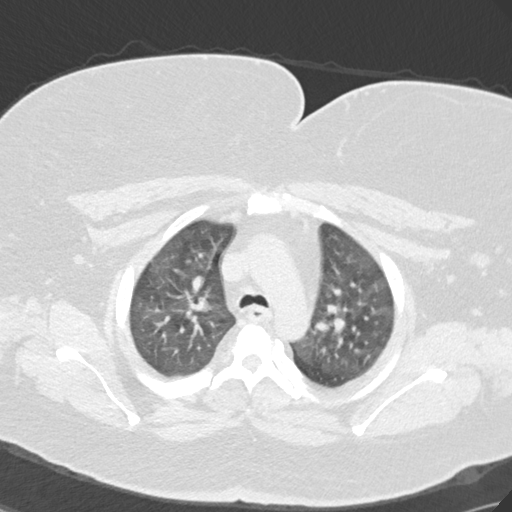
[im 109/141  lung]
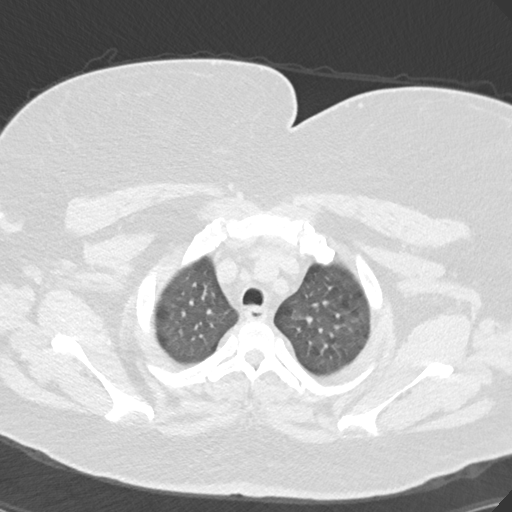
[im 120/141  lung]
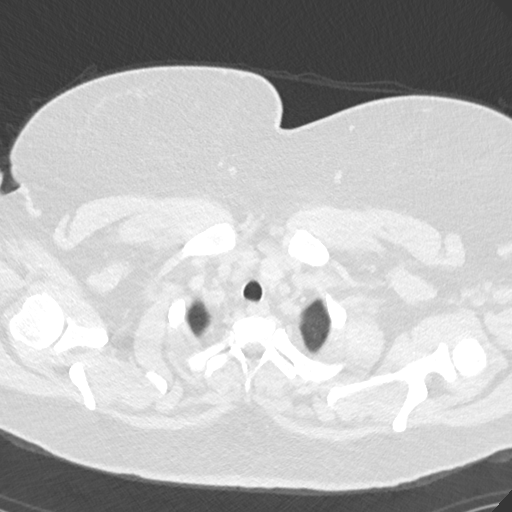
[im 130/141  lung]
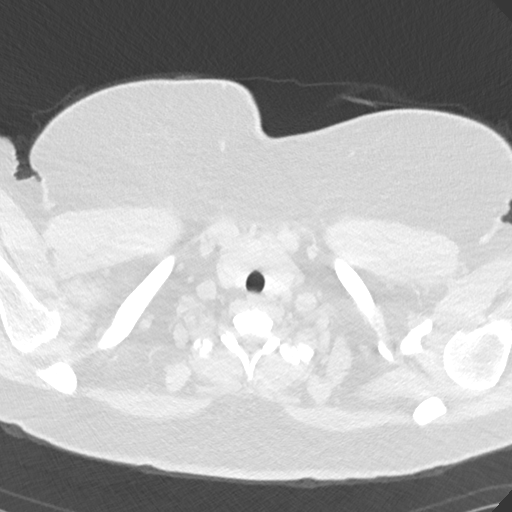

[Series 6: coronal · coronal · 0.57mm/px · 3 of 138 slices shown]
[im 28/138  lung]
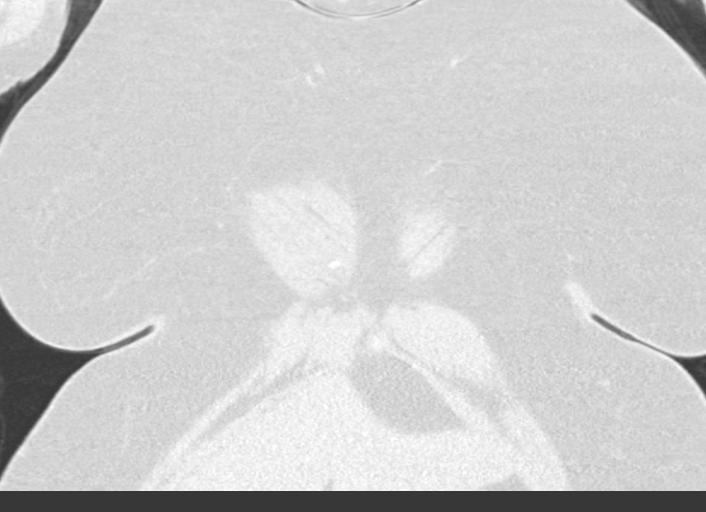
[im 55/138  lung]
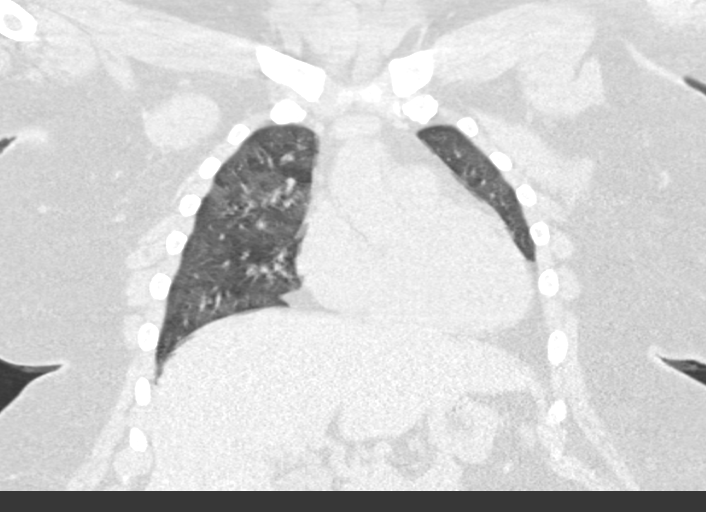
[im 83/138  lung]
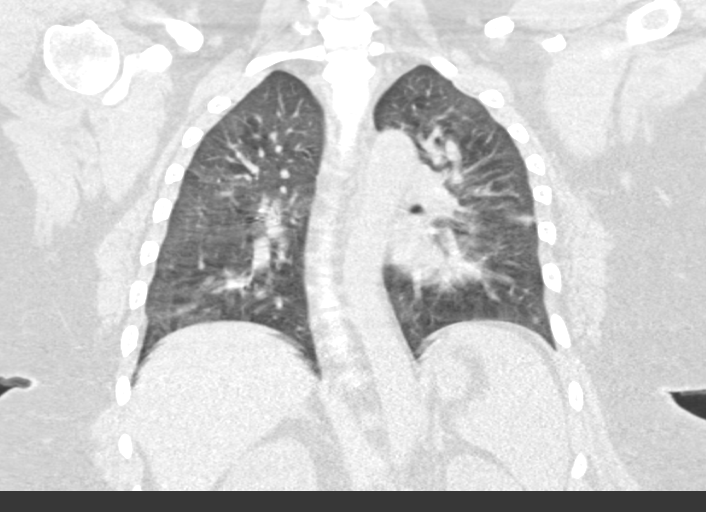

[15 of 36 positions shown; findings below may reference images not displayed]

FINDINGS: Cardiovascular: No significant vascular findings. Normal heart size.
No pericardial effusion.

Mediastinum/Nodes: No axillary or supraclavicular adenopathy. No
mediastinal or hilar adenopathy. No pericardial fluid. Esophagus
normal.

Lungs/Pleura: Interval clearing of the lower lobe opacity seen on
recent chest radiograph. Mild ground-glass opacities remain. Mild
lingular atelectasis. No clear airspace disease.

Upper Abdomen: Limited view of the liver, kidneys, pancreas are
unremarkable. Normal adrenal glands.

Musculoskeletal: No acute osseous abnormality. Thoracic spine
scoliosis.
IMPRESSION: 1. Interval clearing of the lower lung pneumonia seen on recent
chest radiograph. Mild residual ground-glass opacities.
2. Thoracic spine scoliosis.

## 2022-02-19 DIAGNOSIS — H04123 Dry eye syndrome of bilateral lacrimal glands: Secondary | ICD-10-CM | POA: Diagnosis not present

## 2022-02-26 DIAGNOSIS — R0600 Dyspnea, unspecified: Secondary | ICD-10-CM | POA: Diagnosis not present

## 2022-02-26 DIAGNOSIS — R519 Headache, unspecified: Secondary | ICD-10-CM | POA: Diagnosis not present

## 2022-02-26 DIAGNOSIS — R0602 Shortness of breath: Secondary | ICD-10-CM | POA: Diagnosis not present

## 2022-02-26 DIAGNOSIS — I10 Essential (primary) hypertension: Secondary | ICD-10-CM | POA: Diagnosis not present

## 2022-02-26 DIAGNOSIS — R000 Tachycardia, unspecified: Secondary | ICD-10-CM | POA: Diagnosis not present

## 2022-02-26 DIAGNOSIS — Z23 Encounter for immunization: Secondary | ICD-10-CM | POA: Diagnosis not present

## 2022-02-28 ENCOUNTER — Other Ambulatory Visit: Payer: Self-pay | Admitting: Internal Medicine

## 2022-02-28 ENCOUNTER — Other Ambulatory Visit (HOSPITAL_COMMUNITY): Payer: Self-pay | Admitting: Internal Medicine

## 2022-02-28 DIAGNOSIS — R519 Headache, unspecified: Secondary | ICD-10-CM

## 2022-03-04 ENCOUNTER — Ambulatory Visit (HOSPITAL_COMMUNITY): Admission: RE | Admit: 2022-03-04 | Payer: Medicare HMO | Source: Ambulatory Visit

## 2022-03-04 ENCOUNTER — Encounter (HOSPITAL_COMMUNITY): Payer: Self-pay

## 2022-03-05 DIAGNOSIS — H10023 Other mucopurulent conjunctivitis, bilateral: Secondary | ICD-10-CM | POA: Diagnosis not present

## 2022-03-07 ENCOUNTER — Inpatient Hospital Stay (HOSPITAL_COMMUNITY)
Admission: EM | Admit: 2022-03-07 | Discharge: 2022-03-12 | DRG: 189 | Disposition: A | Discharge status: Home Health | Payer: Medicare HMO | Source: Ambulatory Visit | Attending: Internal Medicine | Admitting: Internal Medicine

## 2022-03-07 ENCOUNTER — Ambulatory Visit (INDEPENDENT_AMBULATORY_CARE_PROVIDER_SITE_OTHER): Payer: Medicare HMO

## 2022-03-07 ENCOUNTER — Encounter: Payer: Self-pay | Admitting: Nurse Practitioner

## 2022-03-07 ENCOUNTER — Other Ambulatory Visit: Payer: Self-pay

## 2022-03-07 ENCOUNTER — Ambulatory Visit (INDEPENDENT_AMBULATORY_CARE_PROVIDER_SITE_OTHER): Payer: Medicare HMO | Admitting: Nurse Practitioner

## 2022-03-07 ENCOUNTER — Encounter (HOSPITAL_COMMUNITY): Payer: Self-pay

## 2022-03-07 VITALS — BP 144/82 | HR 125 | Temp 99.0°F | Ht 61.0 in | Wt 283.2 lb

## 2022-03-07 DIAGNOSIS — F79 Unspecified intellectual disabilities: Secondary | ICD-10-CM | POA: Diagnosis not present

## 2022-03-07 DIAGNOSIS — Z833 Family history of diabetes mellitus: Secondary | ICD-10-CM | POA: Diagnosis not present

## 2022-03-07 DIAGNOSIS — J069 Acute upper respiratory infection, unspecified: Secondary | ICD-10-CM | POA: Diagnosis not present

## 2022-03-07 DIAGNOSIS — I152 Hypertension secondary to endocrine disorders: Secondary | ICD-10-CM | POA: Diagnosis present

## 2022-03-07 DIAGNOSIS — R6250 Unspecified lack of expected normal physiological development in childhood: Secondary | ICD-10-CM | POA: Diagnosis not present

## 2022-03-07 DIAGNOSIS — J810 Acute pulmonary edema: Secondary | ICD-10-CM

## 2022-03-07 DIAGNOSIS — B9789 Other viral agents as the cause of diseases classified elsewhere: Secondary | ICD-10-CM | POA: Diagnosis not present

## 2022-03-07 DIAGNOSIS — Z8249 Family history of ischemic heart disease and other diseases of the circulatory system: Secondary | ICD-10-CM

## 2022-03-07 DIAGNOSIS — I517 Cardiomegaly: Secondary | ICD-10-CM | POA: Diagnosis not present

## 2022-03-07 DIAGNOSIS — I10 Essential (primary) hypertension: Secondary | ICD-10-CM | POA: Diagnosis present

## 2022-03-07 DIAGNOSIS — I5031 Acute diastolic (congestive) heart failure: Secondary | ICD-10-CM | POA: Diagnosis not present

## 2022-03-07 DIAGNOSIS — E119 Type 2 diabetes mellitus without complications: Secondary | ICD-10-CM | POA: Diagnosis present

## 2022-03-07 DIAGNOSIS — R0902 Hypoxemia: Secondary | ICD-10-CM | POA: Diagnosis not present

## 2022-03-07 DIAGNOSIS — G4489 Other headache syndrome: Secondary | ICD-10-CM | POA: Diagnosis not present

## 2022-03-07 DIAGNOSIS — F419 Anxiety disorder, unspecified: Secondary | ICD-10-CM | POA: Diagnosis present

## 2022-03-07 DIAGNOSIS — J9601 Acute respiratory failure with hypoxia: Secondary | ICD-10-CM | POA: Diagnosis not present

## 2022-03-07 DIAGNOSIS — R918 Other nonspecific abnormal finding of lung field: Secondary | ICD-10-CM | POA: Diagnosis not present

## 2022-03-07 DIAGNOSIS — M419 Scoliosis, unspecified: Secondary | ICD-10-CM | POA: Diagnosis not present

## 2022-03-07 DIAGNOSIS — R0602 Shortness of breath: Secondary | ICD-10-CM | POA: Diagnosis not present

## 2022-03-07 DIAGNOSIS — Z79899 Other long term (current) drug therapy: Secondary | ICD-10-CM | POA: Diagnosis not present

## 2022-03-07 DIAGNOSIS — R059 Cough, unspecified: Secondary | ICD-10-CM | POA: Diagnosis not present

## 2022-03-07 DIAGNOSIS — R000 Tachycardia, unspecified: Secondary | ICD-10-CM | POA: Diagnosis not present

## 2022-03-07 DIAGNOSIS — I160 Hypertensive urgency: Secondary | ICD-10-CM | POA: Diagnosis present

## 2022-03-07 DIAGNOSIS — J9610 Chronic respiratory failure, unspecified whether with hypoxia or hypercapnia: Secondary | ICD-10-CM | POA: Diagnosis present

## 2022-03-07 DIAGNOSIS — Z20822 Contact with and (suspected) exposure to covid-19: Secondary | ICD-10-CM | POA: Diagnosis not present

## 2022-03-07 DIAGNOSIS — Z7984 Long term (current) use of oral hypoglycemic drugs: Secondary | ICD-10-CM

## 2022-03-07 DIAGNOSIS — J22 Unspecified acute lower respiratory infection: Secondary | ICD-10-CM | POA: Diagnosis not present

## 2022-03-07 DIAGNOSIS — Z743 Need for continuous supervision: Secondary | ICD-10-CM | POA: Diagnosis not present

## 2022-03-07 DIAGNOSIS — J189 Pneumonia, unspecified organism: Secondary | ICD-10-CM

## 2022-03-07 DIAGNOSIS — R69 Illness, unspecified: Secondary | ICD-10-CM | POA: Diagnosis not present

## 2022-03-07 LAB — CBC WITH DIFFERENTIAL/PLATELET
Abs Immature Granulocytes: 0.17 10*3/uL — ABNORMAL HIGH (ref 0.00–0.07)
Basophils Absolute: 0.1 10*3/uL (ref 0.0–0.1)
Basophils Relative: 1 %
Eosinophils Absolute: 0 10*3/uL (ref 0.0–0.5)
Eosinophils Relative: 0 %
HCT: 44.2 % (ref 36.0–46.0)
Hemoglobin: 12.8 g/dL (ref 12.0–15.0)
Immature Granulocytes: 1 %
Lymphocytes Relative: 20 %
Lymphs Abs: 2.4 10*3/uL (ref 0.7–4.0)
MCH: 22.3 pg — ABNORMAL LOW (ref 26.0–34.0)
MCHC: 29 g/dL — ABNORMAL LOW (ref 30.0–36.0)
MCV: 77.1 fL — ABNORMAL LOW (ref 80.0–100.0)
Monocytes Absolute: 0.7 10*3/uL (ref 0.1–1.0)
Monocytes Relative: 6 %
Neutro Abs: 8.9 10*3/uL — ABNORMAL HIGH (ref 1.7–7.7)
Neutrophils Relative %: 72 %
Platelets: 233 10*3/uL (ref 150–400)
RBC: 5.73 MIL/uL — ABNORMAL HIGH (ref 3.87–5.11)
RDW: 20.8 % — ABNORMAL HIGH (ref 11.5–15.5)
WBC: 12.3 10*3/uL — ABNORMAL HIGH (ref 4.0–10.5)
nRBC: 1.2 % — ABNORMAL HIGH (ref 0.0–0.2)

## 2022-03-07 LAB — COMPREHENSIVE METABOLIC PANEL
ALT: 58 U/L — ABNORMAL HIGH (ref 0–44)
AST: 30 U/L (ref 15–41)
Albumin: 3.1 g/dL — ABNORMAL LOW (ref 3.5–5.0)
Alkaline Phosphatase: 86 U/L (ref 38–126)
Anion gap: 10 (ref 5–15)
BUN: 7 mg/dL (ref 6–20)
CO2: 30 mmol/L (ref 22–32)
Calcium: 9.1 mg/dL (ref 8.9–10.3)
Chloride: 103 mmol/L (ref 98–111)
Creatinine, Ser: 0.85 mg/dL (ref 0.44–1.00)
GFR, Estimated: >60 mL/min (ref >60)
Glucose, Bld: 148 mg/dL — ABNORMAL HIGH (ref 70–99)
Potassium: 4.2 mmol/L (ref 3.5–5.1)
Sodium: 143 mmol/L (ref 135–145)
Total Bilirubin: 2.2 mg/dL — ABNORMAL HIGH (ref 0.3–1.2)
Total Protein: 6.6 g/dL (ref 6.5–8.1)

## 2022-03-07 LAB — I-STAT ARTERIAL BLOOD GAS, ED
Acid-Base Excess: 6 mmol/L — ABNORMAL HIGH (ref 0.0–2.0)
Bicarbonate: 31 mmol/L — ABNORMAL HIGH (ref 20.0–28.0)
Calcium, Ion: 1.2 mmol/L (ref 1.15–1.40)
HCT: 40 % (ref 36.0–46.0)
Hemoglobin: 13.6 g/dL (ref 12.0–15.0)
O2 Saturation: 97 %
Patient temperature: 98.2
Potassium: 4.2 mmol/L (ref 3.5–5.1)
Sodium: 137 mmol/L (ref 135–145)
TCO2: 32 mmol/L (ref 22–32)
pCO2 arterial: 43.9 mmHg (ref 32–48)
pH, Arterial: 7.456 — ABNORMAL HIGH (ref 7.35–7.45)
pO2, Arterial: 83 mmHg (ref 83–108)

## 2022-03-07 LAB — TROPONIN I (HIGH SENSITIVITY)
Troponin I (High Sensitivity): 14 ng/L (ref <18)
Troponin I (High Sensitivity): 13 ng/L (ref <18)

## 2022-03-07 LAB — CBC
HCT: 43 % (ref 36.0–46.0)
Hemoglobin: 12.7 g/dL (ref 12.0–15.0)
MCH: 22.8 pg — ABNORMAL LOW (ref 26.0–34.0)
MCHC: 29.5 g/dL — ABNORMAL LOW (ref 30.0–36.0)
MCV: 77.3 fL — ABNORMAL LOW (ref 80.0–100.0)
Platelets: 214 10*3/uL (ref 150–400)
RBC: 5.56 MIL/uL — ABNORMAL HIGH (ref 3.87–5.11)
RDW: 20.8 % — ABNORMAL HIGH (ref 11.5–15.5)
WBC: 13.3 10*3/uL — ABNORMAL HIGH (ref 4.0–10.5)
nRBC: 1.1 % — ABNORMAL HIGH (ref 0.0–0.2)

## 2022-03-07 LAB — LACTIC ACID, PLASMA: Lactic Acid, Venous: 1.3 mmol/L (ref 0.5–1.9)

## 2022-03-07 LAB — D-DIMER, QUANTITATIVE: D-Dimer, Quant: 0.47 ug/mL-FEU (ref 0.00–0.50)

## 2022-03-07 LAB — SARS CORONAVIRUS 2 BY RT PCR: SARS Coronavirus 2 by RT PCR: NEGATIVE

## 2022-03-07 LAB — CREATININE, SERUM
Creatinine, Ser: 0.84 mg/dL (ref 0.44–1.00)
GFR, Estimated: >60 mL/min (ref >60)

## 2022-03-07 LAB — BRAIN NATRIURETIC PEPTIDE: B Natriuretic Peptide: 82.6 pg/mL (ref 0.0–100.0)

## 2022-03-07 LAB — MRSA NEXT GEN BY PCR, NASAL: MRSA by PCR Next Gen: NOT DETECTED

## 2022-03-07 LAB — HIV ANTIBODY (ROUTINE TESTING W REFLEX): HIV Screen 4th Generation wRfx: NONREACTIVE

## 2022-03-07 MED ORDER — SODIUM CHLORIDE 0.9 % IV SOLN: 250.0000 mL | INTRAVENOUS | Status: DC | PRN

## 2022-03-07 MED ORDER — ACETAMINOPHEN 325 MG PO TABS
650.0000 mg | ORAL_TABLET | Freq: Once | ORAL | Status: AC
  Administered 2022-03-07: 650 mg via ORAL
  Filled 2022-03-07: qty 2

## 2022-03-07 MED ORDER — VANCOMYCIN HCL 10 G IV SOLR
2000.0000 mg | Freq: Once | INTRAVENOUS | Status: AC
  Administered 2022-03-07: 2000 mg via INTRAVENOUS
  Filled 2022-03-07: qty 20

## 2022-03-07 MED ORDER — SODIUM CHLORIDE 0.9% FLUSH
3.0000 mL | Freq: Two times a day (BID) | INTRAVENOUS | Status: DC
  Administered 2022-03-07 – 2022-03-12 (×10): 3 mL via INTRAVENOUS

## 2022-03-07 MED ORDER — DAPAGLIFLOZIN PROPANEDIOL 5 MG PO TABS
5.0000 mg | ORAL_TABLET | Freq: Every day | ORAL | Status: DC
  Filled 2022-03-07: qty 1

## 2022-03-07 MED ORDER — PREGABALIN 100 MG PO CAPS
200.0000 mg | ORAL_CAPSULE | Freq: Two times a day (BID) | ORAL | Status: DC
  Administered 2022-03-07 – 2022-03-12 (×10): 200 mg via ORAL
  Filled 2022-03-07 (×10): qty 2

## 2022-03-07 MED ORDER — ACETAMINOPHEN 325 MG PO TABS
650.0000 mg | ORAL_TABLET | ORAL | Status: DC | PRN
  Administered 2022-03-07 – 2022-03-12 (×12): 650 mg via ORAL
  Filled 2022-03-07 (×15): qty 2

## 2022-03-07 MED ORDER — SODIUM CHLORIDE 0.9 % IV SOLN
1.0000 g | Freq: Once | INTRAVENOUS | Status: AC
  Administered 2022-03-07: 1 g via INTRAVENOUS
  Filled 2022-03-07: qty 10

## 2022-03-07 MED ORDER — FUROSEMIDE 10 MG/ML IJ SOLN
40.0000 mg | Freq: Once | INTRAMUSCULAR | Status: AC
  Administered 2022-03-07: 40 mg via INTRAVENOUS
  Filled 2022-03-07: qty 4

## 2022-03-07 MED ORDER — SODIUM CHLORIDE 0.9% FLUSH: 3.0000 mL | INTRAVENOUS | Status: DC | PRN

## 2022-03-07 MED ORDER — ENOXAPARIN SODIUM 80 MG/0.8ML IJ SOSY
0.5000 mg/kg | PREFILLED_SYRINGE | INTRAMUSCULAR | Status: DC
  Administered 2022-03-07 – 2022-03-10 (×4): 65 mg via SUBCUTANEOUS
  Filled 2022-03-07: qty 0.65
  Filled 2022-03-07 (×2): qty 0.8
  Filled 2022-03-07: qty 0.65
  Filled 2022-03-07: qty 0.8

## 2022-03-07 MED ORDER — LOSARTAN POTASSIUM 50 MG PO TABS
100.0000 mg | ORAL_TABLET | Freq: Every day | ORAL | Status: DC
  Administered 2022-03-08 – 2022-03-12 (×5): 100 mg via ORAL
  Filled 2022-03-07 (×5): qty 2

## 2022-03-07 MED ORDER — FUROSEMIDE 10 MG/ML IJ SOLN
40.0000 mg | Freq: Every day | INTRAMUSCULAR | Status: DC
  Filled 2022-03-07: qty 4

## 2022-03-07 MED ORDER — VANCOMYCIN HCL 10 G IV SOLR
1750.0000 mg | INTRAVENOUS | Status: DC
  Filled 2022-03-07: qty 17.5

## 2022-03-07 MED ORDER — POLYMYXIN B-TRIMETHOPRIM 10000-0.1 UNIT/ML-% OP SOLN
1.0000 [drp] | Freq: Four times a day (QID) | OPHTHALMIC | Status: DC
  Administered 2022-03-08 – 2022-03-12 (×14): 1 [drp] via OPHTHALMIC
  Filled 2022-03-07 (×2): qty 10

## 2022-03-07 MED ORDER — ACETAMINOPHEN 500 MG PO TABS: 1000.0000 mg | ORAL_TABLET | Freq: Four times a day (QID) | ORAL | Status: DC | PRN

## 2022-03-07 MED ORDER — LOSARTAN POTASSIUM 50 MG PO TABS: 100.0000 mg | ORAL_TABLET | Freq: Every day | ORAL | Status: DC

## 2022-03-07 MED ORDER — ONDANSETRON HCL 4 MG/2ML IJ SOLN
4.0000 mg | Freq: Four times a day (QID) | INTRAMUSCULAR | Status: DC | PRN
  Administered 2022-03-09 – 2022-03-10 (×2): 4 mg via INTRAVENOUS
  Filled 2022-03-07 (×2): qty 2

## 2022-03-07 NOTE — Assessment & Plan Note (Addendum)
Bilateral opacities on imaging, likely multifocal pna given infectious symptoms with superimposed pulmonary edema. She meets criteria for sepsis with tachycardia, tachypnea and ? fevers (99 F at OV after acetaminophen) as well as known infectious source. She also has known sick exposure. Unfortunately, she has associated hypoxia and difficulties maintaining saturations with activity, despite high levels of supplemental O2. She is not appropriate for outpatient therapy. She will need further workup/evaluation with hospital admission. EMS contacted for transport. Contacted [facility] ED to notify of impending admission; Spoke with Noah Delaine, Agricultural consultant. Dr Lamonte Sakai unavailable; Dr [facility] aware and agrees with above plan.

## 2022-03-07 NOTE — Assessment & Plan Note (Addendum)
Hypoxic to the 70's upon arrival to exam room. Rechecked on forehead probe with sats remaining in the 70's. Placed on nasal cannula; walking oximetry with desaturations to the low 80's on 6 lpm. Unable to stabilize saturations due to mouth breathing. Transitioned to NRB on 10lpm with sats in the 90's. She is tachypneic and tachycardic as well.

## 2022-03-07 NOTE — ED Provider Notes (Signed)
[facility] EMERGENCY DEPARTMENT Provider Note   CSN: [id number] Arrival date & time: 03/07/22  1546     History  No chief complaint on file.   Regina Wood is a 41 y.o. female.  Patient with a history of developmental delay, diabetes, obesity sent from pulmonary office with hypoxia and shortness of breath.  Patient has had pneumonia in the past and was being seen in pulmonary office today with a several week history of cough and congestion.  She was found to be hypoxic in the 70s and required 5 L nasal cannula oxygen administered by EMS.  Denies any history of asthma or COPD.  Denies any chest pain.  Unknown if she has had fever.  Mother at bedside provides most of the history and states patient has had cough, congestion, headaches, runny nose for several weeks.  Noticed increased work of breathing today at pulmonary office with hypoxia and EMS was called. Has had sick contacts at home. No abdominal pain, nausea or vomiting.  No leg pain or leg swelling.  No history of COPD or CHF.  The history is provided by the patient, a relative and the EMS personnel. The history is limited by the condition of the patient.       Home Medications Prior to Admission medications   Medication Sig Start Date End Date Taking? Authorizing Provider  blood glucose meter kit and supplies Dispense based on patient and insurance preference. Use up to four times daily as directed. (FOR ICD-10 E10.9, E11.9). 04/24/19   Dessa Phi, DO  Cyanocobalamin (VITAMIN B 12 PO) Take by mouth daily.    [provider]  losartan (COZAAR) 100 MG tablet Take 100 mg by mouth daily. 10/05/21   [provider]  metFORMIN (GLUCOPHAGE) 500 MG tablet Take 1 tablet (500 mg total) by mouth 2 (two) times daily with a meal. 04/24/19 11/06/19  Dessa Phi, DO  pregabalin (LYRICA) 200 MG capsule Take 1 capsule (200 mg total) by mouth 2 (two) times daily. 11/26/21   Melvenia Beam, MD       Allergies    Patient has no known allergies.    Review of Systems   Review of Systems  Constitutional:  Negative for fever.  HENT:  Positive for congestion and rhinorrhea.   Respiratory:  Positive for cough and shortness of breath.   Cardiovascular:  Negative for chest pain.  Gastrointestinal:  Negative for abdominal pain, nausea and vomiting.  Genitourinary:  Negative for dysuria and hematuria.  Musculoskeletal:  Negative for arthralgias and myalgias.  Neurological:  Negative for dizziness, weakness and headaches.   all other systems are negative except as noted in the HPI and PMH.    Physical Exam Updated Vital Signs There were no vitals taken for this visit. Physical Exam Vitals and nursing note reviewed.  Constitutional:      General: She is not in acute distress.    Appearance: She is well-developed. She is obese. She is ill-appearing.     Comments: Increased work of breathing, dyspnea with conversation  HENT:     Head: Normocephalic and atraumatic.     Mouth/Throat:     Pharynx: No oropharyngeal exudate.  Eyes:     Conjunctiva/sclera: Conjunctivae normal.     Pupils: Pupils are equal, round, and reactive to light.  Neck:     Comments: No meningismus. Cardiovascular:     Rate and Rhythm: Regular rhythm. Tachycardia present.     Heart sounds: Normal heart sounds.  No murmur heard. Pulmonary:     Effort: Respiratory distress present.     Breath sounds: Rhonchi present.     Comments: Scattered rhonchi bilaterally Abdominal:     Palpations: Abdomen is soft.     Tenderness: There is no abdominal tenderness. There is no guarding or rebound.  Musculoskeletal:        General: No tenderness. Normal range of motion.     Cervical back: Normal range of motion and neck supple.  Skin:    General: Skin is warm.  Neurological:     Mental Status: She is alert and oriented to person, place, and time.     Cranial Nerves: No cranial nerve deficit.     Motor: No abnormal muscle  tone.     Coordination: Coordination normal.     Comments:  5/5 strength throughout. CN 2-12 intact.Equal grip strength.   Psychiatric:        Behavior: Behavior normal.     ED Results / Procedures / Treatments   Labs (all labs ordered are listed, but only abnormal results are displayed) Labs Reviewed  CBC WITH DIFFERENTIAL/PLATELET - Abnormal; Notable for the following components:      Result Value   WBC 12.3 (*)    RBC 5.73 (*)    MCV 77.1 (*)    MCH 22.3 (*)    MCHC 29.0 (*)    RDW 20.8 (*)    nRBC 1.2 (*)    Neutro Abs 8.9 (*)    Abs Immature Granulocytes 0.17 (*)    All other components within normal limits  COMPREHENSIVE METABOLIC PANEL - Abnormal; Notable for the following components:   Glucose, Bld 148 (*)    Albumin 3.1 (*)    ALT 58 (*)    Total Bilirubin 2.2 (*)    All other components within normal limits  I-STAT ARTERIAL BLOOD GAS, ED - Abnormal; Notable for the following components:   pH, Arterial 7.456 (*)    Bicarbonate 31.0 (*)    Acid-Base Excess 6.0 (*)    All other components within normal limits  SARS CORONAVIRUS 2 BY RT PCR  CULTURE, BLOOD (ROUTINE X 2)  CULTURE, BLOOD (ROUTINE X 2)  MRSA NEXT GEN BY PCR, NASAL  LACTIC ACID, PLASMA  BRAIN NATRIURETIC PEPTIDE  D-DIMER, QUANTITATIVE  LACTIC ACID, PLASMA  HIV ANTIBODY (ROUTINE TESTING W REFLEX)  BASIC METABOLIC PANEL  CBC  CREATININE, SERUM  TROPONIN I (HIGH SENSITIVITY)  TROPONIN I (HIGH SENSITIVITY)    EKG EKG Interpretation  Date/Time:  Thursday March 07 2022 16:53:00 EDT Ventricular Rate:  106 PR Interval:  106 QRS Duration: 72 QT Interval:  325 QTC Calculation: 432 R Axis:   100 Text Interpretation: Sinus tachycardia Right axis deviation Abnormal R-wave progression, early transition Borderline repolarization abnormality No significant change was found Confirmed by Ezequiel Essex [phone]) on 03/07/2022 5:57:30 PM  Radiology DG Chest 2 View  Result Date: 03/07/2022 CLINICAL  DATA:  Cough, short of breath, new onset hypoxia EXAM: CHEST - 2 VIEW COMPARISON:  11/06/2019 FINDINGS: Frontal and lateral views of the chest demonstrate stable enlargement the cardiac silhouette. There is increased central vascular congestion with bilateral perihilar airspace disease. No effusion or pneumothorax. No acute bony abnormalities. IMPRESSION: 1. Constellation of findings suggesting mild congestive heart failure. Underlying infection would be difficult to exclude. Electronically Signed   By: Randa Ngo M.D.   On: 03/07/2022 14:44    Procedures .Critical Care  Performed by: Ezequiel Essex, MD Authorized by: Wyvonnia Dusky,  Annie Main, MD   Critical care provider statement:    Critical care time (minutes):  35   Critical care time was exclusive of:  Separately billable procedures and treating other patients   Critical care was necessary to treat or prevent imminent or life-threatening deterioration of the following conditions:  Respiratory failure   Critical care was time spent personally by me on the following activities:  Development of treatment plan with patient or surrogate, discussions with consultants, evaluation of patient's response to treatment, examination of patient, ordering and review of laboratory studies, ordering and review of radiographic studies, ordering and performing treatments and interventions, pulse oximetry, re-evaluation of patient's condition, review of old charts and blood draw for specimens   I assumed direction of critical care for this patient from another provider in my specialty: no     Care discussed with: admitting provider       Medications Ordered in ED Medications - No data to display  ED Course/ Medical Decision Making/ A&P                           Medical Decision Making Amount and/or Complexity of Data Reviewed Independent Historian: EMS Labs: ordered. Decision-making details documented in ED Course. Radiology: ordered and independent  interpretation performed. Decision-making details documented in ED Course. ECG/medicine tests: ordered and independent interpretation performed. Decision-making details documented in ED Course.  Risk OTC drugs. Prescription drug management. Decision regarding hospitalization.   From pulmonary office with cough, congestion, shortness of breath and hypoxia.  Maintaining oxygen saturation on 5 L nasal cannula.  We will proceed with infectious work-up with concern for pneumonia.  Chest x-ray from pulmonary clinic reviewed and shows pulmonary edema with airspace disease bilaterally.  Suspect likely CHF but cannot rule out superimposed pneumonia.  Patient started on broad-spectrum antibiotics after cultures are obtained.  EKG without acute ischemia.  ABG shows no significant CO2 retention.  She was requiring 5 L of oxygen with so we will hold off on BiPAP at this time. Given pulmonary edema pattern on chest x-ray, will avoid large volume IV fluids at this time.  Lactate is normal.  White blood cell count is 12  Continue broad-spectrum antibiotics and IV Lasix.  Plan admission for apparently new onset CHF exacerbation, cannot rule out pneumonia.  Discussed with Dr. Jonelle Sidle.       Final Clinical Impression(s) / ED Diagnoses Final diagnoses:  Acute hypoxic respiratory failure ([place])  Acute pulmonary edema [facility])    Rx / DC Orders ED Discharge Orders     None         Henriette Hesser, Annie Main, MD 03/07/22 2103

## 2022-03-07 NOTE — Progress Notes (Signed)
Pharmacy Antibiotic Note  Regina Wood is a 41 y.o. female admitted on 03/07/2022 with pneumonia.  Pharmacy has been consulted for vancomycin dosing.  Patient presents from [facility] pulmonary care after experiencing approx. 2 weeks of productive cough, shortness of breath, and poor oral intake. During appointment, patient became hypoxic to the 70s and placed on 5L nasal cannula. CXR from pulmonary clinic showed pulmonary edema but cannot rule out superimposed pneumonia. On presentation, patient is afebrile, WBC elevated at 12.3. Team will start broad spectrum antibiotics for pneumonia coverage.  Plan: Give vancomycin 2000mg  IV x1 Start vancomycin 1750mg  IV q24 hours (Scr 0.85, eAUC 457, Vd 0.5) Cefepime 1g IV x1 Monitor clinical status, renal function, culture results, and LOT    Temp (24hrs), Avg:99 F (37.2 C), Min:99 F (37.2 C), Max:99 F (37.2 C)  No results for input(s): "WBC", "CREATININE", "LATICACIDVEN", "VANCOTROUGH", "VANCOPEAK", "VANCORANDOM", "GENTTROUGH", "GENTPEAK", "GENTRANDOM", "TOBRATROUGH", "TOBRAPEAK", "TOBRARND", "AMIKACINPEAK", "AMIKACINTROU", "AMIKACIN" in the last 168 hours.  CrCl cannot be calculated (Patient's most recent lab result is older than the maximum 21 days allowed.).    No Known Allergies  Antimicrobials this admission: vancomycin 11/2 >>  cefepime 11/2 >>   Dose adjustments this admission: N/A  Microbiology results: 11/2 BCx:  11/2 COVID-19 nasal swab:  Thank you for allowing pharmacy to be a part of this patient's care.  Louanne Belton, PharmD, [facility] PGY1 Pharmacy Resident 03/07/2022 4:21 PM

## 2022-03-07 NOTE — H&P (Signed)
History and Physical    Patient: Regina Wood INO:[id number] DOB: 10-Jan-1981 DOA: 03/07/2022 DOS: the patient was seen and examined on 03/07/2022 PCP: Michael Boston, MD  Patient coming from: Home  Chief Complaint:  Chief Complaint  Patient presents with   SOB   Hypoxia   HPI: Regina Wood is a 41 y.o. female with medical history significant of mental retardation, morbid obesity, diabetes was brought in by family secondary to shortness of breath and cough.  Patient is unable to give history and she is very anxious.  Patient has apparently had recent pneumonia.  She was followed by pulmonology.  Was seen in the office today due to abnormal CT chest scan.  She had history of hypertension.  Noted to be having worsening shortness of breath.  Was sent to the ER for further evaluation of possible pneumonia versus CHF.  She has had progressive cough and congestion for a while.  In the office she was hypoxic to 70s and was requiring about 5 L of oxygen by nasal cannula.  No prior history of COPD asthma or CHF.  Patient transported to the ER for further evaluation.  In the ER she remained hypoxic.  Initial work-up indicated possible cardiac cause of her symptoms.  Patient therefore being admitted for further evaluation and treatment.  She appears to have new onset CHF that was not previously diagnosed.  Review of Systems: As mentioned in the history of present illness. All other systems reviewed and are negative. Past Medical History:  Diagnosis Date   Diabetes mellitus without complication ([place])    Obesity    Pneumonia    History reviewed. No pertinent surgical history. Social History:  reports that she has never smoked. She has never used smokeless tobacco. She reports current alcohol use. She reports that she does not use drugs.  No Known Allergies  Family History  Problem Relation Age of Onset   Healthy Mother    Migraines Mother    Hypertension Father    Diabetes Father     Neuropathy Neg Hx     Prior to Admission medications   Medication Sig Start Date End Date Taking? Authorizing Provider  acetaminophen (TYLENOL) 500 MG tablet Take 1,000 mg by mouth every 6 (six) hours as needed for moderate pain.   Yes [provider]  Cyanocobalamin (VITAMIN B 12 PO) Take 1 tablet by mouth daily.   Yes [provider]  dapagliflozin propanediol (FARXIGA) 5 MG TABS tablet Take 1 tablet by mouth daily. 06/11/21  Yes [provider]  losartan (COZAAR) 100 MG tablet Take 100 mg by mouth daily. 10/05/21  Yes [provider]  metFORMIN (GLUCOPHAGE) 500 MG tablet Take 1 tablet (500 mg total) by mouth 2 (two) times daily with a meal. 04/24/19 03/07/22 Yes Dessa Phi, DO  pregabalin (LYRICA) 200 MG capsule Take 1 capsule (200 mg total) by mouth 2 (two) times daily. 11/26/21  Yes Melvenia Beam, MD  trimethoprim-polymyxin b (POLYTRIM) ophthalmic solution Place 1 drop into both eyes 4 (four) times daily. 03/05/22  Yes [provider]  blood glucose meter kit and supplies Dispense based on patient and insurance preference. Use up to four times daily as directed. (FOR ICD-10 E10.9, E11.9). 04/24/19   Dessa Phi, DO    Physical Exam: Vitals:   03/07/22 1654 03/07/22 1715 03/07/22 1731 03/07/22 1835  BP:  (!) 120/105    Pulse:  (!) 102  98  Resp:  (!) 23  16  Temp: 98.2 F (36.8 C)     TempSrc: Oral     SpO2:  96%  96%  Weight:   128.5 kg   Height:   _0  (1.549 m)    Constitutional: Obese, developmental delay, mild distress Eyes: PERRL, lids and conjunctivae normal ENMT: Mucous membranes are moist. Posterior pharynx clear of any exudate or lesions.Normal dentition.  Neck: normal, supple, no masses, no thyromegaly Respiratory: Decreased air entry with some Rales and crackles no accessory muscle use.  Cardiovascular: Sinus tachycardia, no murmurs / rubs / gallops. No extremity edema. 2+ pedal pulses. No carotid bruits.  Abdomen:  Obese, no tenderness, no masses palpated. No hepatosplenomegaly. Bowel sounds positive.  Musculoskeletal: Good range of motion, no joint swelling or tenderness, Skin: no rashes, lesions, ulcers. No induration Neurologic: CN 2-12 grossly intact. Sensation intact, DTR normal. Strength 5/5 in all 4.  Psychiatric: Confused, mildly agitated  Data Reviewed:  ABG showed a pH of 7.456, PCO2 44 and PO2 83 on 5 L.  Chemistry showed albumin 3.1 and glucose 148 the rest of chemistry appeared to be within normal.  White count is 13.3 hemoglobin 12.7 and platelet count of 214.  Acute viral screen including COVID-19 and influenza is so far negative.  Chest x-ray showed constellation of findings suggestive of mild congestive heart failure.  EKG showed normal sinus rhythm.  Assessment and Plan:  #1 acute hypoxic respiratory failure: Suspected due to new onset CHF.  Patient will be admitted.  Mild diuresis.  Echocardiogram in the morning.  Continue to titrate oxygen off.  Depending on echo results next steps will be addressed including possible cardiology consultation.  #2 type 2 diabetes: Sliding scale insulin patient on Farxiga.  Hold metformin  #3 essential hypertension: On Lasix and losartan.  4 obesity: Dietary counseling  #5 mental retardation: Appears to be at baseline.  Continue to monitor     Advance Care Planning:   Code Status: Prior full code  Consults: None at the moment  Family Communication: Mother and father at bedside  Severity of Illness: The appropriate patient status for this patient is INPATIENT. Inpatient status is judged to be reasonable and necessary in order to provide the required intensity of service to ensure the patient's safety. The patient's presenting symptoms, physical exam findings, and initial radiographic and laboratory data in the context of their chronic comorbidities is felt to place them at high risk for further clinical deterioration. Furthermore, it is not  anticipated that the patient will be medically stable for discharge from the hospital within 2 midnights of admission.   * I certify that at the point of admission it is my clinical judgment that the patient will require inpatient hospital care spanning beyond 2 midnights from the point of admission due to high intensity of service, high risk for further deterioration and high frequency of surveillance required.*  AuthorBarbette Merino, MD 03/07/2022 7:54 PM  For on call review [email or web address].

## 2022-03-07 NOTE — ED Triage Notes (Signed)
Pt BIB [facility] from PCP d/t SOB, cough, body aches, & HA for the past 2 wks. Hx of PNE does have down syndrome. 144/82 & O2 was on the 70's on RA. PCP put her on NRB & her sats came up to 100%. Does have DM & her CBG was 166 & temp was 99. Mother at bedsied, pt is very anxious.

## 2022-03-07 NOTE — Progress Notes (Signed)
_0  ID: Jeanice Lim, female    DOB: May 04, 1981, 41 y.o.   MRN: [id number]  Chief Complaint  Patient presents with   Follow-up    Pr f/u she is having increased SOB, coughing, neck stiffness, and headaches for approx 2 weeks.     Referring provider: Michael Boston, MD  HPI: 41 year old female, never smoker followed for pneumonia and abnormal CT chest scan. She is a patient of Dr. Agustina Caroli and last seen in office 12/30/2021. Past medical history significant for developmental delay, obesity, DM, HTN.  TEST/EVENTS:  04/21/2019 CTA chest: no evidence of PE. Small mediastinal lymph nodes, not enlarged. Esophagus is decompressed. There is ground-glass throughout both lungs. Superimposed linear opacities in the RLL, RUL, and LUL. Central bronchial thickening 11/06/2019 CXR 2 view: limited exam but there was appearance of RML consolidation  01/20/2020 CT chest wo contrast: interval clearing of the lower lobe opacity. Mild ground-glass opacities remain with mild lingular atelectasis.   12/31/2019: OV with Dr. Lamonte Sakai. Admitted in December for CAP and discharged home on O2. Back in ED in July with symptoms of SOB, fever, headaches and possible cough. RML infiltrate identified on imaging and treated with levaquin. Does not believe any symptoms currently. Possible risk for dysphagia/aspiration. Plan to repeat CT chest to assess for resolution.   05/12/2020: Pt was unable to attend appt but Dr. Lamonte Sakai reviewed CT imaging with her mother. CT was completed 01/21/2020. Areas of consolidation and pneumonia have improved. Does still have some mild scattered groundglass, questionable air-trapping. These changes date back to 2013. Unsure she would be able to tolerate/perform PFTs given her developmental delays. Currently asymptomatic. Hold off on repeat imaging or PFT unless she develops symptoms.   03/07/2022: Today - acute Patient presents today with her mother who provides most of her history. She developed a  cough around two weeks ago; productive but she's unsure of the color of sputum. She also noticed that her breathing seemed more labored; she describes it as panting. She's also had some headaches and reported neck stiffness. She's been giving her tylenol for this. Unsure if she's had any fevers. She is not eating much and taking in limited amounts of fluids. She's had minimal improvement and seems to be breathing faster so her mom wanted her to come in for further evaluation. Denies hemoptysis, increased leg swelling, orthopnea. Her niece is sick with cold like symptoms and a cough. They did not complete any viral testing.   No Known Allergies  There is no immunization history for the selected administration types on file for this patient.  Past Medical History:  Diagnosis Date   Diabetes mellitus without complication ([place])    Obesity    Pneumonia     Tobacco History: Social History   Tobacco Use  Smoking Status Never  Smokeless Tobacco Never   Counseling given: Not Answered   Outpatient Medications Prior to Visit  Medication Sig Dispense Refill   blood glucose meter kit and supplies Dispense based on patient and insurance preference. Use up to four times daily as directed. (FOR ICD-10 E10.9, E11.9). 1 each 0   Cyanocobalamin (VITAMIN B 12 PO) Take by mouth daily.     losartan (COZAAR) 100 MG tablet Take 100 mg by mouth daily.     pregabalin (LYRICA) 200 MG capsule Take 1 capsule (200 mg total) by mouth 2 (two) times daily. 60 capsule 3   Probiotic Product ([place]) Take by mouth daily at 2 PM.  metFORMIN (GLUCOPHAGE) 500 MG tablet Take 1 tablet (500 mg total) by mouth 2 (two) times daily with a meal. 60 tablet 2   benztropine (COGENTIN) 1 MG tablet Take 1 mg by mouth daily. (Patient not taking: Reported on 03/07/2022)     No facility-administered medications prior to visit.     Review of Systems:   Constitutional: No weight loss or gain, night sweats,  fevers, chills, or lassitude. +fatigue, sweats, body aches HEENT: No difficulty swallowing, or sore throat. No sneezing, itching, ear ache, nasal congestion. +headaches, neck stiffness CV:  No chest pain, orthopnea, PND, swelling in lower extremities, anasarca, dizziness, syncope Resp: +shortness of breath at rest; congested cough. No hemoptysis. No wheezing.  No chest wall deformity GI:  No heartburn, indigestion, abdominal pain, nausea, vomiting, diarrhea, change in bowel habits, loss of appetite, bloody stools.  GU: No dysuria, change in color of urine, urgency or frequency, no hematuria  Skin: No rash, lesions, ulcerations MSK:  No joint pain or swelling.  No decreased range of motion.  Neuro: No dizziness or lightheadedness.  Psych: Mood stable.     Physical Exam:  BP (!) 144/82   Pulse (!) 125   Temp 99 F (37.2 C)   Ht _0  (1.549 m)   Wt 283 lb 3.2 oz (128.5 kg)   SpO2 90% Comment: 6L  BMI 53.51 kg/m   GEN: Pleasant, interactive, anxious; acutely-ill appearing; morbidly obese; in mild respiratory distress HEENT:  Normocephalic and atraumatic. PERRLA. Sclera white. Nasal turbinates pink, moist and patent bilaterally. No rhinorrhea present. Oropharynx pink and moist, without exudate or edema. No lesions, ulcerations, or postnasal drip.  NECK:  Supple w/ fair ROM. No JVD present.  CV: Tachycardia; regular rhythm, no m/r/g, dependent BLE edema. Pulses intact, +2 bilaterally. No cyanosis, pallor or clubbing. PULMONARY:  Increased work of breathing. Scattered rhonchi bilaterally A&P. Tripod position prior to O2 therapy; improved with supplemental O2.  GI: BS present and normoactive. Soft, non-tender to palpation. No organomegaly or masses detected.  MSK: No erythema, warmth or tenderness. No deformities or joint swelling noted.  Neuro: A/Ox3. Cognitive/developmental delays. Skin: Warm, no lesions or rashes    Lab Results:  CBC    Component Value Date/Time   WBC 12.0 (H)  04/24/2019 0241   RBC 5.60 (H) 04/24/2019 0241   HGB 13.0 04/24/2019 0241   HCT 45.4 04/24/2019 0241   PLT 194 04/24/2019 0241   MCV 81.1 04/24/2019 0241   MCH 23.2 (L) 04/24/2019 0241   MCHC 28.6 (L) 04/24/2019 0241   RDW 20.8 (H) 04/24/2019 0241   LYMPHSABS 1.4 04/21/2019 0610   MONOABS 0.5 04/21/2019 0610   EOSABS 0.0 04/21/2019 0610   BASOSABS 0.0 04/21/2019 0610    BMET    Component Value Date/Time   NA 140 04/21/2019 0610   K 4.1 04/21/2019 0610   CL 99 04/21/2019 0610   CO2 31 04/21/2019 0610   GLUCOSE 280 (H) 04/21/2019 0610   BUN <5 (L) 04/21/2019 0610   CREATININE 0.80 04/21/2019 0610   CALCIUM 8.6 (L) 04/21/2019 0610   GFRNONAA >60 04/21/2019 0610   GFRAA >60 04/21/2019 0610    BNP    Component Value Date/Time   BNP 47.0 04/21/2019 0610     Imaging:  DG Chest 2 View  Result Date: 03/07/2022 CLINICAL DATA:  Cough, short of breath, new onset hypoxia EXAM: CHEST - 2 VIEW COMPARISON:  11/06/2019 FINDINGS: Frontal and lateral views of the chest demonstrate stable enlargement the  cardiac silhouette. There is increased central vascular congestion with bilateral perihilar airspace disease. No effusion or pneumothorax. No acute bony abnormalities. IMPRESSION: 1. Constellation of findings suggesting mild congestive heart failure. Underlying infection would be difficult to exclude. Electronically Signed   By: Randa Ngo M.D.   On: 03/07/2022 14:44          No data to display          No results found for: "NITRICOXIDE"      Assessment & Plan:   Acute respiratory failure with hypoxia ([place]) Hypoxic to the 70's upon arrival to exam room. Rechecked on forehead probe with sats remaining in the 70's. Placed on nasal cannula; walking oximetry with desaturations to the low 80's on 6 lpm. Unable to stabilize saturations due to mouth breathing. Transitioned to NRB on 10lpm with sats in the 90's. She is tachypneic and tachycardic as well.   Pneumonia Bilateral  opacities on imaging, likely multifocal pna given infectious symptoms with superimposed pulmonary edema. She meets criteria for sepsis with tachycardia, tachypnea and ? fevers (99 F at OV after acetaminophen) as well as known infectious source. She also has known sick exposure. Unfortunately, she has associated hypoxia and difficulties maintaining saturations with activity, despite high levels of supplemental O2. She is not appropriate for outpatient therapy. She will need further workup/evaluation with hospital admission. EMS contacted for transport. Contacted [facility] ED to notify of impending admission; Spoke with Noah Delaine, Agricultural consultant. Dr Lamonte Sakai unavailable; Dr [facility] aware and agrees with above plan.   I spent 41 minutes of dedicated to the care of this patient on the date of this encounter to include pre-visit review of records, face-to-face time with the patient discussing conditions above, post visit ordering of testing, clinical documentation with the electronic health record, making appropriate referrals as documented, and communicating necessary findings to members of the patients care team.  Clayton Bibles, NP 03/07/2022  Pt aware and understands NP's role.

## 2022-03-08 ENCOUNTER — Telehealth: Payer: Self-pay | Admitting: Physician Assistant

## 2022-03-08 ENCOUNTER — Inpatient Hospital Stay (HOSPITAL_COMMUNITY): Payer: Medicare HMO

## 2022-03-08 DIAGNOSIS — J069 Acute upper respiratory infection, unspecified: Secondary | ICD-10-CM | POA: Diagnosis not present

## 2022-03-08 DIAGNOSIS — I5031 Acute diastolic (congestive) heart failure: Secondary | ICD-10-CM | POA: Diagnosis not present

## 2022-03-08 DIAGNOSIS — J9601 Acute respiratory failure with hypoxia: Secondary | ICD-10-CM | POA: Diagnosis not present

## 2022-03-08 LAB — ECHOCARDIOGRAM COMPLETE
Area-P 1/2: 3.19 cm2
Height: 61 in
S' Lateral: 3.1 cm
Weight: 4532.66 oz

## 2022-03-08 LAB — BASIC METABOLIC PANEL
Anion gap: 10 (ref 5–15)
BUN: 6 mg/dL (ref 6–20)
CO2: 34 mmol/L — ABNORMAL HIGH (ref 22–32)
Calcium: 8.5 mg/dL — ABNORMAL LOW (ref 8.9–10.3)
Chloride: 98 mmol/L (ref 98–111)
Creatinine, Ser: 0.82 mg/dL (ref 0.44–1.00)
GFR, Estimated: >60 mL/min (ref >60)
Glucose, Bld: 166 mg/dL — ABNORMAL HIGH (ref 70–99)
Potassium: 4.2 mmol/L (ref 3.5–5.1)
Sodium: 142 mmol/L (ref 135–145)

## 2022-03-08 LAB — GLUCOSE, CAPILLARY
Glucose-Capillary: 121 mg/dL — ABNORMAL HIGH (ref 70–99)
Glucose-Capillary: 118 mg/dL — ABNORMAL HIGH (ref 70–99)

## 2022-03-08 MED ORDER — IOHEXOL 350 MG/ML SOLN
70.0000 mL | Freq: Once | INTRAVENOUS | Status: AC | PRN
  Administered 2022-03-08: 70 mL via INTRAVENOUS

## 2022-03-08 MED ORDER — GUAIFENESIN ER 600 MG PO TB12
600.0000 mg | ORAL_TABLET | Freq: Two times a day (BID) | ORAL | Status: DC
  Administered 2022-03-08 – 2022-03-12 (×7): 600 mg via ORAL
  Filled 2022-03-08 (×8): qty 1

## 2022-03-08 MED ORDER — ARFORMOTEROL TARTRATE 15 MCG/2ML IN NEBU
15.0000 ug | INHALATION_SOLUTION | Freq: Two times a day (BID) | RESPIRATORY_TRACT | Status: DC
  Administered 2022-03-08 – 2022-03-12 (×7): 15 ug via RESPIRATORY_TRACT
  Filled 2022-03-08 (×8): qty 2

## 2022-03-08 MED ORDER — IPRATROPIUM-ALBUTEROL 0.5-2.5 (3) MG/3ML IN SOLN
3.0000 mL | Freq: Four times a day (QID) | RESPIRATORY_TRACT | Status: DC
  Administered 2022-03-08: 3 mL via RESPIRATORY_TRACT
  Filled 2022-03-08: qty 3

## 2022-03-08 MED ORDER — KETOROLAC TROMETHAMINE 15 MG/ML IJ SOLN
15.0000 mg | Freq: Three times a day (TID) | INTRAMUSCULAR | Status: DC | PRN
  Administered 2022-03-09 – 2022-03-11 (×3): 15 mg via INTRAVENOUS
  Filled 2022-03-08 (×4): qty 1

## 2022-03-08 MED ORDER — BUDESONIDE 0.5 MG/2ML IN SUSP
0.5000 mg | Freq: Two times a day (BID) | RESPIRATORY_TRACT | Status: DC
  Administered 2022-03-08 – 2022-03-12 (×7): 0.5 mg via RESPIRATORY_TRACT
  Filled 2022-03-08 (×8): qty 2

## 2022-03-08 MED ORDER — PERFLUTREN LIPID MICROSPHERE
1.0000 mL | INTRAVENOUS | Status: AC | PRN
  Administered 2022-03-08: 2 mL via INTRAVENOUS

## 2022-03-08 MED ORDER — FUROSEMIDE 10 MG/ML IJ SOLN
40.0000 mg | Freq: Once | INTRAMUSCULAR | Status: AC
  Administered 2022-03-08: 40 mg via INTRAVENOUS
  Filled 2022-03-08: qty 4

## 2022-03-08 NOTE — Progress Notes (Signed)
  Echocardiogram 2D Echocardiogram has been performed.  Regina Wood 03/08/2022, 9:22 AM

## 2022-03-08 NOTE — TOC Initial Note (Signed)
Transition of Care [facility]) - Initial/Assessment Note    Patient Details  Name: Regina Wood MRN: [id number] Date of Birth: 10-23-1980  [facility]) CM/SW Contact:    Verdell Carmine, RN Phone Number: 03/08/2022, 9:57 AM  Clinical Narrative:                 Met with patient, parents in room. Patient complains of a headache, just finished echo. She is independent at home, no DME needed. May need oxygen post hospitalization.   TOC will follow for any needs, recommendations, and transitions of Care.   Expected Discharge Plan: Home/Self Care Barriers to Discharge: Continued Medical Work up   Patient Goals and CMS Choice        Expected Discharge Plan and Services Expected Discharge Plan: Home/Self Care       Living arrangements for the past 2 months: Single Family Home                                      Prior Living Arrangements/Services Living arrangements for the past 2 months: [place]   Patient language and need for interpreter reviewed:: Yes        Need for Family Participation in Patient Care: Yes (Comment) Care giver support system in place?: Yes (comment)   Criminal Activity/Legal Involvement Pertinent to Current Situation/Hospitalization: No - Comment as needed  Activities of Daily Living      Permission Sought/Granted                  Emotional Assessment   Attitude/Demeanor/Rapport: Engaged, Self-Confident Affect (typically observed): Pleasant Orientation: : Oriented to Self, Oriented to Place, Oriented to Situation Alcohol / Substance Use: Not Applicable Psych Involvement: No (comment)  Admission diagnosis:  Acute hypoxemic respiratory failure ([place]) [J96.01] Patient Active Problem List   Diagnosis Date Noted   Acute hypoxemic respiratory failure ([place]) 03/07/2022   Abnormal CT of the chest 12/31/2019   Acute respiratory failure with hypoxia ([place]) 04/21/2019   Pneumonia 04/21/2019   Hyperglycemia  04/21/2019   Hypertensive urgency 04/21/2019   Developmental delay 04/21/2019   PCP:  Michael Boston, MD Pharmacy:   [id number]Phone: [phone]: [phone]    Social Determinants of Health (SDOH) Interventions    Readmission Risk Interventions     No data to display

## 2022-03-08 NOTE — Progress Notes (Addendum)
PROGRESS NOTE    Regina Wood  IPJ:[id number] DOB: Oct 30, 1980 DOA: 03/07/2022 PCP: Michael Boston, MD     Brief Narrative:  Regina Wood is a 41 y.o. female with medical history significant of developmental delay, morbid obesity, diabetes who was brought in by family secondary to shortness of breath and cough.  Patient was seen in pulmonology office 03/07/22 due to increasing SOB and cough for 2 weeks.  In the office, she was hypoxic to 70s required NRB.  X-ray showed bilateral opacities, concerning for multifocal pneumonia versus pulmonary edema. She was transferred to the hospital for further care.  New events last 24 hours / Subjective: Patient seen in the emergency department with dad at bedside.  Patient is a poor historian due to her history of developmental delay.  Dad states that patient has been having shortness of breath and productive cough of yellow sputum for about 2 weeks. Her main complaint this morning is a headache.   Assessment & Plan:   Principal Problem:   Acute hypoxemic respiratory failure (HCC) Active Problems:   Hypertensive urgency   Developmental delay   Acute hypoxemic respiratory failure -Desatted to the 81s in office.  On my examination in the ER, on 5 L nasal cannula O2.  Does not wear oxygen at baseline.  Continue to wean as able to maintain saturation >92% -Initial thought was CHF exacerbation.  However BNP is only 82, echocardiogram was unremarkable.  She does not appear fluid overloaded at this time.  Received one-time dose IV Lasix in the ER. -D-dimer negative -CT chest showing mosaic attenuation of the lungs -Question multifocal pneumonia versus other inflammatory disease.  Received vancomycin/cefepime in the ER. -Consult pulmonology today for further recommendation, discussed over the phone   Hypertension -Cozaar  Diabetes mellitus -Check A1c  -Farxiga    DVT prophylaxis: Lovenox   Code Status: Full code Family Communication: Dad  at bedside, Mom over the phone  Disposition Plan:  Status is: Inpatient Remains inpatient appropriate because: Pulm consult, requiring O2  Consultants:  Pulmonology   Procedures:  None   Antimicrobials:  Anti-infectives (From admission, onward)    Start     Dose/Rate Route Frequency Ordered Stop   03/08/22 1930  vancomycin (VANCOCIN) 1,750 mg in sodium chloride 0.9 % 500 mL IVPB  Status:  Discontinued        1,750 mg 258.8 mL/hr over 120 Minutes Intravenous Every 24 hours 03/07/22 1842 03/08/22 0754   03/07/22 1645  vancomycin (VANCOCIN) 2,000 mg in sodium chloride 0.9 % 500 mL IVPB        2,000 mg 260 mL/hr over 120 Minutes Intravenous  Once 03/07/22 1637 03/07/22 2018   03/07/22 1615  ceFEPIme (MAXIPIME) 1 g in sodium chloride 0.9 % 100 mL IVPB        1 g 200 mL/hr over 30 Minutes Intravenous  Once 03/07/22 1600 03/07/22 1745        Objective: Vitals:   03/08/22 0800 03/08/22 1008 03/08/22 1015 03/08/22 1115  BP: 107/83 (!) 143/114 121/78 (!) 130/103  Pulse:  (!) 104 100 62  Resp:  19  20  Temp:  (!) 97.3 F (36.3 C)    TempSrc:  Oral    SpO2:  96% 95% 93%  Weight:      Height:        Intake/Output Summary (Last 24 hours) at 03/08/2022 1340 Last data filed at 03/07/2022 2237 Gross per 24 hour  Intake --  Output 2000 ml  Net -2000 ml   Filed Weights   03/07/22 1731  Weight: 128.5 kg    Examination:  General exam: Appears calm and comfortable  Respiratory system: Clear to auscultation. Respiratory effort normal. No respiratory distress. No conversational dyspnea. On 5L O2  Cardiovascular system: S1 & S2 heard, RRR. No murmurs. No pedal edema. Gastrointestinal system: Abdomen is nondistended, soft and nontender. Normal bowel sounds heard. Central nervous system: Alert. No focal neurological deficits. Speech clear.  Extremities: Symmetric in appearance  Skin: No rashes, lesions or ulcers on exposed skin   Data Reviewed: I have personally reviewed following  labs and imaging studies  CBC: Recent Labs  Lab 03/07/22 1700 03/07/22 1709 03/07/22 2100  WBC 12.3*  --  13.3*  NEUTROABS 8.9*  --   --   HGB 12.8 13.6 12.7  HCT 44.2 40.0 43.0  MCV 77.1*  --  77.3*  PLT 233  --  214   Basic Metabolic Panel: Recent Labs  Lab 03/07/22 1700 03/07/22 1709 03/07/22 2100 03/08/22 0554  NA 143 137  --  142  K 4.2 4.2  --  4.2  CL 103  --   --  98  CO2 30  --   --  34*  GLUCOSE 148*  --   --  166*  BUN 7  --   --  6  CREATININE 0.85  --  0.84 0.82  CALCIUM 9.1  --   --  8.5*   GFR: Estimated Creatinine Clearance: 115.3 mL/min (by C-G formula based on SCr of 0.82 mg/dL). Liver Function Tests: Recent Labs  Lab 03/07/22 1700  AST 30  ALT 58*  ALKPHOS 86  BILITOT 2.2*  PROT 6.6  ALBUMIN 3.1*   No results for input(s): "LIPASE", "AMYLASE" in the last 168 hours. No results for input(s): "AMMONIA" in the last 168 hours. Coagulation Profile: No results for input(s): "INR", "PROTIME" in the last 168 hours. Cardiac Enzymes: No results for input(s): "CKTOTAL", "CKMB", "CKMBINDEX", "TROPONINI" in the last 168 hours. BNP (last 3 results) No results for input(s): "PROBNP" in the last 8760 hours. HbA1C: No results for input(s): "HGBA1C" in the last 72 hours. CBG: No results for input(s): "GLUCAP" in the last 168 hours. Lipid Profile: No results for input(s): "CHOL", "HDL", "LDLCALC", "TRIG", "CHOLHDL", "LDLDIRECT" in the last 72 hours. Thyroid Function Tests: No results for input(s): "TSH", "T4TOTAL", "FREET4", "T3FREE", "THYROIDAB" in the last 72 hours. Anemia Panel: No results for input(s): "VITAMINB12", "FOLATE", "FERRITIN", "TIBC", "IRON", "RETICCTPCT" in the last 72 hours. Sepsis Labs: Recent Labs  Lab 03/07/22 1700  LATICACIDVEN 1.3    Recent Results (from the past 240 hour(s))  Blood culture (routine x 2)     Status: None (Preliminary result)   Collection Time: 03/07/22  3:54 PM   Specimen: BLOOD  Result Value Ref Range  Status   Specimen Description BLOOD LEFT ANTECUBITAL  Final   Special Requests   Final    BOTTLES DRAWN AEROBIC AND ANAEROBIC Blood Culture adequate volume   Culture   Final    NO GROWTH < 24 HOURS Performed at [facility], [address] [place], [place] [address]    Report Status PENDING  Incomplete  SARS Coronavirus 2 by RT PCR (hospital order, performed in [facility] hospital lab) *cepheid single result test* Anterior Nasal Swab     Status: None   Collection Time: 03/07/22  3:54 PM   Specimen: Anterior Nasal Swab  Result Value Ref Range Status  SARS Coronavirus 2 by RT PCR NEGATIVE NEGATIVE Final    Comment: (NOTE) SARS-CoV-2 target nucleic acids are NOT DETECTED.  The SARS-CoV-2 RNA is generally detectable in upper and lower respiratory specimens during the acute phase of infection. The lowest concentration of SARS-CoV-2 viral copies this assay can detect is 250 copies / mL. A negative result does not preclude SARS-CoV-2 infection and should not be used as the sole basis for treatment or other patient management decisions.  A negative result may occur with improper specimen collection / handling, submission of specimen other than nasopharyngeal swab, presence of viral mutation(s) within the areas targeted by this assay, and inadequate number of viral copies (<250 copies / mL). A negative result must be combined with clinical observations, patient history, and epidemiological information.  Fact Sheet for Patients:   RoadLapTop.co.zahttps://www.fda.gov/media/158405/download  Fact Sheet for Healthcare Providers: http://kim-miller.com/https://www.fda.gov/media/158404/download  This test is not yet approved or  cleared by the [place] FDA and has been authorized for detection and/or diagnosis of SARS-CoV-2 by FDA under an Emergency Use Authorization (EUA).  This EUA will remain in effect (meaning this test can be used) for the duration of the COVID-19 declaration under Section 564(b)(1) of the Act,  21 U.S.C. section 360bbb-3(b)(1), unless the authorization is terminated or revoked sooner.  Performed at [facility], [address] [place], [place] [address]   Blood culture (routine x 2)     Status: None (Preliminary result)   Collection Time: 03/07/22  5:00 PM   Specimen: BLOOD  Result Value Ref Range Status   Specimen Description BLOOD RIGHT ANTECUBITAL  Final   Special Requests   Final    BOTTLES DRAWN AEROBIC AND ANAEROBIC Blood Culture adequate volume   Culture  Setup Time   Final    NO ORGANISMS SEEN IN BOTH AEROBIC AND ANAEROBIC BOTTLES    Culture   Final    NO GROWTH < 24 HOURS Performed at [facility], [address] [place], [place] [address]    Report Status PENDING  Incomplete  MRSA Next Gen by PCR, Nasal     Status: None   Collection Time: 03/07/22  6:39 PM   Specimen: Nasal Mucosa; Nasal Swab  Result Value Ref Range Status   MRSA by PCR Next Gen NOT DETECTED NOT DETECTED Final    Comment: (NOTE) The GeneXpert MRSA Assay (FDA approved for NASAL specimens only), is one component of a comprehensive MRSA colonization surveillance program. It is not intended to diagnose MRSA infection nor to guide or monitor treatment for MRSA infections. Test performance is not FDA approved in patients less than 40 years old. Performed at [facility], [address] [place], [place] [address]       Radiology Studies: CT CHEST W CONTRAST  Result Date: 03/08/2022 CLINICAL DATA:  Respiratory illness, nondiagnostic xray EXAM: CT CHEST WITH CONTRAST TECHNIQUE: Multidetector CT imaging of the chest was performed during intravenous contrast administration. RADIATION DOSE REDUCTION: This exam was performed according to the departmental dose-optimization program which includes automated exposure control, adjustment of the mA and/or kV according to patient size and/or use of iterative reconstruction technique. CONTRAST:  70mL OMNIPAQUE IOHEXOL 350 MG/ML SOLN  COMPARISON:  Chest radiograph 03/07/2022 FINDINGS: There is prominent respiratory motion artifact which degrades image quality. Cardiovascular: Mild cardiomegaly.No pericardial disease.Normal size main and branch pulmonary arteries.The thoracic aorta is unremarkable. Mediastinum/Nodes: There are few prominent mediastinal lymph nodes, favored to be reactive.The thyroid is unremarkable.Esophagus is unremarkable.The trachea  is unremarkable. Lungs/Pleura: Mosaic attenuation of the lungs. There are band like opacities bilaterally likely scarring and/or atelectasis. No pleural effusion or pneumothorax. Upper Abdomen: Hepatic steatosis.  No acute findings. Musculoskeletal: Scoliosis. No acute osseous abnormality.No suspicious osseous lesion. IMPRESSION: Mild cardiomegaly with mosaic attenuation of the lungs, appearance suggesting pulmonary edema and/or small airways disease. Electronically Signed   By: Caprice Renshaw M.D.   On: 03/08/2022 11:37   ECHOCARDIOGRAM COMPLETE  Result Date: 03/08/2022    ECHOCARDIOGRAM REPORT   Patient Name:   Regina Wood Date of Exam: 03/08/2022 Medical Rec #:  [id number]        Height:       61.0 in Accession #:    [id number]       Weight:       283.3 lb Date of Birth:  1981/03/23        BSA:          2.190 m Patient Age:    41 years         BP:           107/83 mmHg Patient Gender: F                HR:           103 bpm. Exam Location:  Inpatient Procedure: 2D Echo, Cardiac Doppler, Color Doppler and Intracardiac            Opacification Agent Indications:    CHF acute diastolic  History:        Patient has no prior history of Echocardiogram examinations.                 Signs/Symptoms:Shortness of Breath; Risk Factors:Diabetes,                 Hypertension and Obesity.  Sonographer:    Milda Smart Referring Phys: [id number] MOHAMMAD L GARBA  Sonographer Comments: Technically difficult study due to poor echo windows and patient is obese. Image acquisition challenging due to patient body  habitus and Image acquisition challenging due to respiratory motion. IMPRESSIONS  1. Left ventricular ejection fraction, by estimation, is 60 to 65%. The left ventricle has normal function. The left ventricle has no regional wall motion abnormalities. Left ventricular diastolic parameters were normal.  2. Right ventricular systolic function is normal. The right ventricular size is normal.  3. The mitral valve is normal in structure. No evidence of mitral valve regurgitation. No evidence of mitral stenosis.  4. The aortic valve has an indeterminant number of cusps. Aortic valve regurgitation is not visualized. No aortic stenosis is present. Comparison(s): No prior Echocardiogram. FINDINGS  Left Ventricle: Left ventricular ejection fraction, by estimation, is 60 to 65%. The left ventricle has normal function. The left ventricle has no regional wall motion abnormalities. Definity contrast agent was given IV to delineate the left ventricular  endocardial borders. The left ventricular internal cavity size was normal in size. There is no left ventricular hypertrophy. Left ventricular diastolic parameters were normal. Right Ventricle: The right ventricular size is normal. Right ventricular systolic function is normal. Left Atrium: Left atrial size was normal in size. Right Atrium: Right atrial size was normal in size. Pericardium: There is no evidence of pericardial effusion. Mitral Valve: The mitral valve is normal in structure. No evidence of mitral valve regurgitation. No evidence of mitral valve stenosis. Tricuspid Valve: The tricuspid valve is normal in structure. Tricuspid valve regurgitation is trivial. No evidence of tricuspid stenosis. Aortic Valve: The aortic  valve has an indeterminant number of cusps. Aortic valve regurgitation is not visualized. No aortic stenosis is present. Pulmonic Valve: The pulmonic valve was normal in structure. Pulmonic valve regurgitation is not visualized. No evidence of pulmonic  stenosis. Aorta: The aortic root is normal in size and structure. Venous: The inferior vena cava was not well visualized. IAS/Shunts: The interatrial septum was not well visualized.  LEFT VENTRICLE PLAX 2D LVIDd:         4.30 cm   Diastology LVIDs:         3.10 cm   LV e' medial:    11.10 cm/s LV PW:         1.00 cm   LV E/e' medial:  9.5 LV IVS:        0.70 cm   LV e' lateral:   17.40 cm/s LVOT diam:     1.70 cm   LV E/e' lateral: 6.0 LV SV:         56 LV SV Index:   25 LVOT Area:     2.27 cm  RIGHT VENTRICLE RV S prime:     16.40 cm/s TAPSE (M-mode): 2.2 cm LEFT ATRIUM             Index        RIGHT ATRIUM           Index LA diam:        3.30 cm 1.51 cm/m   RA Area:     11.20 cm LA Vol (A2C):   37.9 ml 17.31 ml/m  RA Volume:   23.20 ml  10.59 ml/m LA Vol (A4C):   37.5 ml 17.12 ml/m LA Biplane Vol: 38.2 ml 17.44 ml/m  AORTIC VALVE LVOT Vmax:   145.00 cm/s LVOT Vmean:  93.600 cm/s LVOT VTI:    0.245 m  AORTA Ao Root diam: 2.60 cm Ao Asc diam:  3.00 cm MITRAL VALVE MV Area (PHT): 3.19 cm     SHUNTS MV Decel Time: 238 msec     Systemic VTI:  0.24 m MV E velocity: 105.00 cm/s  Systemic Diam: 1.70 cm MV A velocity: 67.90 cm/s MV E/A ratio:  1.55 Olga Millers MD Electronically signed by Olga Millers MD Signature Date/Time: 03/08/2022/9:24:33 AM    Final    DG Chest 2 View  Result Date: 03/07/2022 CLINICAL DATA:  Cough, short of breath, new onset hypoxia EXAM: CHEST - 2 VIEW COMPARISON:  11/06/2019 FINDINGS: Frontal and lateral views of the chest demonstrate stable enlargement the cardiac silhouette. There is increased central vascular congestion with bilateral perihilar airspace disease. No effusion or pneumothorax. No acute bony abnormalities. IMPRESSION: 1. Constellation of findings suggesting mild congestive heart failure. Underlying infection would be difficult to exclude. Electronically Signed   By: Sharlet Salina M.D.   On: 03/07/2022 14:44      Scheduled Meds:  enoxaparin (LOVENOX) injection   0.5 mg/kg Subcutaneous Q24H   losartan  100 mg Oral Daily   pregabalin  200 mg Oral BID   sodium chloride flush  3 mL Intravenous Q12H   trimethoprim-polymyxin b  1 drop Both Eyes QID   Continuous Infusions:  sodium chloride       LOS: 1 day     Noralee Stain, DO [facility] 03/08/2022, 1:40 PM   Available via Epic secure chat 7am-7pm After these hours, please refer to coverage provider listed on [email or web address]

## 2022-03-08 NOTE — Consult Note (Addendum)
NAME:  Regina Wood, MRN:  [id number], DOB:  06-26-1980, LOS: 1 ADMISSION DATE:  03/07/2022, CONSULTATION DATE:  03/08/22 REFERRING MD:  Maylene Roes, CHIEF COMPLAINT:   hypoxia  History of Present Illness:  Regina Wood is a 41 y.o. F with PMH of developmental delay, morbid obesity, DM, who had ground glass opacities and pneumonia  on CT chest in 2020.  Repeat CT in 2021 showed interval clearing of pneumonia with mild residual GGO's.   She presented to [facility] pulmonary office yesterday and was hypoxic to the 70%'s with two weeks of cough and with headaches, reported decreased po intake and was sent to the ED.  She has had a niece with similar symptoms.   In the ED, sats improved with 5L [place], CXR concerning for pulmonary edema with bilateral airspace disease.  Labs significant for WBC 13k, normal lactic acid, Covid negative.  She was started on Cefepime and Vancomycin; Tmax 56F.  BNP was not significantly elevated and EF 60-65%.    CT chest suggestive of pulmonary edema vs small airway disease and pt remained on [place] oxygen.  Dad reports that she went home on [place] O2 after her last PNA two years ago, but was able to come off after a few weeks.  No hx of tobacco use or second hand smoke exposure, no new pets or allergies.  Pertinent  Medical History   has a past medical history of Diabetes mellitus without complication ([place]), Obesity, and Pneumonia.   Significant Hospital Events: Including procedures, antibiotic start and stop dates in addition to other pertinent events   11/2 presented to [facility] pulm office with hypoxia, sent to ED, admitted to [facility] 11/3 PCCM consult  Interim History / Subjective:  Pt and Dad feel like she is feeling a little better, currently on 4L   Objective   Blood pressure (!) 130/103, pulse 62, temperature (!) 97.3 F (36.3 C), temperature source Oral, resp. rate 20, height _0  (1.549 m), weight 128.5 kg, SpO2 93 %.        Intake/Output Summary (Last 24 hours) at 03/08/2022  1401 Last data filed at 03/07/2022 2237 Gross per 24 hour  Intake --  Output 2000 ml  Net -2000 ml   Filed Weights   03/07/22 1731  Weight: 128.5 kg    General:  overweight F, resting in bed in no distress HEENT: MM pink/moist, sclera anicteric Neuro: awake, alert, follows commands, development delay-at mental status baseline per family, speech clear  CV: s1s2 rrr, no m/r/g PULM:  no distress on 4L, scattered rhonchi  GI: soft, obese, non-tender to palpation Extremities: warm/dry, no edema  Skin: no rashes or lesions  Resolved Hospital Problem list     Assessment & Plan:     Acute Hypoxic Respiratory Failure  Likely secondary to viral URI CT chest with mosaic attenuation, no lobar infiltrate, echo not suggestive of PAH, BNP WNL and EF preserved, consider chronic air trapping  HIV negative, not at risk for PJP Covid negative -send RVP -de-escalate antibiotics to CAP coverage with Ceftriaxone and Azithromycin -continue supportive care -follow up with [facility] post discharge, will request appointment through [place] of plan per primary team  Best Practice (right click and "Reselect all SmartList Selections" daily)   Per primary  Labs   CBC: Recent Labs  Lab 03/07/22 1700 03/07/22 1709 03/07/22 2100  WBC 12.3*  --  13.3*  NEUTROABS 8.9*  --   --   HGB 12.8 13.6 12.7  HCT  44.2 40.0 43.0  MCV 77.1*  --  77.3*  PLT 233  --  202    Basic Metabolic Panel: Recent Labs  Lab 03/07/22 1700 03/07/22 1709 03/07/22 2100 03/08/22 0554  NA 143 137  --  142  K 4.2 4.2  --  4.2  CL 103  --   --  98  CO2 30  --   --  34*  GLUCOSE 148*  --   --  166*  BUN 7  --   --  6  CREATININE 0.85  --  0.84 0.82  CALCIUM 9.1  --   --  8.5*   GFR: Estimated Creatinine Clearance: 115.3 mL/min (by C-G formula based on SCr of 0.82 mg/dL). Recent Labs  Lab 03/07/22 1700 03/07/22 2100  WBC 12.3* 13.3*  LATICACIDVEN 1.3  --     Liver Function Tests: Recent Labs   Lab 03/07/22 1700  AST 30  ALT 58*  ALKPHOS 86  BILITOT 2.2*  PROT 6.6  ALBUMIN 3.1*   No results for input(s): "LIPASE", "AMYLASE" in the last 168 hours. No results for input(s): "AMMONIA" in the last 168 hours.  ABG    Component Value Date/Time   PHART 7.456 (H) 03/07/2022 1709   PCO2ART 43.9 03/07/2022 1709   PO2ART 83 03/07/2022 1709   HCO3 31.0 (H) 03/07/2022 1709   TCO2 32 03/07/2022 1709   O2SAT 97 03/07/2022 1709     Coagulation Profile: No results for input(s): "INR", "PROTIME" in the last 168 hours.  Cardiac Enzymes: No results for input(s): "CKTOTAL", "CKMB", "CKMBINDEX", "TROPONINI" in the last 168 hours.  HbA1C: Hgb A1c MFr Bld  Date/Time Value Ref Range Status  04/21/2019 06:10 AM 8.7 (H) 4.8 - 5.6 % Final    Comment:    (NOTE) Pre diabetes:          5.7%-6.4% Diabetes:              >6.4% Glycemic control for   <7.0% adults with diabetes     CBG: No results for input(s): "GLUCAP" in the last 168 hours.  Review of Systems:   Please see the history of present illness. All other systems reviewed and are negative    Past Medical History:  She,  has a past medical history of Diabetes mellitus without complication ([place]), Obesity, and Pneumonia.   Surgical History:  History reviewed. No pertinent surgical history.   Social History:   reports that she has never smoked. She has never used smokeless tobacco. She reports current alcohol use. She reports that she does not use drugs.   Family History:  Her family history includes Diabetes in her father; Healthy in her mother; Hypertension in her father; Migraines in her mother. There is no history of Neuropathy.   Allergies No Known Allergies   Home Medications  Prior to Admission medications   Medication Sig Start Date End Date Taking? Authorizing Provider  acetaminophen (TYLENOL) 500 MG tablet Take 1,000 mg by mouth every 6 (six) hours as needed for moderate pain.   Yes [provider]   Cyanocobalamin (VITAMIN B 12 PO) Take 1 tablet by mouth daily.   Yes [provider]  dapagliflozin propanediol (FARXIGA) 5 MG TABS tablet Take 1 tablet by mouth daily. 06/11/21  Yes [provider]  losartan (COZAAR) 100 MG tablet Take 100 mg by mouth daily. 10/05/21  Yes [provider]  metFORMIN (GLUCOPHAGE) 500 MG tablet Take 1 tablet (500 mg total) by mouth 2 (two)  times daily with a meal. 04/24/19 03/07/22 Yes Dessa Phi, DO  pregabalin (LYRICA) 200 MG capsule Take 1 capsule (200 mg total) by mouth 2 (two) times daily. 11/26/21  Yes Melvenia Beam, MD  trimethoprim-polymyxin b (POLYTRIM) ophthalmic solution Place 1 drop into both eyes 4 (four) times daily. 03/05/22  Yes [provider]  blood glucose meter kit and supplies Dispense based on patient and insurance preference. Use up to four times daily as directed. (FOR ICD-10 E10.9, E11.9). 04/24/19   Dessa Phi, DO     Critical care time: n/a     Otilio Carpen Alizae Bechtel, PA-C [facility] See Amion for pager If no response to pager , please call [phone] until 7pm After 7:00 pm call Elink  [phone]?[phone]

## 2022-03-08 NOTE — Progress Notes (Signed)
Heart Failure Navigator Progress Note  Assessed for Heart & Vascular TOC clinic readiness.  Patient does not meet criteria due to not acute CHF Echo on 03/08/2022 showed EF 60-65%.     Regina Wood, BSN, Clinical cytogeneticist Only

## 2022-03-09 DIAGNOSIS — J9601 Acute respiratory failure with hypoxia: Secondary | ICD-10-CM | POA: Diagnosis not present

## 2022-03-09 LAB — GLUCOSE, CAPILLARY
Glucose-Capillary: 161 mg/dL — ABNORMAL HIGH (ref 70–99)
Glucose-Capillary: 150 mg/dL — ABNORMAL HIGH (ref 70–99)

## 2022-03-09 LAB — CBC
HCT: 42.2 % (ref 36.0–46.0)
Hemoglobin: 12.2 g/dL (ref 12.0–15.0)
MCH: 22.5 pg — ABNORMAL LOW (ref 26.0–34.0)
MCHC: 28.9 g/dL — ABNORMAL LOW (ref 30.0–36.0)
MCV: 77.9 fL — ABNORMAL LOW (ref 80.0–100.0)
Platelets: 202 10*3/uL (ref 150–400)
RBC: 5.42 MIL/uL — ABNORMAL HIGH (ref 3.87–5.11)
RDW: 20.5 % — ABNORMAL HIGH (ref 11.5–15.5)
WBC: 10.8 10*3/uL — ABNORMAL HIGH (ref 4.0–10.5)
nRBC: 0.2 % (ref 0.0–0.2)

## 2022-03-09 LAB — RESPIRATORY PANEL BY PCR

## 2022-03-09 LAB — HEMOGLOBIN A1C
Hgb A1c MFr Bld: 7.6 % — ABNORMAL HIGH (ref 4.8–5.6)
Mean Plasma Glucose: 171.42 mg/dL

## 2022-03-09 LAB — BASIC METABOLIC PANEL
Anion gap: 15 (ref 5–15)
BUN: 6 mg/dL (ref 6–20)
CO2: 35 mmol/L — ABNORMAL HIGH (ref 22–32)
Calcium: 8.9 mg/dL (ref 8.9–10.3)
Chloride: 94 mmol/L — ABNORMAL LOW (ref 98–111)
Creatinine, Ser: 0.74 mg/dL (ref 0.44–1.00)
GFR, Estimated: >60 mL/min (ref >60)
Glucose, Bld: 194 mg/dL — ABNORMAL HIGH (ref 70–99)
Potassium: 3.8 mmol/L (ref 3.5–5.1)
Sodium: 144 mmol/L (ref 135–145)

## 2022-03-09 MED ORDER — IPRATROPIUM-ALBUTEROL 0.5-2.5 (3) MG/3ML IN SOLN
3.0000 mL | Freq: Three times a day (TID) | RESPIRATORY_TRACT | Status: DC
  Administered 2022-03-09 – 2022-03-10 (×4): 3 mL via RESPIRATORY_TRACT
  Filled 2022-03-09 (×4): qty 3

## 2022-03-09 MED ORDER — AZITHROMYCIN 250 MG PO TABS
500.0000 mg | ORAL_TABLET | Freq: Every day | ORAL | Status: DC
  Administered 2022-03-09: 500 mg via ORAL
  Filled 2022-03-09: qty 2

## 2022-03-09 NOTE — Progress Notes (Signed)
PROGRESS NOTE    Regina Wood  [id number] DOB: 09-30-1980 DOA: 03/07/2022 PCP: Michael Boston, MD     Brief Narrative:  Regina Wood is a 41 y.o. female with medical history significant of developmental delay, morbid obesity, diabetes who was brought in by family secondary to shortness of breath and cough.  Patient was seen in pulmonology office 03/07/22 due to increasing SOB and cough for 2 weeks.  In the office, she was hypoxic to 70s required NRB.  X-ray showed bilateral opacities, concerning for multifocal pneumonia versus pulmonary edema. She was transferred to the hospital for further care.  New events last 24 hours / Subjective: At baseline mentation with her developmental delay.  Complains of headache today.  Has some anxiety.  Mom is at bedside  Assessment & Plan:   Principal Problem:   Acute hypoxemic respiratory failure (HCC) Active Problems:   HTN (hypertension)   Developmental delay   Upper respiratory infection, acute   Acute hypoxemic respiratory failure -Desatted to the 75s in office.  On my examination in the ER, on 5 L nasal cannula O2.  Does not wear oxygen at baseline.  Continue to wean as able to maintain saturation >92% -Initial thought was CHF exacerbation.  However BNP is only 82, echocardiogram was unremarkable.  She does not appear fluid overloaded at this time.  Received one-time dose IV Lasix in the ER. -D-dimer negative -CT chest showing mosaic attenuation of the lungs -Question multifocal pneumonia versus other inflammatory disease.  Received vancomycin/cefepime in the ER. -Appreciate pulmonology. Thought to be viral lower respiratory tract infection with air trapping. Recommend respiratory viral panel, trial of atypical coverage, bronchodilators, mucinex, flutter valve.   Hypertension -Cozaar  Diabetes mellitus -Check A1c  -Farxiga    DVT prophylaxis: Lovenox   Code Status: Full code Family Communication: Mom at bedside   Disposition Plan:  Status is: Inpatient Remains inpatient appropriate because: Requiring O2   Consultants:  Pulmonology   Procedures:  None   Antimicrobials:  Anti-infectives (From admission, onward)    Start     Dose/Rate Route Frequency Ordered Stop   03/09/22 1000  azithromycin (ZITHROMAX) tablet 500 mg        500 mg Oral Daily 03/09/22 0731 03/12/22 0959   03/08/22 1930  vancomycin (VANCOCIN) 1,750 mg in sodium chloride 0.9 % 500 mL IVPB  Status:  Discontinued        1,750 mg 258.8 mL/hr over 120 Minutes Intravenous Every 24 hours 03/07/22 1842 03/08/22 0754   03/07/22 1645  vancomycin (VANCOCIN) 2,000 mg in sodium chloride 0.9 % 500 mL IVPB        2,000 mg 260 mL/hr over 120 Minutes Intravenous  Once 03/07/22 1637 03/07/22 2018   03/07/22 1615  ceFEPIme (MAXIPIME) 1 g in sodium chloride 0.9 % 100 mL IVPB        1 g 200 mL/hr over 30 Minutes Intravenous  Once 03/07/22 1600 03/07/22 1745        Objective: Vitals:   03/08/22 2143 03/09/22 0107 03/09/22 0641 03/09/22 0814  BP: (!) 127/104 121/73 116/61   Pulse: 97 97 94   Resp: 15 20 13    Temp: 98.6 F (37 C) 98.6 F (37 C) (!) 97.5 F (36.4 C)   TempSrc: Oral Oral Oral   SpO2: 96% 96%  96%  Weight:   123 kg   Height:        Intake/Output Summary (Last 24 hours) at 03/09/2022 1119 Last data filed at  03/09/2022 0124 Gross per 24 hour  Intake --  Output 1150 ml  Net -1150 ml    Filed Weights   03/07/22 1731 03/09/22 0641  Weight: 128.5 kg 123 kg    Examination:  General exam: Appears calm and comfortable  Respiratory system: Clear to auscultation. Respiratory effort normal. No respiratory distress. No conversational dyspnea. On 5L O2  Cardiovascular system: S1 & S2 heard, RRR. No murmurs. No pedal edema. Gastrointestinal system: Abdomen is nondistended, soft and nontender. Normal bowel sounds heard. Central nervous system: Alert. No focal neurological deficits. Speech clear.  Extremities: Symmetric in  appearance  Skin: No rashes, lesions or ulcers on exposed skin   Data Reviewed: I have personally reviewed following labs and imaging studies  CBC: Recent Labs  Lab 03/07/22 1700 03/07/22 1709 03/07/22 2100 03/09/22 0046  WBC 12.3*  --  13.3* 10.8*  NEUTROABS 8.9*  --   --   --   HGB 12.8 13.6 12.7 12.2  HCT 44.2 40.0 43.0 42.2  MCV 77.1*  --  77.3* 77.9*  PLT 233  --  214 324    Basic Metabolic Panel: Recent Labs  Lab 03/07/22 1700 03/07/22 1709 03/07/22 2100 03/08/22 0554 03/09/22 0046  NA 143 137  --  142 144  K 4.2 4.2  --  4.2 3.8  CL 103  --   --  98 94*  CO2 30  --   --  34* 35*  GLUCOSE 148*  --   --  166* 194*  BUN 7  --   --  6 6  CREATININE 0.85  --  0.84 0.82 0.74  CALCIUM 9.1  --   --  8.5* 8.9    GFR: Estimated Creatinine Clearance: 115 mL/min (by C-G formula based on SCr of 0.74 mg/dL). Liver Function Tests: Recent Labs  Lab 03/07/22 1700  AST 30  ALT 58*  ALKPHOS 86  BILITOT 2.2*  PROT 6.6  ALBUMIN 3.1*    No results for input(s): "LIPASE", "AMYLASE" in the last 168 hours. No results for input(s): "AMMONIA" in the last 168 hours. Coagulation Profile: No results for input(s): "INR", "PROTIME" in the last 168 hours. Cardiac Enzymes: No results for input(s): "CKTOTAL", "CKMB", "CKMBINDEX", "TROPONINI" in the last 168 hours. BNP (last 3 results) No results for input(s): "PROBNP" in the last 8760 hours. HbA1C: Recent Labs    03/09/22 0046  HGBA1C 7.6*   CBG: Recent Labs  Lab 03/08/22 1640 03/08/22 2135 03/09/22 0635  GLUCAP 121* 118* 161*   Lipid Profile: No results for input(s): "CHOL", "HDL", "LDLCALC", "TRIG", "CHOLHDL", "LDLDIRECT" in the last 72 hours. Thyroid Function Tests: No results for input(s): "TSH", "T4TOTAL", "FREET4", "T3FREE", "THYROIDAB" in the last 72 hours. Anemia Panel: No results for input(s): "VITAMINB12", "FOLATE", "FERRITIN", "TIBC", "IRON", "RETICCTPCT" in the last 72 hours. Sepsis Labs: Recent Labs   Lab 03/07/22 1700  LATICACIDVEN 1.3     Recent Results (from the past 240 hour(s))  Blood culture (routine x 2)     Status: None (Preliminary result)   Collection Time: 03/07/22  3:54 PM   Specimen: BLOOD  Result Value Ref Range Status   Specimen Description BLOOD LEFT ANTECUBITAL  Final   Special Requests   Final    BOTTLES DRAWN AEROBIC AND ANAEROBIC Blood Culture adequate volume   Culture   Final    NO GROWTH 2 DAYS Performed at [facility], [address], [place], [place] [address]    Report Status PENDING  Incomplete  SARS Coronavirus 2 by RT PCR (hospital order, performed in [facility] hospital lab) *cepheid single result test* Anterior Nasal Swab     Status: None   Collection Time: 03/07/22  3:54 PM   Specimen: Anterior Nasal Swab  Result Value Ref Range Status   SARS Coronavirus 2 by RT PCR NEGATIVE NEGATIVE Final    Comment: (NOTE) SARS-CoV-2 target nucleic acids are NOT DETECTED.  The SARS-CoV-2 RNA is generally detectable in upper and lower respiratory specimens during the acute phase of infection. The lowest concentration of SARS-CoV-2 viral copies this assay can detect is 250 copies / mL. A negative result does not preclude SARS-CoV-2 infection and should not be used as the sole basis for treatment or other patient management decisions.  A negative result may occur with improper specimen collection / handling, submission of specimen other than nasopharyngeal swab, presence of viral mutation(s) within the areas targeted by this assay, and inadequate number of viral copies (<250 copies / mL). A negative result must be combined with clinical observations, patient history, and epidemiological information.  Fact Sheet for Patients:   https://www.patel.info/  Fact Sheet for Healthcare Providers: https://hall.com/  This test is not yet approved or  cleared by the [place] FDA and has been authorized for  detection and/or diagnosis of SARS-CoV-2 by FDA under an Emergency Use Authorization (EUA).  This EUA will remain in effect (meaning this test can be used) for the duration of the COVID-19 declaration under Section 564(b)(1) of the Act, 21 U.S.C. section 360bbb-3(b)(1), unless the authorization is terminated or revoked sooner.  Performed at [facility], [place] [address], [place], [place] [address]   Blood culture (routine x 2)     Status: None (Preliminary result)   Collection Time: 03/07/22  5:00 PM   Specimen: BLOOD  Result Value Ref Range Status   Specimen Description BLOOD RIGHT ANTECUBITAL  Final   Special Requests   Final    BOTTLES DRAWN AEROBIC AND ANAEROBIC Blood Culture adequate volume   Culture  Setup Time   Final    NO ORGANISMS SEEN IN BOTH AEROBIC AND ANAEROBIC BOTTLES    Culture   Final    NO GROWTH 2 DAYS Performed at [facility], [place] [address] [place], [place] [address]    Report Status PENDING  Incomplete  MRSA Next Gen by PCR, Nasal     Status: None   Collection Time: 03/07/22  6:39 PM   Specimen: Nasal Mucosa; Nasal Swab  Result Value Ref Range Status   MRSA by PCR Next Gen NOT DETECTED NOT DETECTED Final    Comment: (NOTE) The GeneXpert MRSA Assay (FDA approved for NASAL specimens only), is one component of a comprehensive MRSA colonization surveillance program. It is not intended to diagnose MRSA infection nor to guide or monitor treatment for MRSA infections. Test performance is not FDA approved in patients less than 11 years old. Performed at [facility], [place] [address], [place], [place] [address]       Radiology Studies: CT CHEST W CONTRAST  Result Date: 03/08/2022 CLINICAL DATA:  Respiratory illness, nondiagnostic xray EXAM: CT CHEST WITH CONTRAST TECHNIQUE: Multidetector CT imaging of the chest was performed during intravenous contrast administration. RADIATION DOSE REDUCTION: This exam was performed according to the  departmental dose-optimization program which includes automated exposure control, adjustment of the mA and/or kV according to patient size and/or use of iterative reconstruction technique. CONTRAST:  4mL OMNIPAQUE IOHEXOL 350 MG/ML  SOLN COMPARISON:  Chest radiograph 03/07/2022 FINDINGS: There is prominent respiratory motion artifact which degrades image quality. Cardiovascular: Mild cardiomegaly.No pericardial disease.Normal size main and branch pulmonary arteries.The thoracic aorta is unremarkable. Mediastinum/Nodes: There are few prominent mediastinal lymph nodes, favored to be reactive.The thyroid is unremarkable.Esophagus is unremarkable.The trachea is unremarkable. Lungs/Pleura: Mosaic attenuation of the lungs. There are band like opacities bilaterally likely scarring and/or atelectasis. No pleural effusion or pneumothorax. Upper Abdomen: Hepatic steatosis.  No acute findings. Musculoskeletal: Scoliosis. No acute osseous abnormality.No suspicious osseous lesion. IMPRESSION: Mild cardiomegaly with mosaic attenuation of the lungs, appearance suggesting pulmonary edema and/or small airways disease. Electronically Signed   By: Maurine Simmering M.D.   On: 03/08/2022 11:37   ECHOCARDIOGRAM COMPLETE  Result Date: 03/08/2022    ECHOCARDIOGRAM REPORT   Patient Name:   Regina Wood Date of Exam: 03/08/2022 Medical Rec #:  [id number]        Height:       61.0 in Accession #:    [id number]       Weight:       283.3 lb Date of Birth:  [birth year]        BSA:          2.190 m Patient Age:    54 years         BP:           107/83 mmHg Patient Gender: F                HR:           103 bpm. Exam Location:  Inpatient Procedure: 2D Echo, Cardiac Doppler, Color Doppler and Intracardiac            Opacification Agent Indications:    CHF acute diastolic  History:        Patient has no prior history of Echocardiogram examinations.                 Signs/Symptoms:Shortness of Breath; Risk Factors:Diabetes,                  Hypertension and Obesity.  Sonographer:    Eartha Inch Referring Phys: [id number] MOHAMMAD L GARBA  Sonographer Comments: Technically difficult study due to poor echo windows and patient is obese. Image acquisition challenging due to patient body habitus and Image acquisition challenging due to respiratory motion. IMPRESSIONS  1. Left ventricular ejection fraction, by estimation, is 60 to 65%. The left ventricle has normal function. The left ventricle has no regional wall motion abnormalities. Left ventricular diastolic parameters were normal.  2. Right ventricular systolic function is normal. The right ventricular size is normal.  3. The mitral valve is normal in structure. No evidence of mitral valve regurgitation. No evidence of mitral stenosis.  4. The aortic valve has an indeterminant number of cusps. Aortic valve regurgitation is not visualized. No aortic stenosis is present. Comparison(s): No prior Echocardiogram. FINDINGS  Left Ventricle: Left ventricular ejection fraction, by estimation, is 60 to 65%. The left ventricle has normal function. The left ventricle has no regional wall motion abnormalities. Definity contrast agent was given IV to delineate the left ventricular  endocardial borders. The left ventricular internal cavity size was normal in size. There is no left ventricular hypertrophy. Left ventricular diastolic parameters were normal. Right Ventricle: The right ventricular size is normal. Right ventricular systolic function is normal. Left Atrium: Left atrial size was normal in size. Right Atrium: Right atrial size was normal in size. Pericardium: There  is no evidence of pericardial effusion. Mitral Valve: The mitral valve is normal in structure. No evidence of mitral valve regurgitation. No evidence of mitral valve stenosis. Tricuspid Valve: The tricuspid valve is normal in structure. Tricuspid valve regurgitation is trivial. No evidence of tricuspid stenosis. Aortic Valve: The aortic valve has an  indeterminant number of cusps. Aortic valve regurgitation is not visualized. No aortic stenosis is present. Pulmonic Valve: The pulmonic valve was normal in structure. Pulmonic valve regurgitation is not visualized. No evidence of pulmonic stenosis. Aorta: The aortic root is normal in size and structure. Venous: The inferior vena cava was not well visualized. IAS/Shunts: The interatrial septum was not well visualized.  LEFT VENTRICLE PLAX 2D LVIDd:         4.30 cm   Diastology LVIDs:         3.10 cm   LV e' medial:    11.10 cm/s LV PW:         1.00 cm   LV E/e' medial:  9.5 LV IVS:        0.70 cm   LV e' lateral:   17.40 cm/s LVOT diam:     1.70 cm   LV E/e' lateral: 6.0 LV SV:         56 LV SV Index:   25 LVOT Area:     2.27 cm  RIGHT VENTRICLE RV S prime:     16.40 cm/s TAPSE (M-mode): 2.2 cm LEFT ATRIUM             Index        RIGHT ATRIUM           Index LA diam:        3.30 cm 1.51 cm/m   RA Area:     11.20 cm LA Vol (A2C):   37.9 ml 17.31 ml/m  RA Volume:   23.20 ml  10.59 ml/m LA Vol (A4C):   37.5 ml 17.12 ml/m LA Biplane Vol: 38.2 ml 17.44 ml/m  AORTIC VALVE LVOT Vmax:   145.00 cm/s LVOT Vmean:  93.600 cm/s LVOT VTI:    0.245 m  AORTA Ao Root diam: 2.60 cm Ao Asc diam:  3.00 cm MITRAL VALVE MV Area (PHT): 3.19 cm     SHUNTS MV Decel Time: 238 msec     Systemic VTI:  0.24 m MV E velocity: 105.00 cm/s  Systemic Diam: 1.70 cm MV A velocity: 67.90 cm/s MV E/A ratio:  1.55 Kirk Ruths MD Electronically signed by Kirk Ruths MD Signature Date/Time: 03/08/2022/9:24:33 AM    Final    DG Chest 2 View  Result Date: 03/07/2022 CLINICAL DATA:  Cough, short of breath, new onset hypoxia EXAM: CHEST - 2 VIEW COMPARISON:  11/06/2019 FINDINGS: Frontal and lateral views of the chest demonstrate stable enlargement the cardiac silhouette. There is increased central vascular congestion with bilateral perihilar airspace disease. No effusion or pneumothorax. No acute bony abnormalities. IMPRESSION: 1.  Constellation of findings suggesting mild congestive heart failure. Underlying infection would be difficult to exclude. Electronically Signed   By: Randa Ngo M.D.   On: 03/07/2022 14:44      Scheduled Meds:  arformoterol  15 mcg Nebulization BID   azithromycin  500 mg Oral Daily   budesonide (PULMICORT) nebulizer solution  0.5 mg Nebulization BID   enoxaparin (LOVENOX) injection  0.5 mg/kg Subcutaneous Q24H   guaiFENesin  600 mg Oral BID   ipratropium-albuterol  3 mL Nebulization TID   losartan  100 mg Oral  Daily   pregabalin  200 mg Oral BID   sodium chloride flush  3 mL Intravenous Q12H   trimethoprim-polymyxin b  1 drop Both Eyes QID   Continuous Infusions:  sodium chloride       LOS: 2 days     Dessa Phi, DO [facility] 03/09/2022, 11:19 AM   Available via Epic secure chat 7am-7pm After these hours, please refer to coverage provider listed on [email or web address]

## 2022-03-10 DIAGNOSIS — J9601 Acute respiratory failure with hypoxia: Secondary | ICD-10-CM | POA: Diagnosis not present

## 2022-03-10 LAB — GLUCOSE, CAPILLARY: Glucose-Capillary: 177 mg/dL — ABNORMAL HIGH (ref 70–99)

## 2022-03-10 MED ORDER — ALPRAZOLAM 0.5 MG PO TABS
0.5000 mg | ORAL_TABLET | Freq: Two times a day (BID) | ORAL | Status: DC | PRN
  Administered 2022-03-11: 0.5 mg via ORAL
  Filled 2022-03-10: qty 1

## 2022-03-10 MED ORDER — IPRATROPIUM-ALBUTEROL 0.5-2.5 (3) MG/3ML IN SOLN: 3.0000 mL | RESPIRATORY_TRACT | Status: DC | PRN

## 2022-03-10 NOTE — Progress Notes (Signed)
SATURATION QUALIFICATIONS: (This note is used to comply with regulatory documentation for home oxygen)  Patient Saturations on Room Air at Rest = 75%  Patient Saturations on Room Air while Ambulating = N/A, 75% on RA at rest  Patient Saturations on 6 Liters of oxygen while Ambulating = 87%  Please briefly explain why patient needs home oxygen: To maintain oxygen saturations > 90% at rest and with activity.  Wyona Almas, PT, DPT Acute Rehabilitation Services Office [phone]

## 2022-03-10 NOTE — Progress Notes (Signed)
PROGRESS NOTE    Regina Wood  TAV:[id number] DOB: 1981/01/07 DOA: 03/07/2022 PCP: Melida Quitter, MD     Brief Narrative:  Regina Wood is a 41 y.o. female with medical history significant of developmental delay, morbid obesity, diabetes who was brought in by family secondary to shortness of breath and cough.  Patient was seen in pulmonology office 03/07/22 due to increasing SOB and cough for 2 weeks.  In the office, she was hypoxic to 70s required NRB.  X-ray showed bilateral opacities, concerning for multifocal pneumonia versus pulmonary edema. She was transferred to the hospital for further care.  New events last 24 hours / Subjective: No new issues, had episode of vomiting yesterday.  Wants IV out. Dad is at bedside  Assessment & Plan:   Principal Problem:   Acute hypoxemic respiratory failure (HCC) Active Problems:   HTN (hypertension)   Developmental delay   Upper respiratory infection, acute   Acute hypoxemic respiratory failure -Desatted to the 70s in office.  On my examination in the ER, on 5 L nasal cannula O2.  Does not wear oxygen at baseline.  Continue to wean as able to maintain saturation >92% -Initial thought was CHF exacerbation.  However BNP is only 82, echocardiogram was unremarkable.  She does not appear fluid overloaded at this time.  Received one-time dose IV Lasix in the ER. -D-dimer negative -CT chest showing mosaic attenuation of the lungs -Question multifocal pneumonia versus other inflammatory disease.  Received vancomycin/cefepime in the ER. -Appreciate pulmonology. Thought to be viral lower respiratory tract infection with air trapping. Recommend respiratory viral panel, trial of atypical coverage, bronchodilators, mucinex, flutter valve.  -Respiratory viral panel positive for rhinovirus  Hypertension -Cozaar  Diabetes mellitus -A1c 7.6 -Farxiga    DVT prophylaxis: Lovenox   Code Status: Full code Family Communication: Dad at bedside   Disposition Plan:  Status is: Inpatient Remains inpatient appropriate because: Requiring O2   Consultants:  Pulmonology   Procedures:  None   Antimicrobials:  Anti-infectives (From admission, onward)    Start     Dose/Rate Route Frequency Ordered Stop   03/09/22 1000  azithromycin (ZITHROMAX) tablet 500 mg  Status:  Discontinued        500 mg Oral Daily 03/09/22 0731 03/09/22 1340   03/08/22 1930  vancomycin (VANCOCIN) 1,750 mg in sodium chloride 0.9 % 500 mL IVPB  Status:  Discontinued        1,750 mg 258.8 mL/hr over 120 Minutes Intravenous Every 24 hours 03/07/22 1842 03/08/22 0754   03/07/22 1645  vancomycin (VANCOCIN) 2,000 mg in sodium chloride 0.9 % 500 mL IVPB        2,000 mg 260 mL/hr over 120 Minutes Intravenous  Once 03/07/22 1637 03/07/22 2018   03/07/22 1615  ceFEPIme (MAXIPIME) 1 g in sodium chloride 0.9 % 100 mL IVPB        1 g 200 mL/hr over 30 Minutes Intravenous  Once 03/07/22 1600 03/07/22 1745        Objective: Vitals:   03/10/22 0036 03/10/22 0545 03/10/22 0905 03/10/22 0922  BP: (!) 113/98 (!) 138/106 114/75   Pulse: 97 (!) 104 95   Resp: 20 20 18    Temp: 98.9 F (37.2 C) 99.4 F (37.4 C) 98.6 F (37 C)   TempSrc: Oral  Oral   SpO2: 91% 92% 94% 94%  Weight:  124.1 kg    Height:        Intake/Output Summary (Last 24 hours) at 03/10/2022  1259 Last data filed at 03/10/2022 0050 Gross per 24 hour  Intake 480 ml  Output --  Net 480 ml    Filed Weights   03/07/22 1731 03/09/22 0641 03/10/22 0545  Weight: 128.5 kg 123 kg 124.1 kg    Examination:  General exam: Appears calm and comfortable  Respiratory system: Clear to auscultation. Respiratory effort normal. No respiratory distress. No conversational dyspnea. On 5L O2  Cardiovascular system: S1 & S2 heard, RRR. No murmurs. No pedal edema. Gastrointestinal system: Abdomen is nondistended, soft and nontender. Normal bowel sounds heard. Central nervous system: Alert. No focal neurological  deficits. Speech clear.  Extremities: Symmetric in appearance  Skin: No rashes, lesions or ulcers on exposed skin   Data Reviewed: I have personally reviewed following labs and imaging studies  CBC: Recent Labs  Lab 03/07/22 1700 03/07/22 1709 03/07/22 2100 03/09/22 0046  WBC 12.3*  --  13.3* 10.8*  NEUTROABS 8.9*  --   --   --   HGB 12.8 13.6 12.7 12.2  HCT 44.2 40.0 43.0 42.2  MCV 77.1*  --  77.3* 77.9*  PLT 233  --  214 202    Basic Metabolic Panel: Recent Labs  Lab 03/07/22 1700 03/07/22 1709 03/07/22 2100 03/08/22 0554 03/09/22 0046  NA 143 137  --  142 144  K 4.2 4.2  --  4.2 3.8  CL 103  --   --  98 94*  CO2 30  --   --  34* 35*  GLUCOSE 148*  --   --  166* 194*  BUN 7  --   --  6 6  CREATININE 0.85  --  0.84 0.82 0.74  CALCIUM 9.1  --   --  8.5* 8.9    GFR: Estimated Creatinine Clearance: 115.5 mL/min (by C-G formula based on SCr of 0.74 mg/dL). Liver Function Tests: Recent Labs  Lab 03/07/22 1700  AST 30  ALT 58*  ALKPHOS 86  BILITOT 2.2*  PROT 6.6  ALBUMIN 3.1*    No results for input(s): "LIPASE", "AMYLASE" in the last 168 hours. No results for input(s): "AMMONIA" in the last 168 hours. Coagulation Profile: No results for input(s): "INR", "PROTIME" in the last 168 hours. Cardiac Enzymes: No results for input(s): "CKTOTAL", "CKMB", "CKMBINDEX", "TROPONINI" in the last 168 hours. BNP (last 3 results) No results for input(s): "PROBNP" in the last 8760 hours. HbA1C: Recent Labs    03/09/22 0046  HGBA1C 7.6*    CBG: Recent Labs  Lab 03/08/22 1640 03/08/22 2135 03/09/22 0635 03/09/22 2131 03/10/22 0605  GLUCAP 121* 118* 161* 150* 177*    Lipid Profile: No results for input(s): "CHOL", "HDL", "LDLCALC", "TRIG", "CHOLHDL", "LDLDIRECT" in the last 72 hours. Thyroid Function Tests: No results for input(s): "TSH", "T4TOTAL", "FREET4", "T3FREE", "THYROIDAB" in the last 72 hours. Anemia Panel: No results for input(s): "VITAMINB12",  "FOLATE", "FERRITIN", "TIBC", "IRON", "RETICCTPCT" in the last 72 hours. Sepsis Labs: Recent Labs  Lab 03/07/22 1700  LATICACIDVEN 1.3     Recent Results (from the past 240 hour(s))  Blood culture (routine x 2)     Status: None (Preliminary result)   Collection Time: 03/07/22  3:54 PM   Specimen: BLOOD  Result Value Ref Range Status   Specimen Description BLOOD LEFT ANTECUBITAL  Final   Special Requests   Final    BOTTLES DRAWN AEROBIC AND ANAEROBIC Blood Culture adequate volume   Culture   Final    NO GROWTH 3 DAYS Performed at [facility], [place] [address], [place], [place] [address]    Report Status PENDING  Incomplete  SARS Coronavirus 2 by RT PCR (hospital order, performed in [facility] hospital lab) *cepheid single result test* Anterior Nasal Swab     Status: None   Collection Time: 03/07/22  3:54 PM   Specimen: Anterior Nasal Swab  Result Value Ref Range Status   SARS Coronavirus 2 by RT PCR NEGATIVE NEGATIVE Final    Comment: (NOTE) SARS-CoV-2 target nucleic acids are NOT DETECTED.  The SARS-CoV-2 RNA is generally detectable in upper and lower respiratory specimens during the acute phase of infection. The lowest concentration of SARS-CoV-2 viral copies this assay can detect is 250 copies / mL. A negative result does not preclude SARS-CoV-2 infection and should not be used as the sole basis for treatment or other patient management decisions.  A negative result may occur with improper specimen collection / handling, submission of specimen other than nasopharyngeal swab, presence of viral mutation(s) within the areas targeted by this assay, and inadequate number of viral copies (<250 copies / mL). A negative result must be combined with clinical observations, patient history, and epidemiological information.  Fact Sheet for Patients:   https://www.patel.info/  Fact Sheet for Healthcare  Providers: https://hall.com/  This test is not yet approved or  cleared by the [place] FDA and has been authorized for detection and/or diagnosis of SARS-CoV-2 by FDA under an Emergency Use Authorization (EUA).  This EUA will remain in effect (meaning this test can be used) for the duration of the COVID-19 declaration under Section 564(b)(1) of the Act, 21 U.S.C. section 360bbb-3(b)(1), unless the authorization is terminated or revoked sooner.  Performed at [facility], [place] [address] [place], [place] [address]   Blood culture (routine x 2)     Status: None (Preliminary result)   Collection Time: 03/07/22  5:00 PM   Specimen: BLOOD  Result Value Ref Range Status   Specimen Description BLOOD RIGHT ANTECUBITAL  Final   Special Requests   Final    BOTTLES DRAWN AEROBIC AND ANAEROBIC Blood Culture adequate volume   Culture  Setup Time   Final    NO ORGANISMS SEEN IN BOTH AEROBIC AND ANAEROBIC BOTTLES    Culture   Final    NO GROWTH 3 DAYS Performed at [facility], [place] [address] [place], [place] [address]    Report Status PENDING  Incomplete  MRSA Next Gen by PCR, Nasal     Status: None   Collection Time: 03/07/22  6:39 PM   Specimen: Nasal Mucosa; Nasal Swab  Result Value Ref Range Status   MRSA by PCR Next Gen NOT DETECTED NOT DETECTED Final    Comment: (NOTE) The GeneXpert MRSA Assay (FDA approved for NASAL specimens only), is one component of a comprehensive MRSA colonization surveillance program. It is not intended to diagnose MRSA infection nor to guide or monitor treatment for MRSA infections. Test performance is not FDA approved in patients less than 72 years old. Performed at [facility], [place] [address] [place], [place] [address]   Respiratory (~20 pathogens) panel by PCR     Status: Abnormal   Collection Time: 03/09/22 10:51 AM   Specimen: Nasopharyngeal Swab; Respiratory  Result Value Ref Range Status    Adenovirus NOT DETECTED NOT DETECTED Final   Coronavirus 229E NOT DETECTED NOT DETECTED Final    Comment: (NOTE) The Coronavirus on the Respiratory Panel, DOES NOT test for  the novel  Coronavirus (2019 nCoV)    Coronavirus HKU1 NOT DETECTED NOT DETECTED Final   Coronavirus NL63 NOT DETECTED NOT DETECTED Final   Coronavirus OC43 NOT DETECTED NOT DETECTED Final   Metapneumovirus NOT DETECTED NOT DETECTED Final   Rhinovirus / Enterovirus DETECTED (A) NOT DETECTED Final   Influenza A NOT DETECTED NOT DETECTED Final   Influenza B NOT DETECTED NOT DETECTED Final   Parainfluenza Virus 1 NOT DETECTED NOT DETECTED Final   Parainfluenza Virus 2 NOT DETECTED NOT DETECTED Final   Parainfluenza Virus 3 NOT DETECTED NOT DETECTED Final   Parainfluenza Virus 4 NOT DETECTED NOT DETECTED Final   Respiratory Syncytial Virus NOT DETECTED NOT DETECTED Final   Bordetella pertussis NOT DETECTED NOT DETECTED Final   Bordetella Parapertussis NOT DETECTED NOT DETECTED Final   Chlamydophila pneumoniae NOT DETECTED NOT DETECTED Final   Mycoplasma pneumoniae NOT DETECTED NOT DETECTED Final    Comment: Performed at [facility], [address], [place], [place] [address]      Radiology Studies: No results found.    Scheduled Meds:  arformoterol  15 mcg Nebulization BID   budesonide (PULMICORT) nebulizer solution  0.5 mg Nebulization BID   enoxaparin (LOVENOX) injection  0.5 mg/kg Subcutaneous Q24H   guaiFENesin  600 mg Oral BID   losartan  100 mg Oral Daily   pregabalin  200 mg Oral BID   sodium chloride flush  3 mL Intravenous Q12H   trimethoprim-polymyxin b  1 drop Both Eyes QID   Continuous Infusions:  sodium chloride       LOS: 3 days     Noralee Stain, DO [facility] 03/10/2022, 12:59 PM   Available via Epic secure chat 7am-7pm After these hours, please refer to coverage provider listed on [email or web address]

## 2022-03-10 NOTE — Evaluation (Signed)
Physical Therapy Evaluation Patient Details Name: Regina Wood MRN: [id number] DOB: 01/07/1981 Today's Date: 03/10/2022  History of Present Illness  Pt is a 40 y.o. F who presents with SOB and cough. CT chest showing mosaic attenuation of the lungs. Question of multifocal PNA vs other inflammatory disease. Significant PMH: developmental delay, morbid obesity, diabetes.  Clinical Impression  PTA, pt lives with her family, is independent with mobility and requires assist for IADL's/ADL's in setting of developmental delay. Pt presents with decreased functional mobility secondary to generalized weakness, impaired standing balance, decreased cardiopulmonary endurance. Pt reporting headache and BLE pain. Pt ambulating 40 ft with a walker at a min assist level. Desat to 87% on 6L O2, HR 103-110 bpm. Pt father interested in bariatric Rollator, which will likely be helpful for energy conservation, however, pt will need to demonstrate improved balance and safety with locking brakes. Will continue to assess.     Recommendations for follow up therapy are one component of a multi-disciplinary discharge planning process, led by the attending physician.  Recommendations may be updated based on patient status, additional functional criteria and insurance authorization.  Follow Up Recommendations Home health PT      Assistance Recommended at Discharge Frequent or constant Supervision/Assistance  Patient can return home with the following  A little help with walking and/or transfers;A little help with bathing/dressing/bathroom;Assistance with cooking/housework;Assist for transportation;Help with stairs or ramp for entrance;Direct supervision/assist for medications management    Equipment Recommendations BSC/3in1;Other (comment) (bari RW vs Rollator pending progress tomorrow)  Recommendations for Other Services       Functional Status Assessment Patient has had a recent decline in their functional status  and demonstrates the ability to make significant improvements in function in a reasonable and predictable amount of time.     Precautions / Restrictions Precautions Precautions: Fall;Other (comment) Precaution Comments: watch O2 Restrictions Weight Bearing Restrictions: No      Mobility  Bed Mobility Overal bed mobility: Needs Assistance Bed Mobility: Supine to Sit     Supine to sit: Min assist     General bed mobility comments: Assit for initiating. Increased time/effort    Transfers Overall transfer level: Needs assistance Equipment used: Rolling walker (2 wheels) Transfers: Sit to/from Stand Sit to Stand: Min assist           General transfer comment: MinA to rise and steady    Ambulation/Gait Ambulation/Gait assistance: Min assist Gait Distance (Feet): 40 Feet Assistive device: Rolling walker (2 wheels) Gait Pattern/deviations: Step-through pattern, Decreased stride length, Wide base of support Gait velocity: decreased Gait velocity interpretation: <1.8 ft/sec, indicate of risk for recurrent falls   General Gait Details: Increased lateral sway and sway back posture, knee instability, requiring consistent minA for safety  Stairs            Wheelchair Mobility    Modified Rankin (Stroke Patients Only)       Balance Overall balance assessment: Needs assistance Sitting-balance support: Feet supported Sitting balance-Leahy Scale: Fair     Standing balance support: Bilateral upper extremity supported Standing balance-Leahy Scale: Poor Standing balance comment: reliant on RW and minA                             Pertinent Vitals/Pain Pain Assessment Pain Assessment: Faces Pain Location: BLE's, head Pain Descriptors / Indicators: Headache Pain Intervention(s): Monitored during session, Premedicated before session    Home Living Family/patient expects to be discharged to:: Private  residence Living Arrangements: Parent Available  Help at Discharge: Family Type of Home: House Home Access: Level entry       Home Layout: One level Home Equipment: None      Prior Function Prior Level of Function : Needs assist             Mobility Comments: independent, no history of falls ADLs Comments: pt parents assist with showering, dressing, IADL's     Hand Dominance        Extremity/Trunk Assessment   Upper Extremity Assessment Upper Extremity Assessment: Overall WFL for tasks assessed    Lower Extremity Assessment Lower Extremity Assessment: Generalized weakness    Cervical / Trunk Assessment Cervical / Trunk Assessment: Other exceptions Cervical / Trunk Exceptions: increased body habitus  Communication   Communication: Expressive difficulties (difficult to understand)  Cognition Arousal/Alertness: Awake/alert Behavior During Therapy: WFL for tasks assessed/performed Overall Cognitive Status: History of cognitive impairments - at baseline                                 General Comments: Pt perserverating on asking if her heart was ok and if she was going to die. Increased time for command following        General Comments      Exercises     Assessment/Plan    PT Assessment Patient needs continued PT services  PT Problem List Decreased strength;Decreased balance;Decreased activity tolerance;Decreased mobility       PT Treatment Interventions DME instruction;Gait training;Functional mobility training;Therapeutic activities;Therapeutic exercise;Balance training;Patient/family education    PT Goals (Current goals can be found in the Care Plan section)  Acute Rehab PT Goals Patient Stated Goal: did not state PT Goal Formulation: With patient Time For Goal Achievement: 03/24/22 Potential to Achieve Goals: Good    Frequency Min 3X/week     Co-evaluation               AM-PAC PT "6 Clicks" Mobility  Outcome Measure Help needed turning from your back to your side  while in a flat bed without using bedrails?: A Little Help needed moving from lying on your back to sitting on the side of a flat bed without using bedrails?: A Little Help needed moving to and from a bed to a chair (including a wheelchair)?: A Little Help needed standing up from a chair using your arms (e.g., wheelchair or bedside chair)?: A Little Help needed to walk in hospital room?: A Little Help needed climbing 3-5 steps with a railing? : A Lot 6 Click Score: 17    End of Session Equipment Utilized During Treatment: Gait belt;Oxygen Activity Tolerance: Patient tolerated treatment well Patient left: in chair;with call bell/phone within reach;with chair alarm set;with family/visitor present Nurse Communication: Mobility status PT Visit Diagnosis: Unsteadiness on feet (R26.81);Muscle weakness (generalized) (M62.81);Difficulty in walking, not elsewhere classified (R26.2)    Time: 0973-5329 PT Time Calculation (min) (ACUTE ONLY): 31 min   Charges:   PT Evaluation $PT Eval Moderate Complexity: 1 Mod          Lillia Pauls, PT, DPT Acute Rehabilitation Services Office [phone]   Norval Morton 03/10/2022, 11:08 AM

## 2022-03-11 DIAGNOSIS — J9601 Acute respiratory failure with hypoxia: Secondary | ICD-10-CM | POA: Diagnosis not present

## 2022-03-11 LAB — GLUCOSE, CAPILLARY: Glucose-Capillary: 225 mg/dL — ABNORMAL HIGH (ref 70–99)

## 2022-03-11 MED ORDER — ENOXAPARIN SODIUM 60 MG/0.6ML IJ SOSY
0.5000 mg/kg | PREFILLED_SYRINGE | INTRAMUSCULAR | Status: DC
  Administered 2022-03-11: 62.5 mg via SUBCUTANEOUS
  Filled 2022-03-11: qty 1.2

## 2022-03-11 NOTE — TOC Progression Note (Addendum)
Transition of Care [facility]) - Progression Note    Patient Details  Name: Regina Wood MRN: [id number] Date of Birth: 10/24/1980  [facility] of [facility]) CM/SW Contact  Zenon Mayo, RN Phone Number: 03/11/2022, 1:56 PM  Clinical Narrative:    NCM spoke with patient's Father at the bedside, he states he and his wife take care of patient at home, she can ambulate and take her self to the restroom but may need wife to kind of assist her in wiping where she can no reach.  NCM offered choice from Medicare. Gov list for HHPT.  He states to try [facility].  NCM made referral to [facility] with [facility].  Awaiting call back.  Patient's Father states they will transport her home by car at discharge.  He states she will need a Rolator and she may need home oxygen.  NCM offered choice, he states he does not have a preference of the DME company.  [facility]att[facility] called back states they can take referral for HHPT,  soc will begin 24 to 48hrs post dc.     Expected Discharge Plan: Home/Self Care Barriers to Discharge: Continued Medical Work up  Expected Discharge Plan and Services Expected Discharge Plan: Home/Self Care       Living arrangements for the past 2 months: Single Family Home                                       Social Determinants of Health (SDOH) Interventions    Readmission Risk Interventions     No data to display

## 2022-03-11 NOTE — Significant Event (Signed)
Pt placed on continuous pulse oximetry overnight.  When pt removes O2, sats decrease to 70-80s. RN placed O2 back on pt multiple times overnight and reminded pt of importance of O2. Mother staying overnight in recliner and also educated mother about O2.  When O2 placed back, takes 1-2 minutes for sats to increase back to 90s on 5Lnc.   Pt c/o nausea and right sided abd pain in evening. Night MD notified and tylenol and zofran given. Pt sleeping overnight except when RN/NT need to wake pt to place O2 back on.  In am pt c/o headache; toradol given and pt returned to sleep.

## 2022-03-11 NOTE — Progress Notes (Signed)
SATURATION QUALIFICATIONS: (This note is used to comply with regulatory documentation for home oxygen)  Patient Saturations on Room Air at Rest = 73%   On room air at rest patient O2 saturations drop to 70-75% O2.

## 2022-03-11 NOTE — Progress Notes (Signed)
PROGRESS NOTE    Regina Wood  OVF:[id number] DOB: [birth year] DOA: 03/07/2022 PCP: Melida Quitter, MD     Brief Narrative:  Regina Wood is a 41 y.o. female with medical history significant of developmental delay, morbid obesity, diabetes who was brought in by family secondary to shortness of breath and cough.  Patient was seen in pulmonology office 03/07/22 due to increasing SOB and cough for 2 weeks.  In the office, she was hypoxic to 70s required NRB.  X-ray showed bilateral opacities, concerning for multifocal pneumonia versus pulmonary edema. She was transferred to the hospital for further care.  New events last 24 hours / Subjective: No new issues.  Had some abdominal pains yesterday.  Remains on 5 L oxygen.  Discussed outpatient sleep study with parents at bedside  Assessment & Plan:   Principal Problem:   Acute hypoxemic respiratory failure (HCC) Active Problems:   HTN (hypertension)   Developmental delay   Upper respiratory infection, acute   Acute hypoxemic respiratory failure -Desatted to the 70s in office.  Does not wear oxygen at baseline.  Continue to wean as able to maintain saturation >92% -Initial thought was CHF exacerbation.  However BNP is only 82, echocardiogram was unremarkable.  She does not appear fluid overloaded at this time.  Received one-time dose IV Lasix in the ER. -D-dimer negative -CT chest showing mosaic attenuation of the lungs -Question multifocal pneumonia versus other inflammatory disease.  Received vancomycin/cefepime in the ER. -Appreciate pulmonology. Thought to be viral lower respiratory tract infection with air trapping. Recommend respiratory viral panel, trial of atypical coverage, bronchodilators, mucinex, flutter valve.  -Respiratory viral panel positive for rhinovirus -Continue to wean oxygen as able, she will likely need home oxygen for discharge  Hypertension -Cozaar  Diabetes mellitus -A1c 7.6 -Farxiga    DVT  prophylaxis: Lovenox   Code Status: Full code Family Communication: Parents at bedside  Disposition Plan:  Status is: Inpatient Remains inpatient appropriate because: Requiring O2   Consultants:  Pulmonology   Procedures:  None   Antimicrobials:  Anti-infectives (From admission, onward)    Start     Dose/Rate Route Frequency Ordered Stop   03/09/22 1000  azithromycin (ZITHROMAX) tablet 500 mg  Status:  Discontinued        500 mg Oral Daily 03/09/22 0731 03/09/22 1340   03/08/22 1930  vancomycin (VANCOCIN) 1,750 mg in sodium chloride 0.9 % 500 mL IVPB  Status:  Discontinued        1,750 mg 258.8 mL/hr over 120 Minutes Intravenous Every 24 hours 03/07/22 1842 03/08/22 0754   03/07/22 1645  vancomycin (VANCOCIN) 2,000 mg in sodium chloride 0.9 % 500 mL IVPB        2,000 mg 260 mL/hr over 120 Minutes Intravenous  Once 03/07/22 1637 03/07/22 2018   03/07/22 1615  ceFEPIme (MAXIPIME) 1 g in sodium chloride 0.9 % 100 mL IVPB        1 g 200 mL/hr over 30 Minutes Intravenous  Once 03/07/22 1600 03/07/22 1745        Objective: Vitals:   03/11/22 0200 03/11/22 0422 03/11/22 0500 03/11/22 0600  BP:  (!) 127/48    Pulse: 86  94 87  Resp:  20    Temp:  97.8 F (36.6 C)    TempSrc:  Oral    SpO2: 100%  95% 97%  Weight:      Height:        Intake/Output Summary (Last 24 hours) at 03/11/2022  1044 Last data filed at 03/10/2022 2335 Gross per 24 hour  Intake 240 ml  Output --  Net 240 ml    Filed Weights   03/07/22 1731 03/09/22 0641 03/10/22 0545  Weight: 128.5 kg 123 kg 124.1 kg    Examination:  General exam: Appears calm and comfortable  Respiratory system: Clear to auscultation. Respiratory effort normal. No respiratory distress. No conversational dyspnea. On 5L O2  Cardiovascular system: S1 & S2 heard, RRR. No murmurs. No pedal edema. Gastrointestinal system: Abdomen is nondistended, soft and nontender. Normal bowel sounds heard. Central nervous system: Alert. No  focal neurological deficits. Speech clear.  Extremities: Symmetric in appearance  Skin: No rashes, lesions or ulcers on exposed skin   Data Reviewed: I have personally reviewed following labs and imaging studies  CBC: Recent Labs  Lab 03/07/22 1700 03/07/22 1709 03/07/22 2100 03/09/22 0046  WBC 12.3*  --  13.3* 10.8*  NEUTROABS 8.9*  --   --   --   HGB 12.8 13.6 12.7 12.2  HCT 44.2 40.0 43.0 42.2  MCV 77.1*  --  77.3* 77.9*  PLT 233  --  214 202    Basic Metabolic Panel: Recent Labs  Lab 03/07/22 1700 03/07/22 1709 03/07/22 2100 03/08/22 0554 03/09/22 0046  NA 143 137  --  142 144  K 4.2 4.2  --  4.2 3.8  CL 103  --   --  98 94*  CO2 30  --   --  34* 35*  GLUCOSE 148*  --   --  166* 194*  BUN 7  --   --  6 6  CREATININE 0.85  --  0.84 0.82 0.74  CALCIUM 9.1  --   --  8.5* 8.9    GFR: Estimated Creatinine Clearance: 115.5 mL/min (by C-G formula based on SCr of 0.74 mg/dL). Liver Function Tests: Recent Labs  Lab 03/07/22 1700  AST 30  ALT 58*  ALKPHOS 86  BILITOT 2.2*  PROT 6.6  ALBUMIN 3.1*    No results for input(s): "LIPASE", "AMYLASE" in the last 168 hours. No results for input(s): "AMMONIA" in the last 168 hours. Coagulation Profile: No results for input(s): "INR", "PROTIME" in the last 168 hours. Cardiac Enzymes: No results for input(s): "CKTOTAL", "CKMB", "CKMBINDEX", "TROPONINI" in the last 168 hours. BNP (last 3 results) No results for input(s): "PROBNP" in the last 8760 hours. HbA1C: Recent Labs    03/09/22 0046  HGBA1C 7.6*    CBG: Recent Labs  Lab 03/08/22 2135 03/09/22 0635 03/09/22 2131 03/10/22 0605 03/11/22 0024  GLUCAP 118* 161* 150* 177* 225*    Lipid Profile: No results for input(s): "CHOL", "HDL", "LDLCALC", "TRIG", "CHOLHDL", "LDLDIRECT" in the last 72 hours. Thyroid Function Tests: No results for input(s): "TSH", "T4TOTAL", "FREET4", "T3FREE", "THYROIDAB" in the last 72 hours. Anemia Panel: No results for  input(s): "VITAMINB12", "FOLATE", "FERRITIN", "TIBC", "IRON", "RETICCTPCT" in the last 72 hours. Sepsis Labs: Recent Labs  Lab 03/07/22 1700  LATICACIDVEN 1.3     Recent Results (from the past 240 hour(s))  Blood culture (routine x 2)     Status: None (Preliminary result)   Collection Time: 03/07/22  3:54 PM   Specimen: BLOOD  Result Value Ref Range Status   Specimen Description BLOOD LEFT ANTECUBITAL  Final   Special Requests   Final    BOTTLES DRAWN AEROBIC AND ANAEROBIC Blood Culture adequate volume   Culture   Final    NO GROWTH 4 DAYS Performed at [facility]  [facility], [address], [place], [place] [address]    Report Status PENDING  Incomplete  SARS Coronavirus 2 by RT PCR (hospital order, performed in [facility] hospital lab) *cepheid single result test* Anterior Nasal Swab     Status: None   Collection Time: 03/07/22  3:54 PM   Specimen: Anterior Nasal Swab  Result Value Ref Range Status   SARS Coronavirus 2 by RT PCR NEGATIVE NEGATIVE Final    Comment: (NOTE) SARS-CoV-2 target nucleic acids are NOT DETECTED.  The SARS-CoV-2 RNA is generally detectable in upper and lower respiratory specimens during the acute phase of infection. The lowest concentration of SARS-CoV-2 viral copies this assay can detect is 250 copies / mL. A negative result does not preclude SARS-CoV-2 infection and should not be used as the sole basis for treatment or other patient management decisions.  A negative result may occur with improper specimen collection / handling, submission of specimen other than nasopharyngeal swab, presence of viral mutation(s) within the areas targeted by this assay, and inadequate number of viral copies (<250 copies / mL). A negative result must be combined with clinical observations, patient history, and epidemiological information.  Fact Sheet for Patients:   RoadLapTop.co.za  Fact Sheet for Healthcare  Providers: http://kim-miller.com/  This test is not yet approved or  cleared by the [place] FDA and has been authorized for detection and/or diagnosis of SARS-CoV-2 by FDA under an Emergency Use Authorization (EUA).  This EUA will remain in effect (meaning this test can be used) for the duration of the COVID-19 declaration under Section 564(b)(1) of the Act, 21 U.S.C. section 360bbb-3(b)(1), unless the authorization is terminated or revoked sooner.  Performed at [facility], [address], [place], [place] [address]   Blood culture (routine x 2)     Status: None (Preliminary result)   Collection Time: 03/07/22  5:00 PM   Specimen: BLOOD  Result Value Ref Range Status   Specimen Description BLOOD RIGHT ANTECUBITAL  Final   Special Requests   Final    BOTTLES DRAWN AEROBIC AND ANAEROBIC Blood Culture adequate volume   Culture  Setup Time   Final    NO ORGANISMS SEEN IN BOTH AEROBIC AND ANAEROBIC BOTTLES    Culture   Final    NO GROWTH 4 DAYS Performed at [facility], [address] [place], [place] [address]    Report Status PENDING  Incomplete  MRSA Next Gen by PCR, Nasal     Status: None   Collection Time: 03/07/22  6:39 PM   Specimen: Nasal Mucosa; Nasal Swab  Result Value Ref Range Status   MRSA by PCR Next Gen NOT DETECTED NOT DETECTED Final    Comment: (NOTE) The GeneXpert MRSA Assay (FDA approved for NASAL specimens only), is one component of a comprehensive MRSA colonization surveillance program. It is not intended to diagnose MRSA infection nor to guide or monitor treatment for MRSA infections. Test performance is not FDA approved in patients less than 64 years old. Performed at [facility], [address] [place], [place] [address]   Respiratory (~20 pathogens) panel by PCR     Status: Abnormal   Collection Time: 03/09/22 10:51 AM   Specimen: Nasopharyngeal Swab; Respiratory  Result Value Ref Range Status    Adenovirus NOT DETECTED NOT DETECTED Final   Coronavirus 229E NOT DETECTED NOT DETECTED Final    Comment: (NOTE) The Coronavirus on the Respiratory Panel, DOES NOT test for  the novel  Coronavirus (2019 nCoV)    Coronavirus HKU1 NOT DETECTED NOT DETECTED Final   Coronavirus NL63 NOT DETECTED NOT DETECTED Final   Coronavirus OC43 NOT DETECTED NOT DETECTED Final   Metapneumovirus NOT DETECTED NOT DETECTED Final   Rhinovirus / Enterovirus DETECTED (A) NOT DETECTED Final   Influenza A NOT DETECTED NOT DETECTED Final   Influenza B NOT DETECTED NOT DETECTED Final   Parainfluenza Virus 1 NOT DETECTED NOT DETECTED Final   Parainfluenza Virus 2 NOT DETECTED NOT DETECTED Final   Parainfluenza Virus 3 NOT DETECTED NOT DETECTED Final   Parainfluenza Virus 4 NOT DETECTED NOT DETECTED Final   Respiratory Syncytial Virus NOT DETECTED NOT DETECTED Final   Bordetella pertussis NOT DETECTED NOT DETECTED Final   Bordetella Parapertussis NOT DETECTED NOT DETECTED Final   Chlamydophila pneumoniae NOT DETECTED NOT DETECTED Final   Mycoplasma pneumoniae NOT DETECTED NOT DETECTED Final    Comment: Performed at [facility], [place] [address] [place], [place] [address]      Radiology Studies: No results found.    Scheduled Meds:  arformoterol  15 mcg Nebulization BID   budesonide (PULMICORT) nebulizer solution  0.5 mg Nebulization BID   enoxaparin (LOVENOX) injection  0.5 mg/kg Subcutaneous Q24H   guaiFENesin  600 mg Oral BID   losartan  100 mg Oral Daily   pregabalin  200 mg Oral BID   sodium chloride flush  3 mL Intravenous Q12H   trimethoprim-polymyxin b  1 drop Both Eyes QID   Continuous Infusions:  sodium chloride       LOS: 4 days     Dessa Phi, DO [facility] 03/11/2022, 10:44 AM   Available via Epic secure chat 7am-7pm After these hours, please refer to coverage provider listed on [email or web address]

## 2022-03-11 NOTE — Progress Notes (Signed)
Physical Therapy Treatment Patient Details Name: Regina Wood MRN: [id number] DOB: 10-Nov-1980 Today's Date: 03/11/2022   History of Present Illness Pt is a 41 y.o. F who presents with SOB and cough. CT chest showing mosaic attenuation of the lungs. Question of multifocal PNA vs other inflammatory disease. Significant PMH: developmental delay, morbid obesity, diabetes.    PT Comments    The pt was agreeable to session with encouragement, able to demo good progress with hallway ambulation and SpO2 remained sable on 4L O2. The pt had momentary drops to 89%, but recovered quickly to mid 90s with standing or seated rest break. The pt was able to complete hallway ambulation with minA and use of RW or minA and no DME. She remains unsteady and in need of physical assistance both with and without UE support, but requires significantly increased cues for directions and avoiding obstacles when using RW. Discussed home set up regarding multiple chairs around home for seated rest options and he verbalized understanding. Continue to recommend frequent supervision and assist for any OOB mobility as well as HHPT to further improve pt's independence with mobility.    Recommendations for follow up therapy are one component of a multi-disciplinary discharge planning process, led by the attending physician.  Recommendations may be updated based on patient status, additional functional criteria and insurance authorization.  Follow Up Recommendations  Home health PT     Assistance Recommended at Discharge Frequent or constant Supervision/Assistance  Patient can return home with the following A little help with walking and/or transfers;A little help with bathing/dressing/bathroom;Assistance with cooking/housework;Assist for transportation;Help with stairs or ramp for entrance;Direct supervision/assist for medications management   Equipment Recommendations  BSC/3in1 (bari RW)    Recommendations for Other  Services       Precautions / Restrictions Precautions Precautions: Fall;Other (comment) Precaution Comments: watch O2, on 4L this session Restrictions Weight Bearing Restrictions: No     Mobility  Bed Mobility Overal bed mobility: Needs Assistance Bed Mobility: Supine to Sit, Sit to Supine     Supine to sit: Min assist Sit to supine: Min assist   General bed mobility comments: Assit for initiating. Increased time/effort    Transfers Overall transfer level: Needs assistance Equipment used: Rolling walker (2 wheels), None Transfers: Sit to/from Stand Sit to Stand: Min assist           General transfer comment: MinA to rise and steady, pt needing cues to keep hands on RW    Ambulation/Gait Ambulation/Gait assistance: Min assist Gait Distance (Feet): 85 Feet (+ 85 ft after seated rest) Assistive device: Rolling walker (2 wheels), None Gait Pattern/deviations: Step-through pattern, Decreased stride length, Wide base of support, Drifts right/left Gait velocity: decreased Gait velocity interpretation: <1.31 ft/sec, indicative of household ambulator   General Gait Details: pt needing cues for direction, drifting L and R frequently and needing cues to keep hands on RW as well as frequent minA to steady due to anterior-posterior sway when pt stops walking. maintaining eyes closed at times with gait and benefits from cues to keep eyes open. walked 78 ft without AD with similar balance and need for steadying assist, but less distraction by RW     Balance Overall balance assessment: Needs assistance Sitting-balance support: Feet supported Sitting balance-Leahy Scale: Fair     Standing balance support: Bilateral upper extremity supported Standing balance-Leahy Scale: Poor Standing balance comment: able to ambulate with and without RW, benefits from minA with or without DME  Cognition Arousal/Alertness: Awake/alert Behavior During  Therapy: WFL for tasks assessed/performed Overall Cognitive Status: History of cognitive impairments - at baseline                                 General Comments: Pt perserverating on asking if her heart was ok, Increased time for command following        Exercises      General Comments General comments (skin integrity, edema, etc.): SpO2 stable on 4L with gait. low of 89% but quickly recovered to 90s with standing or seated rest.      Pertinent Vitals/Pain Pain Assessment Pain Assessment: Faces Faces Pain Scale: Hurts little more Pain Location: head Pain Descriptors / Indicators: Headache Pain Intervention(s): Limited activity within patient's tolerance, Premedicated before session, Monitored during session, Repositioned     PT Goals (current goals can now be found in the care plan section) Acute Rehab PT Goals Patient Stated Goal: did not state PT Goal Formulation: With patient Time For Goal Achievement: 03/24/22 Potential to Achieve Goals: Good Progress towards PT goals: Progressing toward goals    Frequency    Min 3X/week      PT Plan Current plan remains appropriate       AM-PAC PT "6 Clicks" Mobility   Outcome Measure  Help needed turning from your back to your side while in a flat bed without using bedrails?: A Little Help needed moving from lying on your back to sitting on the side of a flat bed without using bedrails?: A Little Help needed moving to and from a bed to a chair (including a wheelchair)?: A Little Help needed standing up from a chair using your arms (e.g., wheelchair or bedside chair)?: A Little Help needed to walk in hospital room?: A Little Help needed climbing 3-5 steps with a railing? : A Lot 6 Click Score: 17    End of Session Equipment Utilized During Treatment: Gait belt;Oxygen Activity Tolerance: Patient tolerated treatment well Patient left: with family/visitor present;in bed;with bed alarm set;with call  bell/phone within reach Nurse Communication: Mobility status PT Visit Diagnosis: Unsteadiness on feet (R26.81);Muscle weakness (generalized) (M62.81);Difficulty in walking, not elsewhere classified (R26.2)     Time: [id number] PT Time Calculation (min) (ACUTE ONLY): 25 min  Charges:  $Therapeutic Exercise: 8-22 mins $Therapeutic Activity: 8-22 mins                     [place] Carbo, PT, DPT   Acute Rehabilitation Department   Sandra Cockayne 03/11/2022, 12:30 PM

## 2022-03-11 NOTE — Care Management Important Message (Signed)
Important Message  Patient Details  Name: Regina Wood MRN: [id number] Date of Birth: Oct 24, 1980   Medicare Important Message Given:  Yes     Shelda Altes 03/11/2022, 11:52 AM

## 2022-03-12 DIAGNOSIS — R6250 Unspecified lack of expected normal physiological development in childhood: Secondary | ICD-10-CM | POA: Diagnosis not present

## 2022-03-12 DIAGNOSIS — J9601 Acute respiratory failure with hypoxia: Secondary | ICD-10-CM | POA: Diagnosis not present

## 2022-03-12 DIAGNOSIS — J069 Acute upper respiratory infection, unspecified: Secondary | ICD-10-CM | POA: Diagnosis not present

## 2022-03-12 LAB — CULTURE, BLOOD (ROUTINE X 2)
Culture  Setup Time: NONE SEEN
Culture: NO GROWTH
Culture: NO GROWTH
Special Requests: ADEQUATE
Special Requests: ADEQUATE

## 2022-03-12 MED ORDER — COVID-19 MRNA 2023-2024 VACCINE (COMIRNATY) 0.3 ML INJECTION
0.3000 mL | Freq: Once | INTRAMUSCULAR | Status: AC
  Administered 2022-03-12: 0.3 mL via INTRAMUSCULAR
  Filled 2022-03-12: qty 0.3

## 2022-03-12 MED ORDER — ENOXAPARIN SODIUM 60 MG/0.6ML IJ SOSY: 60.0000 mg | PREFILLED_SYRINGE | INTRAMUSCULAR | Status: DC

## 2022-03-12 MED ORDER — MOMETASONE FURO-FORMOTEROL FUM 100-5 MCG/ACT IN AERO: 2.0000 | INHALATION_SPRAY | Freq: Two times a day (BID) | RESPIRATORY_TRACT | 1 refills | Status: DC

## 2022-03-12 NOTE — Progress Notes (Signed)
Pt repeatedly taking her oxygen and POX drops to 74%. Mom is at bedside and doesn't inform staff when pt removes oxygen or she doesn't place it back on pt. I discussed with mother the importance of pt keeping her oxygen because it isn't good for pt oxygen to drop that low. Pt mom verbalized that staff needed to do something to keep the pt's oxygen on. Green mittens were place on pt with mother's consent.

## 2022-03-12 NOTE — Progress Notes (Signed)
Family given AVS instructions. No further questions. Pt escorted off unit and to car via wheelchair and portable O2.

## 2022-03-12 NOTE — Discharge Summary (Signed)
Physician Discharge Summary  Regina Wood YJE:[id number] DOB: Jun 30, 1980 DOA: 03/07/2022  PCP: Michael Boston, MD  Admit date: 03/07/2022 Discharge date: 03/12/2022  Admitted From: Home Disposition:  Home with parents   Recommendations for Outpatient Follow-up:  Follow up with PCP in 1 week Follow-up with pulmonology, recommend outpatient sleep study  Discharge Condition: Stable  CODE STATUS: Full code Diet recommendation: Regular diet  Brief/Interim Summary: Regina Wood is a 41 y.o. female with medical history significant of developmental delay, morbid obesity, diabetes who was brought in by family secondary to shortness of breath and cough.  Patient was seen in pulmonology office 03/07/22 due to increasing SOB and cough for 2 weeks.  In the office, she was hypoxic to 70s required NRB.  X-ray showed bilateral opacities, concerning for multifocal pneumonia versus pulmonary edema. She was transferred to the hospital for further care.  Pulmonology was consulted. Thought to be viral lower respiratory tract infection with air trapping. Recommend respiratory viral panel (positive for rhinovirus), trial of atypical coverage, bronchodilators, mucinex, flutter valve.  Patient required nasal cannula O2, which was ordered for home as well.  Discussed with parents, felt that due to her baseline developmental delay, patient would improved better at home rather than the hospital.  Patient also has significant anxiety related to hospitalization as well.  Discharge Diagnoses:   Principal Problem:   Acute hypoxemic respiratory failure (HCC) Active Problems:   HTN (hypertension)   Developmental delay   Upper respiratory infection, acute   Acute hypoxemic respiratory failure -Desatted to the 3s in office.  Does not wear oxygen at baseline.  Continue to wean as able to maintain saturation >92%.  Patient was set up for oxygen for home use -Initial thought was CHF exacerbation.  However BNP is only  82, echocardiogram was unremarkable.  She does not appear fluid overloaded at this time.  Received one-time dose IV Lasix in the ER. -D-dimer negative -CT chest showing mosaic attenuation of the lungs -Question multifocal pneumonia versus other inflammatory disease.  Received vancomycin/cefepime in the ER. -Appreciate pulmonology. Thought to be viral lower respiratory tract infection with air trapping. Recommend respiratory viral panel, trial of atypical coverage, bronchodilators, mucinex, flutter valve.  -Respiratory viral panel positive for rhinovirus -Rec outpatient sleep study   Hypertension -Cozaar   Diabetes mellitus -A1c 7.6 -Farxiga/Metformin     Discharge Instructions  Discharge Instructions     Call MD for:  difficulty breathing, headache or visual disturbances   Complete by: As directed    Call MD for:  extreme fatigue   Complete by: As directed    Call MD for:  persistant dizziness or light-headedness   Complete by: As directed    Call MD for:  persistant nausea and vomiting   Complete by: As directed    Call MD for:  severe uncontrolled pain   Complete by: As directed    Call MD for:  temperature >100.4   Complete by: As directed    Discharge instructions   Complete by: As directed    You were cared for by a hospitalist during your hospital stay. If you have any questions about your discharge medications or the care you received while you were in the hospital after you are discharged, you can call the unit and ask to speak with the hospitalist on call if the hospitalist that took care of you is not available. Once you are discharged, your primary care physician will handle any further medical issues. Please note that  NO REFILLS for any discharge medications will be authorized once you are discharged, as it is imperative that you return to your primary care physician (or establish a relationship with a primary care physician if you do not have one) for your aftercare  needs so that they can reassess your need for medications and monitor your lab values.   Increase activity slowly   Complete by: As directed       Allergies as of 03/12/2022   No Known Allergies      Medication List     TAKE these medications    acetaminophen 500 MG tablet Commonly known as: TYLENOL Take 1,000 mg by mouth every 6 (six) hours as needed for moderate pain.   blood glucose meter kit and supplies Dispense based on patient and insurance preference. Use up to four times daily as directed. (FOR ICD-10 E10.9, E11.9).   Farxiga 5 MG Tabs tablet Generic drug: dapagliflozin propanediol Take 1 tablet by mouth daily.   losartan 100 MG tablet Commonly known as: COZAAR Take 100 mg by mouth daily.   metFORMIN 500 MG tablet Commonly known as: Glucophage Take 1 tablet (500 mg total) by mouth 2 (two) times daily with a meal.   mometasone-formoterol 100-5 MCG/ACT Aero Commonly known as: DULERA Inhale 2 puffs into the lungs in the morning and at bedtime.   pregabalin 200 MG capsule Commonly known as: Lyrica Take 1 capsule (200 mg total) by mouth 2 (two) times daily.   trimethoprim-polymyxin b ophthalmic solution Commonly known as: POLYTRIM Place 1 drop into both eyes 4 (four) times daily.   VITAMIN B 12 PO Take 1 tablet by mouth daily.               Durable Medical Equipment  (From admission, onward)           Start     Ordered   03/11/22 1614  For home use only DME oxygen  Once       Question Answer Comment  Length of Need 6 Months   Mode or (Route) Nasal cannula   Liters per Minute 3   Frequency Continuous (stationary and portable oxygen unit needed)   Oxygen delivery system Gas      03/11/22 1613   03/11/22 1436  For home use only DME 4 wheeled rolling walker with seat  Once       Question:  Patient needs a walker to treat with the following condition  Answer:  Weakness   03/11/22 [place] Follow up.   Specialty: Home Health Services Why: Agency will contact for apt times Contact information: [place] [address] [facility] Follow up.   Why: Rollator to be delivered to room, home oxygen Contact information: [place] [address] [phone]                No Known Allergies  Consultations: Pulm   Procedures/Studies: CT CHEST W CONTRAST  Result Date: 03/08/2022 CLINICAL DATA:  Respiratory illness, nondiagnostic xray EXAM: CT CHEST WITH CONTRAST TECHNIQUE: Multidetector CT imaging of the chest was performed during intravenous contrast administration. RADIATION DOSE REDUCTION: This exam was performed according to the departmental dose-optimization program which includes automated exposure control, adjustment of the mA and/or kV according to patient size  and/or use of iterative reconstruction technique. CONTRAST:  27m OMNIPAQUE IOHEXOL 350 MG/ML SOLN COMPARISON:  Chest radiograph 03/07/2022 FINDINGS: There is prominent respiratory motion artifact which degrades image quality. Cardiovascular: Mild cardiomegaly.No pericardial disease.Normal size main and branch pulmonary arteries.The thoracic aorta is unremarkable. Mediastinum/Nodes: There are few prominent mediastinal lymph nodes, favored to be reactive.The thyroid is unremarkable.Esophagus is unremarkable.The trachea is unremarkable. Lungs/Pleura: Mosaic attenuation of the lungs. There are band like opacities bilaterally likely scarring and/or atelectasis. No pleural effusion or pneumothorax. Upper Abdomen: Hepatic steatosis.  No acute findings. Musculoskeletal: Scoliosis. No acute osseous abnormality.No suspicious osseous lesion. IMPRESSION: Mild cardiomegaly with mosaic attenuation of the lungs, appearance suggesting pulmonary edema and/or small airways disease. Electronically Signed   By: JMaurine SimmeringM.D.   On:  03/08/2022 11:37   ECHOCARDIOGRAM COMPLETE  Result Date: 03/08/2022    ECHOCARDIOGRAM REPORT   Patient Name:   Regina VILLALONADate of Exam: 03/08/2022 Medical Rec #:  [id number]       Height:       61.0 in Accession #:    [id number]      Weight:       283.3 lb Date of Birth:  [birth year]       BSA:          2.190 m Patient Age:    89years         BP:           107/83 mmHg Patient Gender: F                HR:           103 bpm. Exam Location:  Inpatient Procedure: 2D Echo, Cardiac Doppler, Color Doppler and Intracardiac            Opacification Agent Indications:    CHF acute diastolic  History:        Patient has no prior history of Echocardiogram examinations.                 Signs/Symptoms:Shortness of Breath; Risk Factors:Diabetes,                 Hypertension and Obesity.  Sonographer:    SEartha InchReferring Phys: [id number] L GARBA  Sonographer Comments: Technically difficult study due to poor echo windows and patient is obese. Image acquisition challenging due to patient body habitus and Image acquisition challenging due to respiratory motion. IMPRESSIONS  1. Left ventricular ejection fraction, by estimation, is 60 to 65%. The left ventricle has normal function. The left ventricle has no regional wall motion abnormalities. Left ventricular diastolic parameters were normal.  2. Right ventricular systolic function is normal. The right ventricular size is normal.  3. The mitral valve is normal in structure. No evidence of mitral valve regurgitation. No evidence of mitral stenosis.  4. The aortic valve has an indeterminant number of cusps. Aortic valve regurgitation is not visualized. No aortic stenosis is present. Comparison(s): No prior Echocardiogram. FINDINGS  Left Ventricle: Left ventricular ejection fraction, by estimation, is 60 to 65%. The left ventricle has normal function. The left ventricle has no regional wall motion abnormalities. Definity contrast agent was given IV to delineate the  left ventricular  endocardial borders. The left ventricular internal cavity size was normal in size. There is no left ventricular hypertrophy. Left ventricular diastolic parameters were normal. Right Ventricle: The right ventricular size is normal. Right ventricular systolic function is normal. Left Atrium: Left atrial size was  normal in size. Right Atrium: Right atrial size was normal in size. Pericardium: There is no evidence of pericardial effusion. Mitral Valve: The mitral valve is normal in structure. No evidence of mitral valve regurgitation. No evidence of mitral valve stenosis. Tricuspid Valve: The tricuspid valve is normal in structure. Tricuspid valve regurgitation is trivial. No evidence of tricuspid stenosis. Aortic Valve: The aortic valve has an indeterminant number of cusps. Aortic valve regurgitation is not visualized. No aortic stenosis is present. Pulmonic Valve: The pulmonic valve was normal in structure. Pulmonic valve regurgitation is not visualized. No evidence of pulmonic stenosis. Aorta: The aortic root is normal in size and structure. Venous: The inferior vena cava was not well visualized. IAS/Shunts: The interatrial septum was not well visualized.  LEFT VENTRICLE PLAX 2D LVIDd:         4.30 cm   Diastology LVIDs:         3.10 cm   LV e' medial:    11.10 cm/s LV PW:         1.00 cm   LV E/e' medial:  9.5 LV IVS:        0.70 cm   LV e' lateral:   17.40 cm/s LVOT diam:     1.70 cm   LV E/e' lateral: 6.0 LV SV:         56 LV SV Index:   25 LVOT Area:     2.27 cm  RIGHT VENTRICLE RV S prime:     16.40 cm/s TAPSE (M-mode): 2.2 cm LEFT ATRIUM             Index        RIGHT ATRIUM           Index LA diam:        3.30 cm 1.51 cm/m   RA Area:     11.20 cm LA Vol (A2C):   37.9 ml 17.31 ml/m  RA Volume:   23.20 ml  10.59 ml/m LA Vol (A4C):   37.5 ml 17.12 ml/m LA Biplane Vol: 38.2 ml 17.44 ml/m  AORTIC VALVE LVOT Vmax:   145.00 cm/s LVOT Vmean:  93.600 cm/s LVOT VTI:    0.245 m  AORTA Ao Root  diam: 2.60 cm Ao Asc diam:  3.00 cm MITRAL VALVE MV Area (PHT): 3.19 cm     SHUNTS MV Decel Time: 238 msec     Systemic VTI:  0.24 m MV E velocity: 105.00 cm/s  Systemic Diam: 1.70 cm MV A velocity: 67.90 cm/s MV E/A ratio:  1.55 Kirk Ruths MD Electronically signed by Kirk Ruths MD Signature Date/Time: 03/08/2022/9:24:33 AM    Final    DG Chest 2 View  Result Date: 03/07/2022 CLINICAL DATA:  Cough, short of breath, new onset hypoxia EXAM: CHEST - 2 VIEW COMPARISON:  11/06/2019 FINDINGS: Frontal and lateral views of the chest demonstrate stable enlargement the cardiac silhouette. There is increased central vascular congestion with bilateral perihilar airspace disease. No effusion or pneumothorax. No acute bony abnormalities. IMPRESSION: 1. Constellation of findings suggesting mild congestive heart failure. Underlying infection would be difficult to exclude. Electronically Signed   By: Randa Ngo M.D.   On: 03/07/2022 14:44       Discharge Exam: Vitals:   03/12/22 0826 03/12/22 1029  BP: (!) 139/93   Pulse: 99 97  Resp: 18   Temp: 98.2 F (36.8 C)   SpO2: 91% 97%    General: Pt is alert, awake, not in acute distress Cardiovascular: RRR,  S1/S2 +, no edema Respiratory: CTA bilaterally, no wheezing, no rhonchi, no respiratory distress, no conversational dyspnea  Abdominal: Soft, NT, ND, bowel sounds + Extremities: no edema, no cyanosis Psych: At baseline    The results of significant diagnostics from this hospitalization (including imaging, microbiology, ancillary and laboratory) are listed below for reference.     Microbiology: Recent Results (from the past 240 hour(s))  Blood culture (routine x 2)     Status: None   Collection Time: 03/07/22  3:54 PM   Specimen: BLOOD  Result Value Ref Range Status   Specimen Description BLOOD LEFT ANTECUBITAL  Final   Special Requests   Final    BOTTLES DRAWN AEROBIC AND ANAEROBIC Blood Culture adequate volume   Culture   Final    NO  GROWTH 5 DAYS Performed at [facility], [address], [place], [place] [address]    Report Status 03/12/2022 FINAL  Final  SARS Coronavirus 2 by RT PCR (hospital order, performed in [facility] hospital lab) *cepheid single result test* Anterior Nasal Swab     Status: None   Collection Time: 03/07/22  3:54 PM   Specimen: Anterior Nasal Swab  Result Value Ref Range Status   SARS Coronavirus 2 by RT PCR NEGATIVE NEGATIVE Final    Comment: (NOTE) SARS-CoV-2 target nucleic acids are NOT DETECTED.  The SARS-CoV-2 RNA is generally detectable in upper and lower respiratory specimens during the acute phase of infection. The lowest concentration of SARS-CoV-2 viral copies this assay can detect is 250 copies / mL. A negative result does not preclude SARS-CoV-2 infection and should not be used as the sole basis for treatment or other patient management decisions.  A negative result may occur with improper specimen collection / handling, submission of specimen other than nasopharyngeal swab, presence of viral mutation(s) within the areas targeted by this assay, and inadequate number of viral copies (<250 copies / mL). A negative result must be combined with clinical observations, patient history, and epidemiological information.  Fact Sheet for Patients:   https://www.patel.info/  Fact Sheet for Healthcare Providers: https://hall.com/  This test is not yet approved or  cleared by the [place] FDA and has been authorized for detection and/or diagnosis of SARS-CoV-2 by FDA under an Emergency Use Authorization (EUA).  This EUA will remain in effect (meaning this test can be used) for the duration of the COVID-19 declaration under Section 564(b)(1) of the Act, 21 U.S.C. section 360bbb-3(b)(1), unless the authorization is terminated or revoked sooner.  Performed at [facility], [place] [address], [place], [place] [address]   Blood  culture (routine x 2)     Status: None   Collection Time: 03/07/22  5:00 PM   Specimen: BLOOD  Result Value Ref Range Status   Specimen Description BLOOD RIGHT ANTECUBITAL  Final   Special Requests   Final    BOTTLES DRAWN AEROBIC AND ANAEROBIC Blood Culture adequate volume   Culture  Setup Time   Final    NO ORGANISMS SEEN IN BOTH AEROBIC AND ANAEROBIC BOTTLES    Culture   Final    NO GROWTH 5 DAYS Performed at [facility], [place] [address], [place], [place] [address]    Report Status 03/12/2022 FINAL  Final  MRSA Next Gen by PCR, Nasal     Status: None   Collection Time: 03/07/22  6:39 PM   Specimen: Nasal Mucosa; Nasal Swab  Result Value Ref Range Status   MRSA by  PCR Next Gen NOT DETECTED NOT DETECTED Final    Comment: (NOTE) The GeneXpert MRSA Assay (FDA approved for NASAL specimens only), is one component of a comprehensive MRSA colonization surveillance program. It is not intended to diagnose MRSA infection nor to guide or monitor treatment for MRSA infections. Test performance is not FDA approved in patients less than 37 years old. Performed at [facility], [place] [address] [place], [place] [address]   Respiratory (~20 pathogens) panel by PCR     Status: Abnormal   Collection Time: 03/09/22 10:51 AM   Specimen: Nasopharyngeal Swab; Respiratory  Result Value Ref Range Status   Adenovirus NOT DETECTED NOT DETECTED Final   Coronavirus 229E NOT DETECTED NOT DETECTED Final    Comment: (NOTE) The Coronavirus on the Respiratory Panel, DOES NOT test for the novel  Coronavirus (2019 nCoV)    Coronavirus HKU1 NOT DETECTED NOT DETECTED Final   Coronavirus NL63 NOT DETECTED NOT DETECTED Final   Coronavirus OC43 NOT DETECTED NOT DETECTED Final   Metapneumovirus NOT DETECTED NOT DETECTED Final   Rhinovirus / Enterovirus DETECTED (A) NOT DETECTED Final   Influenza A NOT DETECTED NOT DETECTED Final   Influenza B NOT DETECTED NOT DETECTED Final   Parainfluenza Virus 1  NOT DETECTED NOT DETECTED Final   Parainfluenza Virus 2 NOT DETECTED NOT DETECTED Final   Parainfluenza Virus 3 NOT DETECTED NOT DETECTED Final   Parainfluenza Virus 4 NOT DETECTED NOT DETECTED Final   Respiratory Syncytial Virus NOT DETECTED NOT DETECTED Final   Bordetella pertussis NOT DETECTED NOT DETECTED Final   Bordetella Parapertussis NOT DETECTED NOT DETECTED Final   Chlamydophila pneumoniae NOT DETECTED NOT DETECTED Final   Mycoplasma pneumoniae NOT DETECTED NOT DETECTED Final    Comment: Performed at [facility], [address] [place], [place] [address]     Labs: BNP (last 3 results) Recent Labs    03/07/22 1700  BNP 35.5   Basic Metabolic Panel: Recent Labs  Lab 03/07/22 1700 03/07/22 1709 03/07/22 2100 03/08/22 0554 03/09/22 0046  NA 143 137  --  142 144  K 4.2 4.2  --  4.2 3.8  CL 103  --   --  98 94*  CO2 30  --   --  34* 35*  GLUCOSE 148*  --   --  166* 194*  BUN 7  --   --  6 6  CREATININE 0.85  --  0.84 0.82 0.74  CALCIUM 9.1  --   --  8.5* 8.9   Liver Function Tests: Recent Labs  Lab 03/07/22 1700  AST 30  ALT 58*  ALKPHOS 86  BILITOT 2.2*  PROT 6.6  ALBUMIN 3.1*   No results for input(s): "LIPASE", "AMYLASE" in the last 168 hours. No results for input(s): "AMMONIA" in the last 168 hours. CBC: Recent Labs  Lab 03/07/22 1700 03/07/22 1709 03/07/22 2100 03/09/22 0046  WBC 12.3*  --  13.3* 10.8*  NEUTROABS 8.9*  --   --   --   HGB 12.8 13.6 12.7 12.2  HCT 44.2 40.0 43.0 42.2  MCV 77.1*  --  77.3* 77.9*  PLT 233  --  214 202   Cardiac Enzymes: No results for input(s): "CKTOTAL", "CKMB", "CKMBINDEX", "TROPONINI" in the last 168 hours. BNP: Invalid input(s): "POCBNP" CBG: Recent Labs  Lab 03/08/22 2135 03/09/22 0635 03/09/22 2131 03/10/22 0605 03/11/22 0024  GLUCAP 118* 161* 150* 177* 225*   D-Dimer No results for input(s): "DDIMER" in  the last 72 hours. Hgb A1c No results for input(s): "HGBA1C" in the last 72  hours. Lipid Profile No results for input(s): "CHOL", "HDL", "LDLCALC", "TRIG", "CHOLHDL", "LDLDIRECT" in the last 72 hours. Thyroid function studies No results for input(s): "TSH", "T4TOTAL", "T3FREE", "THYROIDAB" in the last 72 hours.  Invalid input(s): "FREET3" Anemia work up No results for input(s): "VITAMINB12", "FOLATE", "FERRITIN", "TIBC", "IRON", "RETICCTPCT" in the last 72 hours. Urinalysis No results found for: "COLORURINE", "APPEARANCEUR", "LABSPEC", "PHURINE", "GLUCOSEU", "HGBUR", "BILIRUBINUR", "KETONESUR", "PROTEINUR", "UROBILINOGEN", "NITRITE", "LEUKOCYTESUR" Sepsis Labs Recent Labs  Lab 03/07/22 1700 03/07/22 2100 03/09/22 0046  WBC 12.3* 13.3* 10.8*   Microbiology Recent Results (from the past 240 hour(s))  Blood culture (routine x 2)     Status: None   Collection Time: 03/07/22  3:54 PM   Specimen: BLOOD  Result Value Ref Range Status   Specimen Description BLOOD LEFT ANTECUBITAL  Final   Special Requests   Final    BOTTLES DRAWN AEROBIC AND ANAEROBIC Blood Culture adequate volume   Culture   Final    NO GROWTH 5 DAYS Performed at [facility], [address] [place], [place] [address]    Report Status 03/12/2022 FINAL  Final  SARS Coronavirus 2 by RT PCR (hospital order, performed in [facility] hospital lab) *cepheid single result test* Anterior Nasal Swab     Status: None   Collection Time: 03/07/22  3:54 PM   Specimen: Anterior Nasal Swab  Result Value Ref Range Status   SARS Coronavirus 2 by RT PCR NEGATIVE NEGATIVE Final    Comment: (NOTE) SARS-CoV-2 target nucleic acids are NOT DETECTED.  The SARS-CoV-2 RNA is generally detectable in upper and lower respiratory specimens during the acute phase of infection. The lowest concentration of SARS-CoV-2 viral copies this assay can detect is 250 copies / mL. A negative result does not preclude SARS-CoV-2 infection and should not be used as the sole basis for treatment or other patient management  decisions.  A negative result may occur with improper specimen collection / handling, submission of specimen other than nasopharyngeal swab, presence of viral mutation(s) within the areas targeted by this assay, and inadequate number of viral copies (<250 copies / mL). A negative result must be combined with clinical observations, patient history, and epidemiological information.  Fact Sheet for Patients:   https://www.patel.info/  Fact Sheet for Healthcare Providers: https://hall.com/  This test is not yet approved or  cleared by the [place] FDA and has been authorized for detection and/or diagnosis of SARS-CoV-2 by FDA under an Emergency Use Authorization (EUA).  This EUA will remain in effect (meaning this test can be used) for the duration of the COVID-19 declaration under Section 564(b)(1) of the Act, 21 U.S.C. section 360bbb-3(b)(1), unless the authorization is terminated or revoked sooner.  Performed at [facility], [place] [address], [place], [place] [address]   Blood culture (routine x 2)     Status: None   Collection Time: 03/07/22  5:00 PM   Specimen: BLOOD  Result Value Ref Range Status   Specimen Description BLOOD RIGHT ANTECUBITAL  Final   Special Requests   Final    BOTTLES DRAWN AEROBIC AND ANAEROBIC Blood Culture adequate volume   Culture  Setup Time   Final    NO ORGANISMS SEEN IN BOTH AEROBIC AND ANAEROBIC BOTTLES    Culture   Final    NO GROWTH 5 DAYS Performed at [facility], [place] [address], [place], [place]  [address]    Report Status 03/12/2022 FINAL  Final  MRSA Next Gen by PCR, Nasal     Status: None   Collection Time: 03/07/22  6:39 PM   Specimen: Nasal Mucosa; Nasal Swab  Result Value Ref Range Status   MRSA by PCR Next Gen NOT DETECTED NOT DETECTED Final    Comment: (NOTE) The GeneXpert MRSA Assay (FDA approved for NASAL specimens only), is one component of a comprehensive MRSA  colonization surveillance program. It is not intended to diagnose MRSA infection nor to guide or monitor treatment for MRSA infections. Test performance is not FDA approved in patients less than 15 years old. Performed at [facility], [place] [address], [place], [place] [address]   Respiratory (~20 pathogens) panel by PCR     Status: Abnormal   Collection Time: 03/09/22 10:51 AM   Specimen: Nasopharyngeal Swab; Respiratory  Result Value Ref Range Status   Adenovirus NOT DETECTED NOT DETECTED Final   Coronavirus 229E NOT DETECTED NOT DETECTED Final    Comment: (NOTE) The Coronavirus on the Respiratory Panel, DOES NOT test for the novel  Coronavirus (2019 nCoV)    Coronavirus HKU1 NOT DETECTED NOT DETECTED Final   Coronavirus NL63 NOT DETECTED NOT DETECTED Final   Coronavirus OC43 NOT DETECTED NOT DETECTED Final   Metapneumovirus NOT DETECTED NOT DETECTED Final   Rhinovirus / Enterovirus DETECTED (A) NOT DETECTED Final   Influenza A NOT DETECTED NOT DETECTED Final   Influenza B NOT DETECTED NOT DETECTED Final   Parainfluenza Virus 1 NOT DETECTED NOT DETECTED Final   Parainfluenza Virus 2 NOT DETECTED NOT DETECTED Final   Parainfluenza Virus 3 NOT DETECTED NOT DETECTED Final   Parainfluenza Virus 4 NOT DETECTED NOT DETECTED Final   Respiratory Syncytial Virus NOT DETECTED NOT DETECTED Final   Bordetella pertussis NOT DETECTED NOT DETECTED Final   Bordetella Parapertussis NOT DETECTED NOT DETECTED Final   Chlamydophila pneumoniae NOT DETECTED NOT DETECTED Final   Mycoplasma pneumoniae NOT DETECTED NOT DETECTED Final    Comment: Performed at [facility], [address], [place], [place] [address]     Patient was seen and examined on the day of discharge and was found to be in stable condition. Time coordinating discharge: 25 minutes including assessment and coordination of care, as well as examination of the patient.   SIGNED:  Dessa Phi, DO [facility] 03/12/2022, 10:51 AM

## 2022-03-12 NOTE — Progress Notes (Signed)
   03/12/22 1213  What Happened  Was fall witnessed? Yes  Who witnessed fall? Mother  Patients activity before fall ambulating-unassisted  Point of contact head  Was patient injured? No  Follow Up  MD notified Dessa Phi  Time MD notified 1200  Family notified Yes - comment (at bedside)  Time family notified 1155  Additional tests No  Simple treatment Other (comment) (none)  Progress note created (see row info) Yes  Adult Fall Risk Assessment  Risk Factor Category (scoring not indicated) Not Applicable  Age 41  Fall History: Fall within 6 months prior to admission 5  Elimination; Bowel and/or Urine Incontinence 0  Elimination; Bowel and/or Urine Urgency/Frequency 0  Medications: includes PCA/Opiates, Anti-convulsants, Anti-hypertensives, Diuretics, Hypnotics, Laxatives, Sedatives, and Psychotropics 3  Patient Care Equipment 0  Mobility-Assistance 2  Mobility-Gait 2  Mobility-Sensory Deficit 0  Altered awareness of immediate physical environment 1  Impulsiveness 2  Lack of understanding of one's physical/cognitive limitations 4  Total Score 19  Patient Fall Risk Level High fall risk  Adult Fall Risk Interventions  Required Bundle Interventions *See Row Information* High fall risk - low, moderate, and high requirements implemented  Additional Interventions Use of appropriate toileting equipment (bedpan, BSC, etc.);Family Supervision;Reorient/diversional activities with confused patients  Screening for Fall Injury Risk (To be completed on HIGH fall risk patients) - Assessing Need for Floor Mats  Risk For Fall Injury- Criteria for Floor Mats Noncompliant with safety precautions  Vitals  Temp 97.9 F (36.6 C)  Temp Source Oral  BP (!) 139/105  MAP (mmHg) 117  BP Location Left Wrist  BP Method Automatic  Patient Position (if appropriate) Sitting  Pulse Rate (!) 101  Pulse Rate Source Monitor  Resp 16  Oxygen Therapy  SpO2 94 %  O2 Device Nasal Cannula  O2 Flow Rate  (L/min) 3 L/min  Pain Assessment  Pain Scale 0-10  Pain Score  (unable to describe)  Pain Type Acute pain  Pain Location Neck  Pain Orientation Posterior  Pain Intervention(s) MD notified (Comment)  Neurological  Neuro (WDL) X  Level of Consciousness Alert  Orientation Level Oriented to person;Oriented to place  Cognition Impulsive;Poor attention/concentration;Poor judgement;Poor safety awareness;Follows commands  Speech Developmentally delayed  Neuro Symptoms Forgetful  Integumentary  Integumentary (WDL) WDL   Mom pressed call light. [place] RN walked in and found pt sitting on floor (oxygen tubing not on pt) and mom sitting in a chair beside pt. Mom said pt fell with her eyes closed and hit her head on the walker. [place] RN placed 2L [place] and oxygen sensor on pt and notified this RN. This RN arrived to room and took a set of vitals (O2 sat in the 80s), oxygen increased to 3L. MD notified of event. Pt then assisted off floor with the help of 3 RNs. Vitals taken again. Pt now complaining of neck stiffness.

## 2022-03-12 NOTE — Progress Notes (Signed)
RT in to room for routine breathing treatments. Upon arrival pt sitting at foot of bed, all bed rails up at this time and oxygen off. Pt stated to this RT that she is ready to go home. Pt educated that she is not leaving at this time. RN and RT assisted pt back to proper placement in bed and oxygen reapplied. Pt placed on 4L with SpO2 86%. [place] increased to 6L with improvement in SpO2 to 94%. Aerosol treatments given. RT will continue to monitor and be available as needed.

## 2022-03-12 NOTE — TOC Transition Note (Addendum)
Transition of Care [facility]) - CM/SW Discharge Note   Patient Details  Name: DALEIGH POLLINGER MRN: [id number] Date of Birth: 1980-05-15  [facility] CM/SW Contact:  Zenon Mayo, RN Phone Number: 03/12/2022, 11:12 AM   Clinical Narrative:    Patient is for dc today, parents will transport her home, oxygen has been delivered to her room. NCM notified Lattie Haw with [facility] that patient is for dc today.  [employer] to deliver the rollator to room.    Final next level of care: [place] Barriers to Discharge: No Barriers Identified   Patient Goals and CMS Choice Patient states their goals for this hospitalization and ongoing recovery are:: return home CMS Medicare.gov Compare Post Acute Care list provided to:: Patient Represenative (must comment) Choice offered to / list presented to : Parent  Discharge Placement                       Discharge Plan and Services                DME Arranged: Walker rolling with seat, Oxygen DME Agency: [employer] Date DME Agency Contacted: 03/12/22 Time DME Agency Contacted: [phone] Representative spoke with at DME Agency: [place]: PT, OT [place] Agency: [facility] [place] Date [place]: 03/11/22 Time [place]: 1300 Representative spoke with at [place]: [place] ([place]) Interventions     Readmission Risk Interventions     No data to display

## 2022-03-12 NOTE — Consult Note (Signed)
   [facility] CM Inpatient Consult   03/12/2022  Regina Wood April 23, 1981 [id number]  [place] [facility] [ACO] Patient: Regina Wood Medicare   Primary Care Provider:  Michael Boston, MD, [facility]  Patient screened for hospitalization with noted to assess for potential [place] Management service needs for post hospital transition for care coordination.  Review of patient's electronic medical record reveals patient is from home and currently in the need for oxygen.  1035 am: [place] and Enteric Precaution : Met with the patient [development delay, asleep] with mother and father at the bedside. Introduced self and reason for the rounding visit.  Father states he manages patients meds, glucose monitoring and oxygen when she is on it. Father endorses PCP and given a reminder appointment follow up card and a 24 hour nurse advise line magnet.  Explained that they may receive a post hospital transition of care [TOC] follow up call.  They were receptive and thanked this Probation officer for the information.   Plan:  Continue to follow progress and disposition to assess for post hospital community care coordination/management needs.  Referral request for community care coordination, if needs changes, no needs currently assessed.  Of note, [facility] Care Management/Population Health does not replace or interfere with any arrangements made by the Inpatient Transition of Care team.  For questions contact:   Natividad Brood, RN BSN [place]  [phone] business mobile phone Toll free office [phone]  *[place]  [phone] Fax number: [phone] [email or web address]

## 2022-03-13 ENCOUNTER — Other Ambulatory Visit: Payer: Self-pay

## 2022-03-13 DIAGNOSIS — J189 Pneumonia, unspecified organism: Secondary | ICD-10-CM

## 2022-03-13 DIAGNOSIS — J069 Acute upper respiratory infection, unspecified: Secondary | ICD-10-CM

## 2022-03-13 DIAGNOSIS — R6250 Unspecified lack of expected normal physiological development in childhood: Secondary | ICD-10-CM

## 2022-03-14 DIAGNOSIS — I509 Heart failure, unspecified: Secondary | ICD-10-CM | POA: Diagnosis not present

## 2022-03-14 DIAGNOSIS — I110 Hypertensive heart disease with heart failure: Secondary | ICD-10-CM | POA: Diagnosis not present

## 2022-03-14 DIAGNOSIS — E119 Type 2 diabetes mellitus without complications: Secondary | ICD-10-CM | POA: Diagnosis not present

## 2022-03-14 DIAGNOSIS — M549 Dorsalgia, unspecified: Secondary | ICD-10-CM | POA: Diagnosis not present

## 2022-03-14 DIAGNOSIS — J9611 Chronic respiratory failure with hypoxia: Secondary | ICD-10-CM | POA: Diagnosis not present

## 2022-03-14 DIAGNOSIS — Z1339 Encounter for screening examination for other mental health and behavioral disorders: Secondary | ICD-10-CM | POA: Diagnosis not present

## 2022-03-14 DIAGNOSIS — Z9181 History of falling: Secondary | ICD-10-CM | POA: Diagnosis not present

## 2022-03-14 DIAGNOSIS — I10 Essential (primary) hypertension: Secondary | ICD-10-CM | POA: Diagnosis not present

## 2022-03-14 DIAGNOSIS — Z0000 Encounter for general adult medical examination without abnormal findings: Secondary | ICD-10-CM | POA: Diagnosis not present

## 2022-03-14 DIAGNOSIS — G8929 Other chronic pain: Secondary | ICD-10-CM | POA: Diagnosis not present

## 2022-03-14 DIAGNOSIS — Z1331 Encounter for screening for depression: Secondary | ICD-10-CM | POA: Diagnosis not present

## 2022-03-14 DIAGNOSIS — Z9981 Dependence on supplemental oxygen: Secondary | ICD-10-CM | POA: Diagnosis not present

## 2022-03-14 DIAGNOSIS — E1121 Type 2 diabetes mellitus with diabetic nephropathy: Secondary | ICD-10-CM | POA: Diagnosis not present

## 2022-03-14 DIAGNOSIS — R69 Illness, unspecified: Secondary | ICD-10-CM | POA: Diagnosis not present

## 2022-03-14 DIAGNOSIS — Z7984 Long term (current) use of oral hypoglycemic drugs: Secondary | ICD-10-CM | POA: Diagnosis not present

## 2022-03-14 DIAGNOSIS — R809 Proteinuria, unspecified: Secondary | ICD-10-CM | POA: Diagnosis not present

## 2022-03-14 DIAGNOSIS — Z6841 Body Mass Index (BMI) 40.0 and over, adult: Secondary | ICD-10-CM | POA: Diagnosis not present

## 2022-03-14 DIAGNOSIS — J9601 Acute respiratory failure with hypoxia: Secondary | ICD-10-CM | POA: Diagnosis not present

## 2022-03-14 DIAGNOSIS — M419 Scoliosis, unspecified: Secondary | ICD-10-CM | POA: Diagnosis not present

## 2022-03-14 DIAGNOSIS — K760 Fatty (change of) liver, not elsewhere classified: Secondary | ICD-10-CM | POA: Diagnosis not present

## 2022-03-14 DIAGNOSIS — M6281 Muscle weakness (generalized): Secondary | ICD-10-CM | POA: Diagnosis not present

## 2022-03-14 DIAGNOSIS — G890 Central pain syndrome: Secondary | ICD-10-CM | POA: Diagnosis not present

## 2022-03-14 DIAGNOSIS — R6250 Unspecified lack of expected normal physiological development in childhood: Secondary | ICD-10-CM | POA: Diagnosis not present

## 2022-03-14 DIAGNOSIS — J069 Acute upper respiratory infection, unspecified: Secondary | ICD-10-CM | POA: Diagnosis not present

## 2022-03-14 DIAGNOSIS — J129 Viral pneumonia, unspecified: Secondary | ICD-10-CM | POA: Diagnosis not present

## 2022-03-15 ENCOUNTER — Telehealth: Payer: Self-pay | Admitting: *Deleted

## 2022-03-15 NOTE — Telephone Encounter (Signed)
Appt was scheduled for pt with RB 12/15. Received a call from [facility] and I told them when pt's appt with RB was. They specified that this was out of the timeframe that they were wanting for pt. Stated to them that this was the soonest we could get pt in to see RB and understanding was verified.

## 2022-03-15 NOTE — Progress Notes (Signed)
  Care Coordination   Note   03/15/2022 Name: Regina Wood MRN: [id number] DOB: November 04, 1980  Regina Wood is a 41 y.o. year old female who sees Wile, Nyoka Cowden, MD for primary care. I reached out to Regina Wood by phone today to offer care coordination services.  Ms. Adelstein was given information about Care Coordination services today including:   The Care Coordination services include support from the care team which includes your Nurse Coordinator, Clinical Social Worker, or Pharmacist.  The Care Coordination team is here to help remove barriers to the health concerns and goals most important to you. Care Coordination services are voluntary, and the patient may decline or stop services at any time by request to their care team member.   Care Coordination Consent Status: Patient mom Regina Wood agreed to services and verbal consent obtained.   Follow up plan:  Telephone appointment with care coordination team member scheduled for:  03/15/22  Encounter Outcome:  Pt. Scheduled  [facility] Coordination Care Guide  Direct Dial: [phone]

## 2022-03-18 ENCOUNTER — Ambulatory Visit: Payer: Self-pay

## 2022-03-18 NOTE — Patient Outreach (Signed)
  Care Coordination   03/18/2022 Name: Regina Wood MRN: [id number] DOB: 07/19/1980   Care Coordination Outreach Attempts:  An unsuccessful telephone outreach was attempted for a scheduled appointment today.  Follow Up Plan:  Additional outreach attempts will be made to offer the patient care coordination information and services.   Encounter Outcome:  No Answer  Care Coordination Interventions Activated:  No   Care Coordination Interventions:  No, not indicated    Delsa Sale, RN, BSN, CCM Care Management Coordinator [facility] Care Management  Direct Phone: [phone]

## 2022-03-27 DIAGNOSIS — R269 Unspecified abnormalities of gait and mobility: Secondary | ICD-10-CM | POA: Diagnosis not present

## 2022-03-27 DIAGNOSIS — Z6841 Body Mass Index (BMI) 40.0 and over, adult: Secondary | ICD-10-CM | POA: Diagnosis not present

## 2022-03-27 DIAGNOSIS — G3184 Mild cognitive impairment, so stated: Secondary | ICD-10-CM | POA: Diagnosis not present

## 2022-03-27 DIAGNOSIS — Z008 Encounter for other general examination: Secondary | ICD-10-CM | POA: Diagnosis not present

## 2022-03-27 DIAGNOSIS — Z8249 Family history of ischemic heart disease and other diseases of the circulatory system: Secondary | ICD-10-CM | POA: Diagnosis not present

## 2022-03-27 DIAGNOSIS — Z806 Family history of leukemia: Secondary | ICD-10-CM | POA: Diagnosis not present

## 2022-03-27 DIAGNOSIS — Z8042 Family history of malignant neoplasm of prostate: Secondary | ICD-10-CM | POA: Diagnosis not present

## 2022-03-27 DIAGNOSIS — I10 Essential (primary) hypertension: Secondary | ICD-10-CM | POA: Diagnosis not present

## 2022-03-27 DIAGNOSIS — Z7984 Long term (current) use of oral hypoglycemic drugs: Secondary | ICD-10-CM | POA: Diagnosis not present

## 2022-03-27 DIAGNOSIS — G219 Secondary parkinsonism, unspecified: Secondary | ICD-10-CM | POA: Diagnosis not present

## 2022-03-27 DIAGNOSIS — E1142 Type 2 diabetes mellitus with diabetic polyneuropathy: Secondary | ICD-10-CM | POA: Diagnosis not present

## 2022-03-27 DIAGNOSIS — Z833 Family history of diabetes mellitus: Secondary | ICD-10-CM | POA: Diagnosis not present

## 2022-04-02 DIAGNOSIS — H16143 Punctate keratitis, bilateral: Secondary | ICD-10-CM | POA: Diagnosis not present

## 2022-04-02 DIAGNOSIS — H538 Other visual disturbances: Secondary | ICD-10-CM | POA: Diagnosis not present

## 2022-04-02 DIAGNOSIS — H04123 Dry eye syndrome of bilateral lacrimal glands: Secondary | ICD-10-CM | POA: Diagnosis not present

## 2022-04-04 ENCOUNTER — Ambulatory Visit: Payer: Self-pay

## 2022-04-04 NOTE — Patient Outreach (Addendum)
  Care Coordination   Initial Visit Note   04/04/2022 Name: Regina Wood MRN: [id number] DOB: 08-01-1980  [name] is a 41 y.o. year old female who sees Wile, Nyoka Cowden, MD for primary care. I spoke with mother Maddalynn Barnard by phone today.  What matters to the patients health and wellness today?  Mother Malachi Bonds would like to have patient make a full recovery from her pneumonia.     Goals Addressed             This Visit's Progress    To make a full recovery from pneumonia       Care Coordination Interventions: Evaluation of current treatment plan related to upper respiratory infection with pneumonia  and patient's adherence to plan as established by provider Placed successful outbound call to mother Malachi Bonds Review of patient status, including review of consultant's reports, relevant laboratory and other test results, and medications completed Determined patient does not have an inhaler prescribed, however the hospital doctor recommended an inhaler upon patient's discharge, this was not completed  Determined patient is recovering well from her pneumonia, she continues to wear Oxygen 3 lts continuous with intermittent desaturation noted on room air while bathing  Mother denies patient having other symptoms of illness and has otherwise returned to baseline  Reviewed upcoming scheduled appointment with Pulmonology scheduled for 04/19/22 with Dr. Delton Coombes  Routed note to PCP Dr. Nadene Rubins with patient status update           SDOH assessments and interventions completed:  No     Care Coordination Interventions:  Yes, provided   Follow up plan: Follow up call scheduled for 04/23/22 @200  PM    Encounter Outcome:  Pt. Visit Completed

## 2022-04-04 NOTE — Patient Instructions (Addendum)
Visit Information  Thank you for taking time to visit with me today. Please don't hesitate to contact me if I can be of assistance to you.   Following are the goals we discussed today:   Goals Addressed             This Visit's Progress    To make a full recovery from pneumonia       Care Coordination Interventions: Evaluation of current treatment plan related to upper respiratory infection with pneumonia  and patient's adherence to plan as established by provider Placed successful outbound call to mother Malachi Bonds Review of patient status, including review of consultant's reports, relevant laboratory and other test results, and medications completed Determined patient does not have an inhaler prescribed, however the hospital doctor recommended an inhaler upon patient's discharge, this was not completed  Determined patient is recovering well from her pneumonia, she continues to wear Oxygen 3 lts continuous with intermittent desaturation noted on room air while bathing  Mother denies patient having other symptoms of illness and has otherwise returned to baseline  Reviewed upcoming scheduled appointment with Pulmonology scheduled for 04/19/22 with Dr. Delton Coombes  Routed note to PCP Dr. Nadene Rubins with patient status update           Our next appointment is by telephone on 04/23/22 at 200 PM   Please call the care guide team at [phone] if you need to cancel or reschedule your appointment.   If you are experiencing a Mental Health or Behavioral Health Crisis or need someone to talk to, please call 1-800-273-TALK (toll free, 24 hour hotline)  Patient verbalizes understanding of instructions and care plan provided today and agrees to view in MyChart. Active MyChart status and patient understanding of how to access instructions and care plan via MyChart confirmed with patient.     Delsa Sale, RN, BSN, CCM Care Management Coordinator [facility] Care Management   Direct Phone: [phone]

## 2022-04-10 DIAGNOSIS — M25561 Pain in right knee: Secondary | ICD-10-CM | POA: Diagnosis not present

## 2022-04-10 DIAGNOSIS — M4120 Other idiopathic scoliosis, site unspecified: Secondary | ICD-10-CM | POA: Diagnosis not present

## 2022-04-10 DIAGNOSIS — M25562 Pain in left knee: Secondary | ICD-10-CM | POA: Diagnosis not present

## 2022-04-10 DIAGNOSIS — M545 Low back pain, unspecified: Secondary | ICD-10-CM | POA: Diagnosis not present

## 2022-04-19 ENCOUNTER — Encounter: Payer: Self-pay | Admitting: Emergency Medicine

## 2022-04-19 ENCOUNTER — Ambulatory Visit (INDEPENDENT_AMBULATORY_CARE_PROVIDER_SITE_OTHER): Payer: Medicare HMO | Admitting: Emergency Medicine

## 2022-04-19 VITALS — BP 120/66 | HR 95 | Ht 60.0 in | Wt 273.4 lb

## 2022-04-19 DIAGNOSIS — J9601 Acute respiratory failure with hypoxia: Secondary | ICD-10-CM | POA: Diagnosis not present

## 2022-04-19 DIAGNOSIS — R0683 Snoring: Secondary | ICD-10-CM | POA: Diagnosis not present

## 2022-04-19 DIAGNOSIS — R9389 Abnormal findings on diagnostic imaging of other specified body structures: Secondary | ICD-10-CM

## 2022-04-19 DIAGNOSIS — J984 Other disorders of lung: Secondary | ICD-10-CM | POA: Diagnosis not present

## 2022-04-19 DIAGNOSIS — R29818 Other symptoms and signs involving the nervous system: Secondary | ICD-10-CM | POA: Diagnosis not present

## 2022-04-19 MED ORDER — AEROCHAMBER MV MISC: 0 refills | Status: AC

## 2022-04-19 MED ORDER — MOMETASONE FURO-FORMOTEROL FUM 100-5 MCG/ACT IN AERO: 2.0000 | INHALATION_SPRAY | Freq: Two times a day (BID) | RESPIRATORY_TRACT | 4 refills | Status: DC

## 2022-04-19 NOTE — Patient Instructions (Addendum)
We will arrange for repeat CT scan of the chest to be done in February 2024. We will arrange for home sleep study We will try starting Dulera 2 puffs twice a day through a spacer.  Rinse and gargle after using. Please continue your oxygen at 3 L/min. Regina Wood in February after your CT scan so we can review the results of your studies and see how you are feeling on the inhaled medication.

## 2022-04-19 NOTE — Assessment & Plan Note (Signed)
Daily morning headaches.  Typical body habitus.  Unclear whether she snores.  We will arrange for home sleep study

## 2022-04-19 NOTE — Progress Notes (Signed)
   Subjective:    Patient ID: Regina Wood, female    DOB: [birth year], 41 y.o.   MRN: [id number]  HPI 41 year old woman, never smoker, with history of developmental delay, obesity, diabetes mellitus, hypertension.  She has a history of pneumonias and hypoxemia, CT imaging consistent with possible air trapping.  Hosp F/u Visit 04/19/22 --Regina Wood is 41 with a history of developmental delay, obesity, diabetes and hypertension.  I am seeing her in the past after hospitalizations for community-acquired pneumonia and associated hypoxemia.  He was seen in our office 11/2 and found to be significantly hypoxemic.  Bilateral opacities on her chest x-ray.  She was admitted to the hospital for what was felt to be a viral pneumonia.  Rhinovirus was positive.  She was discharged on oxygen at 3 liters per minute.  She has been prescribed  Dulera in the past, not currently and not clear that she has never taken.  Today her mother reports that she is doing a bit better. She does see desats when she exerts on RA (like w showering). No aspiration symptoms, no choking, etc. Her cough has resolved.  There have been plans for possible sleep study, has not been done to date Labs 11/26/2021 ANA negative, ACE level 56, SSA/SSB negative, RF negative,  CT chest 03/08/2022 reviewed by me shows few prominent mediastinal nodes, mosaic attenuation bilaterally with some atelectatic opacities bilaterally, question pulmonary edema versus pneumonitis.  More prominent and increased focality than previous scan that was done 01/2020.  Vitals:   04/19/22 1416  BP: 120/66  Pulse: 95  SpO2: 94%  Weight: 273 lb 6.4 oz (124 kg)  Height: 5' (1.524 m)  Gen: Pleasant, obese woman, in no distress   ENT: No lesions,  mouth clear,  oropharynx clear, no postnasal drip   Neck: No JVD, no stridor   Lungs: No use of accessory muscles, very decreased at both bases, distant, no crackles or wheezing on normal respiration   Cardiovascular:  RRR, heart sounds normal, no murmur or gallops, no peripheral edema   Musculoskeletal: No deformities, no cyanosis or clubbing   Neuro: alert, awake, developmental delay and depends on her mom to answer most questions.    Skin: Warm, no lesions or rash  Abnormal CT of the chest There is been a mosaic pattern, most suggestive of air trapping although suppose a diffuse pneumonitis could look similarly.  Her autoimmune labs from July are unremarkable.  Her mother states that she does not show any signs of aspiration.  Etiology not clear.  Certainly she could have undertreated obstructive lung disease.  We will try to initiate empiric Dulera to see if she benefits.  I will repeat her CT scan of the chest in 3 months to assess the infiltrates.  If ultimately we decide this is inconsistent with air trapping then may need to consider formal swallowing evaluation and either transbronchial biopsies or VATS biopsy.  Acute respiratory failure with hypoxia (HCC) Continue oxygen at 3 L/min.  We will work to try to wean  Suspected sleep apnea Daily morning headaches.  Typical body habitus.  Unclear whether she snores.  We will arrange for home sleep study   Levy Pupa, MD, PhD 04/19/2022, 2:47 PM [facility] [phone] or if no answer [phone]

## 2022-04-19 NOTE — Assessment & Plan Note (Signed)
There is been a mosaic pattern, most suggestive of air trapping although suppose a diffuse pneumonitis could look similarly.  Her autoimmune labs from July are unremarkable.  Her mother states that she does not show any signs of aspiration.  Etiology not clear.  Certainly she could have undertreated obstructive lung disease.  We will try to initiate empiric Dulera to see if she benefits.  I will repeat her CT scan of the chest in 3 months to assess the infiltrates.  If ultimately we decide this is inconsistent with air trapping then may need to consider formal swallowing evaluation and either transbronchial biopsies or VATS biopsy.

## 2022-04-19 NOTE — Assessment & Plan Note (Signed)
Continue oxygen at 3 L/min.  We will work to try to wean

## 2022-04-23 ENCOUNTER — Ambulatory Visit: Payer: Self-pay

## 2022-04-23 NOTE — Patient Outreach (Signed)
  Care Coordination   04/23/2022 Name: TALIANA MERSEREAU MRN: [id number] DOB: 03/06/1981   Care Coordination Outreach Attempts:  An unsuccessful telephone outreach was attempted for a scheduled appointment today.  Follow Up Plan:  Additional outreach attempts will be made to offer the patient care coordination information and services.   Encounter Outcome:  No Answer   Care Coordination Interventions:  No, not indicated    Delsa Sale, RN, BSN, CCM Care Management Coordinator [facility] Care Management Direct Phone: [phone]

## 2022-05-06 DIAGNOSIS — J9601 Acute respiratory failure with hypoxia: Secondary | ICD-10-CM | POA: Diagnosis not present

## 2022-05-06 DIAGNOSIS — J069 Acute upper respiratory infection, unspecified: Secondary | ICD-10-CM | POA: Diagnosis not present

## 2022-05-06 DIAGNOSIS — R6250 Unspecified lack of expected normal physiological development in childhood: Secondary | ICD-10-CM | POA: Diagnosis not present

## 2022-05-13 DIAGNOSIS — R6250 Unspecified lack of expected normal physiological development in childhood: Secondary | ICD-10-CM | POA: Diagnosis not present

## 2022-05-13 DIAGNOSIS — K760 Fatty (change of) liver, not elsewhere classified: Secondary | ICD-10-CM | POA: Diagnosis not present

## 2022-05-13 DIAGNOSIS — G890 Central pain syndrome: Secondary | ICD-10-CM | POA: Diagnosis not present

## 2022-05-13 DIAGNOSIS — J9601 Acute respiratory failure with hypoxia: Secondary | ICD-10-CM | POA: Diagnosis not present

## 2022-05-13 DIAGNOSIS — M419 Scoliosis, unspecified: Secondary | ICD-10-CM | POA: Diagnosis not present

## 2022-05-13 DIAGNOSIS — M549 Dorsalgia, unspecified: Secondary | ICD-10-CM | POA: Diagnosis not present

## 2022-05-13 DIAGNOSIS — R69 Illness, unspecified: Secondary | ICD-10-CM | POA: Diagnosis not present

## 2022-05-13 DIAGNOSIS — E119 Type 2 diabetes mellitus without complications: Secondary | ICD-10-CM | POA: Diagnosis not present

## 2022-05-13 DIAGNOSIS — I110 Hypertensive heart disease with heart failure: Secondary | ICD-10-CM | POA: Diagnosis not present

## 2022-05-13 DIAGNOSIS — J069 Acute upper respiratory infection, unspecified: Secondary | ICD-10-CM | POA: Diagnosis not present

## 2022-05-13 DIAGNOSIS — I509 Heart failure, unspecified: Secondary | ICD-10-CM | POA: Diagnosis not present

## 2022-05-14 ENCOUNTER — Telehealth: Payer: Self-pay | Admitting: *Deleted

## 2022-05-14 NOTE — Progress Notes (Unsigned)
  Care Coordination Note  05/14/2022 Name: TEDRA COPPERNOLL MRN: [id number] DOB: 11/24/1980  Regina Wood is a 42 y.o. year old female who is a primary care patient of Jacalyn Lefevre, Jesse Sans, MD and is actively engaged with the care management team. I reached out to Regina Wood by phone today to assist with re-scheduling a follow up visit with the RN Case Manager  Follow up plan: Unsuccessful telephone outreach attempt made. A HIPAA compliant phone message was left for the patient providing contact information and requesting a return call.   [place]  Direct Dial: [phone]

## 2022-05-15 NOTE — Telephone Encounter (Signed)
Marga Hoots, MD Cc: Dierdre Highman, RN Dr Lamonte Sakai - you put in an order on Friday for patient to have a home sleep study but your note says she is on 3 liters of O2.  She can't have a home sleep study and use her O2.  Should this be an inlab order or is it ok for her to do home sleep study off of the O2?  Dr. Lamonte Sakai please advise.

## 2022-05-15 NOTE — Progress Notes (Signed)
  Care Coordination Note  05/15/2022 Name: [name] MRN: [id number] DOB: 1980/07/20  Regina Wood is a 42 y.o. year old female who is a primary care patient of Jacalyn Lefevre, Jesse Sans, MD and is actively engaged with the care management team. I reached out to Regina Wood by phone today to assist with re-scheduling a follow up visit with the RN Case Manager  Follow up plan: Telephone appointment with care management team member scheduled for:06/04/22  [place]  Direct Dial: [phone]

## 2022-05-15 NOTE — Telephone Encounter (Signed)
The home sleep study can be done on RA

## 2022-05-16 DIAGNOSIS — J069 Acute upper respiratory infection, unspecified: Secondary | ICD-10-CM | POA: Diagnosis not present

## 2022-05-16 DIAGNOSIS — R69 Illness, unspecified: Secondary | ICD-10-CM | POA: Diagnosis not present

## 2022-05-16 DIAGNOSIS — M549 Dorsalgia, unspecified: Secondary | ICD-10-CM | POA: Diagnosis not present

## 2022-05-16 DIAGNOSIS — R6250 Unspecified lack of expected normal physiological development in childhood: Secondary | ICD-10-CM | POA: Diagnosis not present

## 2022-05-16 DIAGNOSIS — Z7984 Long term (current) use of oral hypoglycemic drugs: Secondary | ICD-10-CM | POA: Diagnosis not present

## 2022-05-16 DIAGNOSIS — I110 Hypertensive heart disease with heart failure: Secondary | ICD-10-CM | POA: Diagnosis not present

## 2022-05-16 DIAGNOSIS — I509 Heart failure, unspecified: Secondary | ICD-10-CM | POA: Diagnosis not present

## 2022-05-16 DIAGNOSIS — Z9181 History of falling: Secondary | ICD-10-CM | POA: Diagnosis not present

## 2022-05-16 DIAGNOSIS — Z6841 Body Mass Index (BMI) 40.0 and over, adult: Secondary | ICD-10-CM | POA: Diagnosis not present

## 2022-05-16 DIAGNOSIS — K760 Fatty (change of) liver, not elsewhere classified: Secondary | ICD-10-CM | POA: Diagnosis not present

## 2022-05-16 DIAGNOSIS — J9601 Acute respiratory failure with hypoxia: Secondary | ICD-10-CM | POA: Diagnosis not present

## 2022-05-16 DIAGNOSIS — G890 Central pain syndrome: Secondary | ICD-10-CM | POA: Diagnosis not present

## 2022-05-16 DIAGNOSIS — Z9981 Dependence on supplemental oxygen: Secondary | ICD-10-CM | POA: Diagnosis not present

## 2022-05-16 DIAGNOSIS — E119 Type 2 diabetes mellitus without complications: Secondary | ICD-10-CM | POA: Diagnosis not present

## 2022-05-16 DIAGNOSIS — M419 Scoliosis, unspecified: Secondary | ICD-10-CM | POA: Diagnosis not present

## 2022-05-20 ENCOUNTER — Telehealth: Payer: Self-pay | Admitting: Emergency Medicine

## 2022-05-20 DIAGNOSIS — R6250 Unspecified lack of expected normal physiological development in childhood: Secondary | ICD-10-CM | POA: Diagnosis not present

## 2022-05-20 DIAGNOSIS — I509 Heart failure, unspecified: Secondary | ICD-10-CM | POA: Diagnosis not present

## 2022-05-20 DIAGNOSIS — R69 Illness, unspecified: Secondary | ICD-10-CM | POA: Diagnosis not present

## 2022-05-20 DIAGNOSIS — J069 Acute upper respiratory infection, unspecified: Secondary | ICD-10-CM | POA: Diagnosis not present

## 2022-05-20 DIAGNOSIS — K760 Fatty (change of) liver, not elsewhere classified: Secondary | ICD-10-CM | POA: Diagnosis not present

## 2022-05-20 DIAGNOSIS — E119 Type 2 diabetes mellitus without complications: Secondary | ICD-10-CM | POA: Diagnosis not present

## 2022-05-20 DIAGNOSIS — I110 Hypertensive heart disease with heart failure: Secondary | ICD-10-CM | POA: Diagnosis not present

## 2022-05-20 DIAGNOSIS — M419 Scoliosis, unspecified: Secondary | ICD-10-CM | POA: Diagnosis not present

## 2022-05-20 DIAGNOSIS — Z7984 Long term (current) use of oral hypoglycemic drugs: Secondary | ICD-10-CM | POA: Diagnosis not present

## 2022-05-20 DIAGNOSIS — J9601 Acute respiratory failure with hypoxia: Secondary | ICD-10-CM | POA: Diagnosis not present

## 2022-05-20 DIAGNOSIS — Z6841 Body Mass Index (BMI) 40.0 and over, adult: Secondary | ICD-10-CM | POA: Diagnosis not present

## 2022-05-20 DIAGNOSIS — Z9181 History of falling: Secondary | ICD-10-CM | POA: Diagnosis not present

## 2022-05-20 DIAGNOSIS — Z9981 Dependence on supplemental oxygen: Secondary | ICD-10-CM | POA: Diagnosis not present

## 2022-05-20 DIAGNOSIS — M549 Dorsalgia, unspecified: Secondary | ICD-10-CM | POA: Diagnosis not present

## 2022-05-20 DIAGNOSIS — G890 Central pain syndrome: Secondary | ICD-10-CM | POA: Diagnosis not present

## 2022-05-20 NOTE — Telephone Encounter (Signed)
Spoke with Tillie Rung (home health nurse) who states pt is requesting POC. Process to obtain POC was explained to [facility] and pt's mother agreed to bring pt in tomorrow at 2:15 for walk. Nothing further needed at this time.   Routing to Dr. Lamonte Sakai as Juluis Rainier

## 2022-05-21 ENCOUNTER — Ambulatory Visit (INDEPENDENT_AMBULATORY_CARE_PROVIDER_SITE_OTHER): Payer: Medicare HMO

## 2022-05-21 ENCOUNTER — Encounter: Payer: Self-pay | Admitting: Emergency Medicine

## 2022-05-21 ENCOUNTER — Ambulatory Visit (INDEPENDENT_AMBULATORY_CARE_PROVIDER_SITE_OTHER): Payer: Medicare HMO | Admitting: Emergency Medicine

## 2022-05-21 VITALS — BP 128/72 | HR 101 | Ht 60.0 in | Wt 264.6 lb

## 2022-05-21 DIAGNOSIS — R0683 Snoring: Secondary | ICD-10-CM

## 2022-05-21 DIAGNOSIS — J9611 Chronic respiratory failure with hypoxia: Secondary | ICD-10-CM | POA: Diagnosis not present

## 2022-05-21 DIAGNOSIS — R9389 Abnormal findings on diagnostic imaging of other specified body structures: Secondary | ICD-10-CM

## 2022-05-21 DIAGNOSIS — J984 Other disorders of lung: Secondary | ICD-10-CM | POA: Diagnosis not present

## 2022-05-21 NOTE — Progress Notes (Signed)
   Subjective:    Patient ID: Regina Wood, female    DOB: 1981-02-02, 42 y.o.   MRN: [id number]  HPI  ROV 05/21/2022 --follow-up visit 42 year old woman with history of obesity, diabetes, hypertension, developmental delay.  She is here with her mom.  She has history of a mosaic pattern on CT scan of the chest, question pneumonitis versus air trapping.  Hospitalized in November with hypoxemic respiratory failure, suspected viral pneumonia.  She was discharged on oxygen 3 L/min.  She is here today for a walking oxygen titration on pulsed flow so she can get a POC.  We are working on arranging for home sleep study.  At her last visit I tried restarting her on empiric Dulera given suspicion for possible obstructive lung disease. She and her mom are unsure whether the [place] has made any impact.     Vitals:   05/21/22 1403  BP: 128/72  Pulse: (!) 101  SpO2: 94%  Weight: 264 lb 9.6 oz (120 kg)  Height: 5' (1.524 m)  Gen: Pleasant, obese woman, in no distress   ENT: No lesions,  mouth clear,  oropharynx clear, no postnasal drip   Neck: No JVD, no stridor   Lungs: No use of accessory muscles, very decreased at both bases, distant, no crackles or wheezing on normal respiration   Cardiovascular: RRR, heart sounds normal, no murmur or gallops, no peripheral edema   Musculoskeletal: No deformities, no cyanosis or clubbing   Neuro: alert, awake, developmental delay and depends on her mom to answer most questions.    Skin: Warm, no lesions or rash  Abnormal CT of the chest Scattered infiltrates, consistent with either pneumonitis or air trapping.  She did not seem to benefit from [facility] according to her mom.  We have a repeat CT chest planned for 06/07/2022.  We will expand the workup depending on persistence of her infiltrates.  Chronic respiratory failure ([place]) Hypoxia noted during her recent hospitalization.  We titrated her today to 3 L/min pulsed.  We will work on getting her a POC.  She  was also hypoxemic at night, we are planning for home sleep study on room air to determine whether she may need CPAP or BiPAP   Baltazar Apo, MD, PhD 05/21/2022, 2:35 PM [facility] [phone] or if no answer [phone]

## 2022-05-21 NOTE — Assessment & Plan Note (Signed)
Scattered infiltrates, consistent with either pneumonitis or air trapping.  She did not seem to benefit from [facility] according to her mom.  We have a repeat CT chest planned for 06/07/2022.  We will expand the workup depending on persistence of her infiltrates.

## 2022-05-21 NOTE — Patient Instructions (Addendum)
Stop your Dulera.  Keep track of whether you feel your breathing changes in any way off of this medication so we can talk about it next time. We will repeat your CT scan of the chest in February as planned We will perform a home sleep test.  This needs to be done on room air, off of your oxygen.  After the test is completed go back to wearing oxygen every night Walking oximetry today shows that you need to wear 3 L/min pulsed oxygen with exertion.  We will use this to get you a portable oxygen concentrator. Follow Dr. Lamonte Sakai last 06/20/22 as planned.

## 2022-05-21 NOTE — Telephone Encounter (Signed)
Agree thank you 

## 2022-05-21 NOTE — Assessment & Plan Note (Signed)
Hypoxia noted during her recent hospitalization.  We titrated her today to 3 L/min pulsed.  We will work on getting her a POC.  She was also hypoxemic at night, we are planning for home sleep study on room air to determine whether she may need CPAP or BiPAP

## 2022-05-23 DIAGNOSIS — J9601 Acute respiratory failure with hypoxia: Secondary | ICD-10-CM | POA: Diagnosis not present

## 2022-05-27 DIAGNOSIS — J069 Acute upper respiratory infection, unspecified: Secondary | ICD-10-CM | POA: Diagnosis not present

## 2022-05-27 DIAGNOSIS — Z7984 Long term (current) use of oral hypoglycemic drugs: Secondary | ICD-10-CM | POA: Diagnosis not present

## 2022-05-27 DIAGNOSIS — I110 Hypertensive heart disease with heart failure: Secondary | ICD-10-CM | POA: Diagnosis not present

## 2022-05-27 DIAGNOSIS — E119 Type 2 diabetes mellitus without complications: Secondary | ICD-10-CM | POA: Diagnosis not present

## 2022-05-27 DIAGNOSIS — Z6841 Body Mass Index (BMI) 40.0 and over, adult: Secondary | ICD-10-CM | POA: Diagnosis not present

## 2022-05-27 DIAGNOSIS — K760 Fatty (change of) liver, not elsewhere classified: Secondary | ICD-10-CM | POA: Diagnosis not present

## 2022-05-27 DIAGNOSIS — G890 Central pain syndrome: Secondary | ICD-10-CM | POA: Diagnosis not present

## 2022-05-27 DIAGNOSIS — J9601 Acute respiratory failure with hypoxia: Secondary | ICD-10-CM | POA: Diagnosis not present

## 2022-05-27 DIAGNOSIS — Z9981 Dependence on supplemental oxygen: Secondary | ICD-10-CM | POA: Diagnosis not present

## 2022-05-27 DIAGNOSIS — I509 Heart failure, unspecified: Secondary | ICD-10-CM | POA: Diagnosis not present

## 2022-05-27 DIAGNOSIS — R69 Illness, unspecified: Secondary | ICD-10-CM | POA: Diagnosis not present

## 2022-05-27 DIAGNOSIS — M549 Dorsalgia, unspecified: Secondary | ICD-10-CM | POA: Diagnosis not present

## 2022-05-27 DIAGNOSIS — R6250 Unspecified lack of expected normal physiological development in childhood: Secondary | ICD-10-CM | POA: Diagnosis not present

## 2022-05-27 DIAGNOSIS — Z9181 History of falling: Secondary | ICD-10-CM | POA: Diagnosis not present

## 2022-05-27 DIAGNOSIS — M419 Scoliosis, unspecified: Secondary | ICD-10-CM | POA: Diagnosis not present

## 2022-06-04 ENCOUNTER — Ambulatory Visit: Payer: Self-pay

## 2022-06-04 DIAGNOSIS — R69 Illness, unspecified: Secondary | ICD-10-CM | POA: Diagnosis not present

## 2022-06-04 DIAGNOSIS — E119 Type 2 diabetes mellitus without complications: Secondary | ICD-10-CM | POA: Diagnosis not present

## 2022-06-04 DIAGNOSIS — I509 Heart failure, unspecified: Secondary | ICD-10-CM | POA: Diagnosis not present

## 2022-06-04 DIAGNOSIS — G890 Central pain syndrome: Secondary | ICD-10-CM | POA: Diagnosis not present

## 2022-06-04 DIAGNOSIS — I110 Hypertensive heart disease with heart failure: Secondary | ICD-10-CM | POA: Diagnosis not present

## 2022-06-04 DIAGNOSIS — Z9181 History of falling: Secondary | ICD-10-CM | POA: Diagnosis not present

## 2022-06-04 DIAGNOSIS — J069 Acute upper respiratory infection, unspecified: Secondary | ICD-10-CM | POA: Diagnosis not present

## 2022-06-04 DIAGNOSIS — M419 Scoliosis, unspecified: Secondary | ICD-10-CM | POA: Diagnosis not present

## 2022-06-04 DIAGNOSIS — M549 Dorsalgia, unspecified: Secondary | ICD-10-CM | POA: Diagnosis not present

## 2022-06-04 DIAGNOSIS — K760 Fatty (change of) liver, not elsewhere classified: Secondary | ICD-10-CM | POA: Diagnosis not present

## 2022-06-04 DIAGNOSIS — Z6841 Body Mass Index (BMI) 40.0 and over, adult: Secondary | ICD-10-CM | POA: Diagnosis not present

## 2022-06-04 DIAGNOSIS — J9601 Acute respiratory failure with hypoxia: Secondary | ICD-10-CM | POA: Diagnosis not present

## 2022-06-04 DIAGNOSIS — Z7984 Long term (current) use of oral hypoglycemic drugs: Secondary | ICD-10-CM | POA: Diagnosis not present

## 2022-06-04 DIAGNOSIS — Z9981 Dependence on supplemental oxygen: Secondary | ICD-10-CM | POA: Diagnosis not present

## 2022-06-04 DIAGNOSIS — R6250 Unspecified lack of expected normal physiological development in childhood: Secondary | ICD-10-CM | POA: Diagnosis not present

## 2022-06-04 NOTE — Patient Instructions (Signed)
Visit Information  Thank you for taking time to visit with me today. Please don't hesitate to contact me if I can be of assistance to you.   Following are the goals we discussed today:   Goals Addressed             This Visit's Progress    To make a full recovery from pneumonia       Care Coordination Interventions: Evaluation of current treatment plan related to upper respiratory infection with pneumonia  and patient's adherence to plan as established by provider Placed successful outbound call to mother Peter Congo Determined patient completed recent follow up with Dr. Lamonte Sakai, Pulmonologist to evaluate patient's lung status Review of patient status, including review of consultant's reports, relevant laboratory and other test results, and medications completed Determined patient completed a sleep study and is awaiting results Reviewed and discussed Pulmonology recommendations to resume nighttime oxygen once sleep study is completed  Determined mom Peter Congo feels patient has returned to her baseline prior to having pneumonia and is progressing toward wellness  Reviewed scheduled/upcoming provider appointments including: CT chest scheduled for 06/07/22 @11 :30 AM; follow up with Pulmonologist, Dr. Lamonte Sakai scheduled for 06/20/22 @11 :00 AM; PCP f/u with Dr. Jacalyn Lefevre, 07/11/22 @11 :30 AM         Our next appointment is by telephone on 07/15/22 at 09:30 AM   Please call the care guide team at [phone] if you need to cancel or reschedule your appointment.   If you are experiencing a Mental Health or [place] or need someone to talk to, please go to [facility] [place] [phone])  The patient verbalized understanding of instructions, educational materials, and care plan provided today and agreed to receive a mailed copy of patient instructions, educational materials, and care plan.   Barb Merino, RN, BSN, CCM Care Management  Coordinator [facility] Care Management  Direct Phone: [phone]

## 2022-06-04 NOTE — Patient Outreach (Signed)
  Care Coordination   Follow Up Visit Note   06/04/2022 Name: Regina Wood MRN: [id number] DOB: 09-Oct-1980  Regina Wood is a 42 y.o. year old female who sees Wile, Jesse Sans, MD for primary care. I spoke with mom Regina Wood by phone today.  What matters to the patients health and wellness today?  Mother Regina Wood would like for patient to continue to progress toward wellness following a recent episode of pneumonia.     Goals Addressed             This Visit's Progress    To make a full recovery from pneumonia       Care Coordination Interventions: Evaluation of current treatment plan related to upper respiratory infection with pneumonia  and patient's adherence to plan as established by provider Placed successful outbound call to mother Regina Wood Determined patient completed recent follow up with Dr. Lamonte Sakai, Pulmonologist to evaluate patient's lung status Review of patient status, including review of consultant's reports, relevant laboratory and other test results, and medications completed Determined patient completed a sleep study and is awaiting results Reviewed and discussed Pulmonology recommendations to resume nighttime oxygen once sleep study is completed  Determined mom Regina Wood feels patient has returned to her baseline prior to having pneumonia and is progressing toward wellness  Reviewed scheduled/upcoming provider appointments including: CT chest scheduled for 06/07/22 @11 :30 AM; follow up with Pulmonologist, Dr. Lamonte Sakai scheduled for 06/20/22 @11 :00 AM; PCP f/u with Dr. Jacalyn Lefevre, 07/11/22 @11 :30 AM         SDOH assessments and interventions completed:  No     Care Coordination Interventions:  Yes, provided   Follow up plan: Follow up call scheduled for 07/15/22 @09 :30 AM    Encounter Outcome:  Pt. Visit Completed

## 2022-06-05 ENCOUNTER — Telehealth: Payer: Self-pay | Admitting: Pulmonary Disease

## 2022-06-05 ENCOUNTER — Encounter (HOSPITAL_COMMUNITY): Payer: Self-pay

## 2022-06-05 ENCOUNTER — Emergency Department (HOSPITAL_COMMUNITY)
Admission: EM | Admit: 2022-06-05 | Discharge: 2022-06-06 | Disposition: A | Discharge status: Home / Self Care | Payer: Medicare HMO | Attending: Emergency Medicine | Admitting: Emergency Medicine

## 2022-06-05 ENCOUNTER — Emergency Department (HOSPITAL_COMMUNITY): Payer: Medicare HMO

## 2022-06-05 ENCOUNTER — Other Ambulatory Visit: Payer: Self-pay

## 2022-06-05 DIAGNOSIS — H04123 Dry eye syndrome of bilateral lacrimal glands: Secondary | ICD-10-CM | POA: Diagnosis not present

## 2022-06-05 DIAGNOSIS — E119 Type 2 diabetes mellitus without complications: Secondary | ICD-10-CM | POA: Diagnosis not present

## 2022-06-05 DIAGNOSIS — H40033 Anatomical narrow angle, bilateral: Secondary | ICD-10-CM | POA: Diagnosis not present

## 2022-06-05 DIAGNOSIS — R519 Headache, unspecified: Secondary | ICD-10-CM | POA: Diagnosis not present

## 2022-06-05 DIAGNOSIS — H4710 Unspecified papilledema: Secondary | ICD-10-CM | POA: Diagnosis not present

## 2022-06-05 DIAGNOSIS — G932 Benign intracranial hypertension: Secondary | ICD-10-CM | POA: Diagnosis not present

## 2022-06-05 LAB — BASIC METABOLIC PANEL
Anion gap: 11 (ref 5–15)
BUN: 9 mg/dL (ref 6–20)
CO2: 27 mmol/L (ref 22–32)
Calcium: 9.4 mg/dL (ref 8.9–10.3)
Chloride: 102 mmol/L (ref 98–111)
Creatinine, Ser: 0.86 mg/dL (ref 0.44–1.00)
GFR, Estimated: >60 mL/min (ref >60)
Glucose, Bld: 140 mg/dL — ABNORMAL HIGH (ref 70–99)
Potassium: 4.4 mmol/L (ref 3.5–5.1)
Sodium: 140 mmol/L (ref 135–145)

## 2022-06-05 LAB — CBC WITH DIFFERENTIAL/PLATELET
Abs Immature Granulocytes: 0.04 10*3/uL (ref 0.00–0.07)
Basophils Absolute: 0.1 10*3/uL (ref 0.0–0.1)
Basophils Relative: 1 %
Eosinophils Absolute: 0 10*3/uL (ref 0.0–0.5)
Eosinophils Relative: 0 %
HCT: 48.3 % — ABNORMAL HIGH (ref 36.0–46.0)
Hemoglobin: 14.8 g/dL (ref 12.0–15.0)
Immature Granulocytes: 0 %
Lymphocytes Relative: 32 %
Lymphs Abs: 3.8 10*3/uL (ref 0.7–4.0)
MCH: 23.6 pg — ABNORMAL LOW (ref 26.0–34.0)
MCHC: 30.6 g/dL (ref 30.0–36.0)
MCV: 77 fL — ABNORMAL LOW (ref 80.0–100.0)
Monocytes Absolute: 0.6 10*3/uL (ref 0.1–1.0)
Monocytes Relative: 5 %
Neutro Abs: 7.2 10*3/uL (ref 1.7–7.7)
Neutrophils Relative %: 62 %
Platelets: 255 10*3/uL (ref 150–400)
RBC: 6.27 MIL/uL — ABNORMAL HIGH (ref 3.87–5.11)
RDW: 19.8 % — ABNORMAL HIGH (ref 11.5–15.5)
WBC: 11.7 10*3/uL — ABNORMAL HIGH (ref 4.0–10.5)
nRBC: 0 % (ref 0.0–0.2)

## 2022-06-05 MED ORDER — LOSARTAN POTASSIUM 50 MG PO TABS: 100.0000 mg | ORAL_TABLET | Freq: Every day | ORAL | Status: DC

## 2022-06-05 MED ORDER — DIAZEPAM 5 MG/ML IJ SOLN
5.0000 mg | Freq: Once | INTRAMUSCULAR | Status: AC
  Administered 2022-06-05: 5 mg via INTRAVENOUS
  Filled 2022-06-05: qty 2

## 2022-06-05 MED ORDER — LOSARTAN POTASSIUM 50 MG PO TABS
100.0000 mg | ORAL_TABLET | Freq: Every day | ORAL | Status: DC
  Administered 2022-06-05 – 2022-06-06 (×2): 100 mg via ORAL
  Filled 2022-06-05 (×2): qty 2

## 2022-06-05 MED ORDER — DAPAGLIFLOZIN PROPANEDIOL 5 MG PO TABS
5.0000 mg | ORAL_TABLET | Freq: Every day | ORAL | Status: DC
  Administered 2022-06-05 – 2022-06-06 (×2): 5 mg via ORAL
  Filled 2022-06-05 (×2): qty 1

## 2022-06-05 MED ORDER — GADOBUTROL 1 MMOL/ML IV SOLN
10.0000 mL | Freq: Once | INTRAVENOUS | Status: AC | PRN
  Administered 2022-06-05: 10 mL via INTRAVENOUS

## 2022-06-05 MED ORDER — DIAZEPAM 5 MG/ML IJ SOLN
5.0000 mg | Freq: Once | INTRAMUSCULAR | Status: AC | PRN
  Administered 2022-06-05: 5 mg via INTRAVENOUS
  Filled 2022-06-05: qty 2

## 2022-06-05 MED ORDER — ACETAMINOPHEN 500 MG PO TABS: 1000.0000 mg | ORAL_TABLET | Freq: Four times a day (QID) | ORAL | Status: DC | PRN

## 2022-06-05 MED ORDER — HALOPERIDOL LACTATE 5 MG/ML IJ SOLN
2.0000 mg | Freq: Once | INTRAMUSCULAR | Status: AC | PRN
  Administered 2022-06-05: 2 mg via INTRAVENOUS
  Filled 2022-06-05: qty 1

## 2022-06-05 MED ORDER — MOMETASONE FURO-FORMOTEROL FUM 100-5 MCG/ACT IN AERO
2.0000 | INHALATION_SPRAY | Freq: Two times a day (BID) | RESPIRATORY_TRACT | Status: DC
  Filled 2022-06-05: qty 8.8

## 2022-06-05 MED ORDER — HALOPERIDOL LACTATE 5 MG/ML IJ SOLN
5.0000 mg | Freq: Once | INTRAMUSCULAR | Status: AC
  Administered 2022-06-05: 5 mg via INTRAVENOUS
  Filled 2022-06-05: qty 1

## 2022-06-05 MED ORDER — PREGABALIN 100 MG PO CAPS
200.0000 mg | ORAL_CAPSULE | Freq: Two times a day (BID) | ORAL | Status: DC
  Administered 2022-06-05 – 2022-06-06 (×2): 200 mg via ORAL
  Filled 2022-06-05 (×2): qty 2

## 2022-06-05 NOTE — ED Provider Notes (Signed)
  Physical Exam  BP 104/71   Pulse 67   Temp 98.9 F (37.2 C) (Oral)   Resp 18   Ht 5' (1.524 m)   Wt 117.5 kg   SpO2 96%   BMI 50.58 kg/m   Physical Exam  Procedures  .Lumbar Puncture  Date/Time: 06/05/2022 11:31 PM  Performed by: Drenda Freeze, MD Authorized by: Drenda Freeze, MD   Consent:    Consent obtained:  Verbal   Consent given by:  Parent   Risks, benefits, and alternatives were discussed: yes     Risks discussed:  Bleeding and infection   Alternatives discussed:  No treatment Universal protocol:    Procedure explained and questions answered to patient or proxy's satisfaction: yes     Relevant documents present and verified: yes     Test results available: yes     Patient identity confirmed:  Verbally with patient Pre-procedure details:    Procedure purpose:  Diagnostic   Preparation: Patient was prepped and draped in usual sterile fashion   Anesthesia:    Anesthesia method:  None Procedure details:    Lumbar space:  L3-L4 interspace   Patient position:  L lateral decubitus   Needle gauge:  18   Needle type:  Diamond point   Needle length (in):  5.0   Ultrasound guidance: yes     Ultrasound guidance use: view anatomy of the area     Number of attempts:  3 Comments:     I used ultrasound to identify midline.  Unfortunately patient has a large body habitus.  I was unable to obtain any CSF fluid even with the long 18-gauge needle.  I was able to help the needle but no CSF came out.  At this point, I consulted IR to perform LP in the morning   ED Course / MDM    Medical Decision Making I provided a substantive portion of the care of this patient.  I personally performed the entirety of the history, exam, and medical decision making for this encounter.     Regina Wood is a 42 y.o. female here presenting with papilledema.  Patient had some blurry vision and had papilledema on eye exam.  Patient has learning disability and unable to give a  consistent story.  MRI brain did not show any mass.  Unfortunately she has a BMI of 50.  I attempted to perform LP with a long spinal needle and the needle but was unable to get any CSF.  At this point, we consulted IR to perform LP in the morning and will need opening pressure to rule out pseudotumor cerebri.    Amount and/or Complexity of Data Reviewed Labs: ordered. Decision-making details documented in ED Course. Radiology: ordered and independent interpretation performed. Decision-making details documented in ED Course.  Risk OTC drugs. Prescription drug management.          Drenda Freeze, MD 06/05/22 [phone]

## 2022-06-05 NOTE — ED Provider Triage Note (Signed)
Emergency Medicine Provider Triage Evaluation Note  DEONNE ROOKS , a 42 y.o. female  was evaluated in triage.  Pt complains of bilateral optic nerve swelling.  Was seen by Dr. Katy Fitch who is on ophthalmologist at [facility].  They did a dilated retinal exam and noticed bilateral optic nerve swelling.  The recommended she come to the ED for MRI to rule out tumor.  Patient has a note with them.  Denies vision changes or eye pain.  States she feels completely normal Review of Systems  Positive: As above Negative: As above  Physical Exam  BP (!) 148/105 (BP Location: Right Arm)   Pulse (!) 114   Temp 98.9 F (37.2 C) (Oral)   Resp 18   Ht 5' (1.524 m)   Wt 117.5 kg   SpO2 93%   BMI 50.58 kg/m  Gen:   Awake, no distress   Resp:  Normal effort  MSK:   Moves extremities without difficulty Other:    Medical Decision Making  Medically screening exam initiated at 1:03 PM.  Appropriate orders placed.  AKEYA RYTHER was informed that the remainder of the evaluation will be completed by another provider, this initial triage assessment does not replace that evaluation, and the importance of remaining in the ED until their evaluation is complete.     Roylene Reason, [place] 06/05/22 1304

## 2022-06-05 NOTE — ED Triage Notes (Signed)
Pt came in Regina Wood recommended by her eye dr to come in d/t him seeing she had swelling in both of her optic nerves & sent her here for eval. Mother at bedside has a printed note from the provider stating his concerns. Pt denies any pain at this time & mother relays she doe snot have any vision problems from any of this either.

## 2022-06-05 NOTE — Telephone Encounter (Signed)
Call patient  Sleep study result  Date of study: 05/21/2022  Impression: Suboptimal study for determination of sleep disordered breathing, multiple loss of signals in multiple channels.  Recommendation:  If there is significant concern for sleep disordered breathing, will recommend to order an in lab polysomnogram  Encourage weight loss efforts  Follow-up as previously scheduled

## 2022-06-05 NOTE — ED Provider Notes (Signed)
[facility] Provider Note   CSN: [id number] Arrival date & time: 06/05/22  1229     History  No chief complaint on file.   Regina Wood is a 42 y.o. female. Patient is here with her legal guardian for an MRI.  Patient was at a routine ophthalmology appointment and it was noticed that she had papilledema.  It was recommended she come to the emergency room for MRI and workup for possible "tumor".  Patient is developmentally delayed.  She does report some intermittent blurred vision.  Denies headaches.  Denies other complaints at this time. Past medical history significant for type II DM without complication, obesity, history of pneumonia, developmental delay, chronic respiratory failure  HPI     Home Medications Prior to Admission medications   Medication Sig Start Date End Date Taking? Authorizing Provider  acetaminophen (TYLENOL) 500 MG tablet Take 1,000 mg by mouth every 6 (six) hours as needed for moderate pain.    [provider]  blood glucose meter kit and supplies Dispense based on patient and insurance preference. Use up to four times daily as directed. (FOR ICD-10 E10.9, E11.9). 04/24/19   Dessa Phi, DO  Cyanocobalamin (VITAMIN B 12 PO) Take 1 tablet by mouth daily.    [provider]  dapagliflozin propanediol (FARXIGA) 5 MG TABS tablet Take 1 tablet by mouth daily. 06/11/21   [provider]  losartan (COZAAR) 100 MG tablet Take 100 mg by mouth daily. 10/05/21   [provider]  metFORMIN (GLUCOPHAGE) 500 MG tablet Take 1 tablet (500 mg total) by mouth 2 (two) times daily with a meal. 04/24/19 03/07/22  Dessa Phi, DO  mometasone-formoterol (DULERA) 100-5 MCG/ACT AERO Inhale 2 puffs into the lungs 2 (two) times daily. 04/19/22   Collene Gobble, MD  pregabalin (LYRICA) 200 MG capsule Take 1 capsule (200 mg total) by mouth 2 (two) times daily. 11/26/21   Melvenia Beam, MD   Spacer/Aero-Holding Chambers (AEROCHAMBER MV) inhaler Use as instructed 04/19/22   Collene Gobble, MD  trimethoprim-polymyxin b (POLYTRIM) ophthalmic solution Place 1 drop into both eyes 4 (four) times daily. 03/05/22   [provider]      Allergies    Patient has no known allergies.    Review of Systems   Review of Systems  Eyes:  Positive for visual disturbance. Negative for photophobia and pain.  Respiratory:  Negative for shortness of breath.   Cardiovascular:  Negative for chest pain.  Gastrointestinal:  Negative for abdominal pain.  Neurological:  Negative for speech difficulty, weakness and headaches.    Physical Exam Updated Vital Signs BP 104/71   Pulse 67   Temp 98.9 F (37.2 C) (Oral)   Resp 18   Ht 5' (1.524 m)   Wt 117.5 kg   SpO2 96%   BMI 50.58 kg/m  Physical Exam Vitals and nursing note reviewed.  Constitutional:      General: She is not in acute distress.    Appearance: She is well-developed. She is obese.  HENT:     Head: Normocephalic and atraumatic.  Eyes:     Conjunctiva/sclera: Conjunctivae normal.     Funduscopic exam:    Right eye: No hemorrhage or exudate. Red reflex present.        Left eye: No hemorrhage or exudate. Red reflex present. Cardiovascular:     Rate and Rhythm: Normal rate and regular rhythm.     Heart sounds: No murmur  heard. Pulmonary:     Effort: Pulmonary effort is normal. No respiratory distress.     Breath sounds: Normal breath sounds.  Abdominal:     Palpations: Abdomen is soft.     Tenderness: There is no abdominal tenderness.  Musculoskeletal:        General: No swelling.     Cervical back: Neck supple.  Skin:    General: Skin is warm and dry.     Capillary Refill: Capillary refill takes less than 2 seconds.  Neurological:     General: No focal deficit present.     Mental Status: She is alert.  Psychiatric:        Mood and Affect: Mood normal.     ED Results / Procedures / Treatments    Labs (all labs ordered are listed, but only abnormal results are displayed) Labs Reviewed  BASIC METABOLIC PANEL - Abnormal; Notable for the following components:      Result Value   Glucose, Bld 140 (*)    All other components within normal limits  CBC WITH DIFFERENTIAL/PLATELET - Abnormal; Notable for the following components:   WBC 11.7 (*)    RBC 6.27 (*)    HCT 48.3 (*)    MCV 77.0 (*)    MCH 23.6 (*)    RDW 19.8 (*)    All other components within normal limits    EKG None  Radiology MR Brain W and Wo Contrast  Result Date: 06/05/2022 CLINICAL DATA:  Stroke suspected, papilledema EXAM: MRI HEAD AND ORBITS WITHOUT AND WITH CONTRAST TECHNIQUE: Multiplanar, multiecho pulse sequences of the brain and surrounding structures were obtained without and with intravenous contrast. Multiplanar, multiecho pulse sequences of the orbits and surrounding structures were obtained including fat saturation techniques, before and after intravenous contrast administration. CONTRAST:  67mL GADAVIST GADOBUTROL 1 MMOL/ML IV SOLN COMPARISON:  None Available. FINDINGS: Evaluation is limited by motion artifact. MRI HEAD FINDINGS Brain: No restricted diffusion to suggest acute or subacute infarct. No abnormal parenchymal or meningeal enhancement, although the postcontrast sequences particularly motion limited. No acute hemorrhage, mass, mass effect, or midline shift. No hydrocephalus or extra-axial collection. Partial empty sella. Normal craniocervical junction. No hemosiderin deposition to suggest remote hemorrhage. Vascular: Patent arterial flow voids. Skull and upper cervical spine: Normal marrow signal. MRI ORBITS FINDINGS Orbits: Evaluation is limited by motion. Within this limitation, no traumatic or inflammatory finding. Globes, optic nerves, orbital fat, extraocular muscles, vascular structures, and lacrimal glands are normal. Visualized sinuses: Clear. Soft tissues: Negative. IMPRESSION: 1. Evaluation is  limited by motion artifact. Within this limitation, no acute intracranial process or acute orbital abnormality. 2. Partial empty sella, which is nonspecific but can be seen in the setting of idiopathic intracranial hypertension. Evaluation for additional findings related to intracranial hypertension is limited by motion artifact. Correlate with symptoms and consider lumbar puncture with opening pressure if clinically indicated. Electronically Signed   By: Wiliam Ke M.D.   On: 06/05/2022 17:36   MR ORBITS W WO CONTRAST  Result Date: 06/05/2022 CLINICAL DATA:  Stroke suspected, papilledema EXAM: MRI HEAD AND ORBITS WITHOUT AND WITH CONTRAST TECHNIQUE: Multiplanar, multiecho pulse sequences of the brain and surrounding structures were obtained without and with intravenous contrast. Multiplanar, multiecho pulse sequences of the orbits and surrounding structures were obtained including fat saturation techniques, before and after intravenous contrast administration. CONTRAST:  35mL GADAVIST GADOBUTROL 1 MMOL/ML IV SOLN COMPARISON:  None Available. FINDINGS: Evaluation is limited by motion artifact. MRI HEAD FINDINGS Brain: No  restricted diffusion to suggest acute or subacute infarct. No abnormal parenchymal or meningeal enhancement, although the postcontrast sequences particularly motion limited. No acute hemorrhage, mass, mass effect, or midline shift. No hydrocephalus or extra-axial collection. Partial empty sella. Normal craniocervical junction. No hemosiderin deposition to suggest remote hemorrhage. Vascular: Patent arterial flow voids. Skull and upper cervical spine: Normal marrow signal. MRI ORBITS FINDINGS Orbits: Evaluation is limited by motion. Within this limitation, no traumatic or inflammatory finding. Globes, optic nerves, orbital fat, extraocular muscles, vascular structures, and lacrimal glands are normal. Visualized sinuses: Clear. Soft tissues: Negative. IMPRESSION: 1. Evaluation is limited by  motion artifact. Within this limitation, no acute intracranial process or acute orbital abnormality. 2. Partial empty sella, which is nonspecific but can be seen in the setting of idiopathic intracranial hypertension. Evaluation for additional findings related to intracranial hypertension is limited by motion artifact. Correlate with symptoms and consider lumbar puncture with opening pressure if clinically indicated. Electronically Signed   By: Merilyn Baba M.D.   On: 06/05/2022 17:36   CT HEAD WO CONTRAST (5MM)  Result Date: 06/05/2022 CLINICAL DATA:  Headache EXAM: CT HEAD WITHOUT CONTRAST TECHNIQUE: Contiguous axial images were obtained from the base of the skull through the vertex without intravenous contrast. RADIATION DOSE REDUCTION: This exam was performed according to the departmental dose-optimization program which includes automated exposure control, adjustment of the mA and/or kV according to patient size and/or use of iterative reconstruction technique. COMPARISON:  None. FINDINGS: Brain: No evidence of acute infarction, hemorrhage, hydrocephalus, extra-axial collection or mass lesion/mass effect. Vascular: No hyperdense vessel or unexpected calcification. Skull: Normal. Negative for fracture or focal lesion. Sinuses/Orbits: No acute finding. Other: None. IMPRESSION: No acute intracranial abnormality. Electronically Signed   By: Ronney Asters M.D.   On: 06/05/2022 17:28    Procedures Procedures    Medications Ordered in ED Medications  pregabalin (LYRICA) capsule 200 mg (200 mg Oral Given 06/05/22 2107)  acetaminophen (TYLENOL) tablet 1,000 mg (has no administration in time range)  dapagliflozin propanediol (FARXIGA) tablet 5 mg (5 mg Oral Given 06/05/22 2107)  losartan (COZAAR) tablet 100 mg (100 mg Oral Given 06/05/22 2107)  diazepam (VALIUM) injection 5 mg (5 mg Intravenous Given 06/05/22 1538)  haloperidol lactate (HALDOL) injection 2 mg (2 mg Intravenous Given 06/05/22 1538)   gadobutrol (GADAVIST) 1 MMOL/ML injection 10 mL (10 mLs Intravenous Contrast Given 06/05/22 1642)  diazepam (VALIUM) injection 5 mg (5 mg Intravenous Given 06/05/22 1815)  haloperidol lactate (HALDOL) injection 5 mg (5 mg Intravenous Given 06/05/22 1815)    ED Course/ Medical Decision Making/ A&P                             Medical Decision Making Amount and/or Complexity of Data Reviewed Radiology: ordered.  Risk OTC drugs. Prescription drug management.   This patient presents to the ED for concern of papilledema, this involves an extensive number of treatment options, and is a complaint that carries with it a high risk of complications and morbidity.  The differential diagnosis includes increased IIH, intracranial pressure, malignant hypertension, diabetic papillopathy   Co morbidities that complicate the patient evaluation  Developmental delay, poor historian   Additional history obtained:  Additional history obtained from guardian External records from outside source obtained and reviewed including primary care notes showing recent visits for pneumonitis   Lab Tests:  I Ordered, and personally interpreted labs.  The pertinent results include:  WBC 11.7, unremarkable BMP  Imaging Studies ordered:  I ordered imaging studies including MR Brain and orbits, W and W/O contrast, CT head without contrast I independently visualized and interpreted imaging which showed 1. Evaluation is limited by motion artifact. Within this limitation, no acute intracranial process or acute orbital abnormality. 2. Partial empty sella, which is nonspecific but can be seen in the setting of idiopathic intracranial hypertension  No abnormality on CT head without contrast I agree with the radiologist interpretation   Problem List / ED Course / Critical interventions / Medication management   I ordered medication including Valium and Haldol for MRI and LP, anxiety/sedation Reevaluation of  the patient after these medicines showed that the patient improved I have reviewed the patients home medicines and have made adjustments as needed   Social Determinants of Health:  Patient is developmentally delayed, lives with legal guardian   Test / Admission - Considered:  LP attempted at bedside without success.  Plan to keep patient in the emergency department overnight for LP by interventional radiology in the a.m.  IR LP ordered.  Home meds have been ordered.  Patient will be n.p.o. at midnight.  Patient care will be transferred to oncoming provider at shift handoff. Pseudotumor cerebri high on differential. No tumor/mass noted on MRI        Final Clinical Impression(s) / ED Diagnoses Final diagnoses:  Papilledema    Rx / DC Orders ED Discharge Orders     None         Ronny Bacon 06/05/22 2313    Drenda Freeze, MD 06/11/22 [phone]

## 2022-06-06 ENCOUNTER — Emergency Department (HOSPITAL_COMMUNITY): Payer: Medicare HMO

## 2022-06-06 DIAGNOSIS — H4710 Unspecified papilledema: Secondary | ICD-10-CM

## 2022-06-06 DIAGNOSIS — G932 Benign intracranial hypertension: Secondary | ICD-10-CM | POA: Diagnosis not present

## 2022-06-06 LAB — CSF CELL COUNT WITH DIFFERENTIAL
RBC Count, CSF: 0 /mm3
Tube #: 3
WBC, CSF: 1 /mm3 (ref 0–5)

## 2022-06-06 LAB — PROTEIN AND GLUCOSE, CSF
Glucose, CSF: 79 mg/dL — ABNORMAL HIGH (ref 40–70)
Total  Protein, CSF: 28 mg/dL (ref 15–45)

## 2022-06-06 MED ORDER — ACETAZOLAMIDE 250 MG PO TABS
500.0000 mg | ORAL_TABLET | Freq: Two times a day (BID) | ORAL | Status: DC
  Administered 2022-06-06: 500 mg via ORAL
  Filled 2022-06-06 (×2): qty 2

## 2022-06-06 MED ORDER — ACETAZOLAMIDE ER 500 MG PO CP12: 500.0000 mg | ORAL_CAPSULE | Freq: Two times a day (BID) | ORAL | 0 refills | Status: DC

## 2022-06-06 MED ORDER — FOLIC ACID 1 MG PO TABS
1.0000 mg | ORAL_TABLET | Freq: Every day | ORAL | Status: DC
  Administered 2022-06-06: 1 mg via ORAL
  Filled 2022-06-06: qty 1

## 2022-06-06 MED ORDER — LIDOCAINE HCL (PF) 1 % IJ SOLN: 5.0000 mL | Freq: Once | INTRAMUSCULAR | Status: DC

## 2022-06-06 NOTE — Procedures (Signed)
PROCEDURE SUMMARY:  Successful fluoro-guided lumbar puncture at L4-L5.  Opening pressure 24.5cm, 15 mL clear fluid removed.  No immediate complications.  Pt tolerated well.   Specimen was sent for labs.  EBL 0 mL  Docia Barrier PA-C 06/06/2022 10:40 AM

## 2022-06-06 NOTE — ED Provider Notes (Signed)
Care handoff from Cherlynn June PA-C at shift change. Please see their note for further information.  Briefly: Patient developmentally delayed, had routine ophthalmology appointment yesterday that showed papilledema. Family was told to come to the ED for MRI. Does report blurred vision. No headaches or other complaints.   Ddx: IIH, intracranial pressure, malignant hypertension, diabetic papillopathy  Plan: Patient had MRI and CT. MRI concerning for idiopathic intracranial hypertension. LP attempted without success. Plan for IR LP in am with opening pressure to confirm IIH diagnosis. Order has been placed for same.   Upon my evaluation, patient resting well with no complaints.  Care handoff to Benedetto Goad, PA-C at shift change. Please see their note for further evaluation and dispo.   Nestor Lewandowsky 06/06/22 1308    Orpah Greek, MD 06/07/22 [phone]

## 2022-06-06 NOTE — Telephone Encounter (Signed)
Thank you. Unfortunately I do not believe she can get a study in the sleep lab.

## 2022-06-06 NOTE — Discharge Instructions (Addendum)
Imaging was overall reassuring, no evidence of tumor, mass, stroke or bleeding.  Lumbar puncture showed a slight elevation in opening pressure.  This can sometimes suggest increased pressure from extra fluid around the brain which can sometimes lead to headaches and/or vision changes.  Because Regina Wood's opening pressure on her lumbar puncture was borderline elevated it is possible that she may have idiopathic intracranial hypertension (IIH) versus an eye issue leading to her symptoms.  She will need to take prescribed acetazolamide twice daily and she will need to follow-up closely with her ophthalmologist so that he can monitor for improvement in swelling and symptoms.  She will also need to have a follow-up in 1 week with her primary care doctor to recheck electrolytes and kidney function since taking this new medication.  If she develops severe headache, persistent visual changes, fevers, or other new or concerning symptoms she should return to the emergency department.

## 2022-06-06 NOTE — ED Provider Notes (Signed)
Care assumed from [place] at shift change, please see prior documentation from [place] and [place] for full details, but in brief Regina Wood is a 42 y.o. female who was sent here from ophthalmology after she was noted to have papilledema, CT imaging showed empty sella turcica, MRI without lesion or signs of optic neuritis.  Evaluation concerning for idiopathic intracranial hypertension.  LP attempted in the ED last night but unsuccessful, patient held in the ED overnight for LP with fluoroscopy this morning.  If LP shows opening pressure diagnosis of IIH made and will consult neurology for medication recommendations and follow-up.  LP successfully performed by radiology under fluoroscopy.  Opening pressure 24.5.  I consulted neurology and discussed these results with Dr. Cheyenne Adas, opening pressure is borderline, patient has reported visual changes but no headaches, given developmental delay patient is limited historian.  Will initiate acetazolamide 500 mg twice daily per neurology's recommendations.  They also placed outpatient dietitian consult.  Given that patient does not report headaches with this Dr. Lorrin Goodell recommends follow-up with ophthalmology to monitor for improvement in papilledema and further evaluation for ophthalmologic conditions that could be contributing to this given that diagnosis of idiopathic intracranial hypertension is borderline at this time.  Patient will also need to follow-up with her PCP in 1 week for recheck of electrolytes and kidney function after starting medication.  I discussed treatment recommendations and follow-up with patient's mother at bedside at length and she expresses understanding and agreement.  Prescription sent in.  Patient stable for discharge home at this time.   Jacqlyn Larsen, PA-C 06/06/22 1313    Lennice Sites, DO 06/07/22 [phone]

## 2022-06-07 ENCOUNTER — Ambulatory Visit (HOSPITAL_COMMUNITY)
Admission: RE | Admit: 2022-06-07 | Discharge: 2022-06-07 | Disposition: A | Discharge status: Home / Self Care | Payer: Medicare HMO | Source: Ambulatory Visit | Attending: Emergency Medicine | Admitting: Emergency Medicine

## 2022-06-07 DIAGNOSIS — M419 Scoliosis, unspecified: Secondary | ICD-10-CM | POA: Diagnosis not present

## 2022-06-07 DIAGNOSIS — J984 Other disorders of lung: Secondary | ICD-10-CM

## 2022-06-07 DIAGNOSIS — Z8701 Personal history of pneumonia (recurrent): Secondary | ICD-10-CM | POA: Diagnosis not present

## 2022-06-07 DIAGNOSIS — R918 Other nonspecific abnormal finding of lung field: Secondary | ICD-10-CM | POA: Diagnosis not present

## 2022-06-09 LAB — CSF CULTURE W GRAM STAIN
Culture: NO GROWTH
Gram Stain: NONE SEEN

## 2022-06-12 DIAGNOSIS — J9601 Acute respiratory failure with hypoxia: Secondary | ICD-10-CM | POA: Diagnosis not present

## 2022-06-12 DIAGNOSIS — J069 Acute upper respiratory infection, unspecified: Secondary | ICD-10-CM | POA: Diagnosis not present

## 2022-06-12 DIAGNOSIS — R6250 Unspecified lack of expected normal physiological development in childhood: Secondary | ICD-10-CM | POA: Diagnosis not present

## 2022-06-13 ENCOUNTER — Telehealth: Payer: Self-pay

## 2022-06-13 NOTE — Telephone Encounter (Signed)
        Patient  visited [facility] on 2/1    Telephone encounter attempt :  1st  A HIPAA compliant voice message was left requesting a return call.  Instructed patient to call back    Mukhtar Shams [facility] Guide, [place] [phone] [address], [place],  [address] Phone: [phone] Email: [email or web address]       

## 2022-06-14 ENCOUNTER — Telehealth: Payer: Self-pay

## 2022-06-14 NOTE — Telephone Encounter (Signed)
     Patient  visit on  2/1 at [facility]   Have you been able to follow up with your primary care physician? Yes   The patient was or was not able to obtain any needed medicine or equipment. Yes   Are there diet recommendations that you are having difficulty following? Na   Patient expresses understanding of discharge instructions and education provided has no other needs at this time.  Yes      Regina Wood [facility] Guide, [place] [phone] [address], [place], [place] [address] Phone: [phone] Email: [email or web address]    

## 2022-06-18 DIAGNOSIS — R6250 Unspecified lack of expected normal physiological development in childhood: Secondary | ICD-10-CM | POA: Diagnosis not present

## 2022-06-18 DIAGNOSIS — Z9981 Dependence on supplemental oxygen: Secondary | ICD-10-CM | POA: Diagnosis not present

## 2022-06-18 DIAGNOSIS — M419 Scoliosis, unspecified: Secondary | ICD-10-CM | POA: Diagnosis not present

## 2022-06-18 DIAGNOSIS — K760 Fatty (change of) liver, not elsewhere classified: Secondary | ICD-10-CM | POA: Diagnosis not present

## 2022-06-18 DIAGNOSIS — Z6841 Body Mass Index (BMI) 40.0 and over, adult: Secondary | ICD-10-CM | POA: Diagnosis not present

## 2022-06-18 DIAGNOSIS — I110 Hypertensive heart disease with heart failure: Secondary | ICD-10-CM | POA: Diagnosis not present

## 2022-06-18 DIAGNOSIS — Z7984 Long term (current) use of oral hypoglycemic drugs: Secondary | ICD-10-CM | POA: Diagnosis not present

## 2022-06-18 DIAGNOSIS — G890 Central pain syndrome: Secondary | ICD-10-CM | POA: Diagnosis not present

## 2022-06-18 DIAGNOSIS — E119 Type 2 diabetes mellitus without complications: Secondary | ICD-10-CM | POA: Diagnosis not present

## 2022-06-18 DIAGNOSIS — H4710 Unspecified papilledema: Secondary | ICD-10-CM | POA: Diagnosis not present

## 2022-06-18 DIAGNOSIS — J9601 Acute respiratory failure with hypoxia: Secondary | ICD-10-CM | POA: Diagnosis not present

## 2022-06-18 DIAGNOSIS — J069 Acute upper respiratory infection, unspecified: Secondary | ICD-10-CM | POA: Diagnosis not present

## 2022-06-18 DIAGNOSIS — I509 Heart failure, unspecified: Secondary | ICD-10-CM | POA: Diagnosis not present

## 2022-06-18 DIAGNOSIS — Z9181 History of falling: Secondary | ICD-10-CM | POA: Diagnosis not present

## 2022-06-18 DIAGNOSIS — I10 Essential (primary) hypertension: Secondary | ICD-10-CM | POA: Diagnosis not present

## 2022-06-18 DIAGNOSIS — R69 Illness, unspecified: Secondary | ICD-10-CM | POA: Diagnosis not present

## 2022-06-18 DIAGNOSIS — E1121 Type 2 diabetes mellitus with diabetic nephropathy: Secondary | ICD-10-CM | POA: Diagnosis not present

## 2022-06-18 DIAGNOSIS — M549 Dorsalgia, unspecified: Secondary | ICD-10-CM | POA: Diagnosis not present

## 2022-06-20 ENCOUNTER — Ambulatory Visit (INDEPENDENT_AMBULATORY_CARE_PROVIDER_SITE_OTHER): Payer: Medicare HMO | Admitting: Emergency Medicine

## 2022-06-20 ENCOUNTER — Encounter: Payer: Self-pay | Admitting: Emergency Medicine

## 2022-06-20 VITALS — BP 120/80 | HR 95 | Ht 60.0 in | Wt 261.0 lb

## 2022-06-20 DIAGNOSIS — R29818 Other symptoms and signs involving the nervous system: Secondary | ICD-10-CM

## 2022-06-20 DIAGNOSIS — J984 Other disorders of lung: Secondary | ICD-10-CM

## 2022-06-20 DIAGNOSIS — R9389 Abnormal findings on diagnostic imaging of other specified body structures: Secondary | ICD-10-CM

## 2022-06-20 NOTE — Progress Notes (Signed)
   Subjective:    Patient ID: Regina Wood, female    DOB: 1981/04/15, 42 y.o.   MRN: [id number]  HPI  ROV 05/21/2022 --follow-up visit 42 year old woman with history of obesity, diabetes, hypertension, developmental delay.  She is here with her mom.  She has history of a mosaic pattern on CT scan of the chest, question pneumonitis versus air trapping.  Hospitalized in November with hypoxemic respiratory failure, suspected viral pneumonia.  She was discharged on oxygen 3 L/min.  She is here today for a walking oxygen titration on pulsed flow so she can get a POC.  We are working on arranging for home sleep study.  At her last visit I tried restarting her on empiric Dulera given suspicion for possible obstructive lung disease. She and her mom are unsure whether the Ruthe Mannan has made any impact.   ROV 06/20/22 --42 year old woman with obesity, diabetes, hypertension and developmental delay.  She has a history suspected obstructive lung disease and asthma.  She has areas bilateral groundglass consistent with either pneumonitis or air trapping.  We tried her on Dulera but did not get much interval improvement.  We tried stopping it in mid January to see if she would miss it.  CT scan of the chest performed 06/07/2022 reviewed by me, shows interval improvement in the aeration with decreased convex opacity, slight nonspecific mosaic pattern that persists, question chronic inflammation or small airways disease.  No pulmonary nodules.    Vitals:   06/20/22 1059  BP: 120/80  Pulse: 95  SpO2: 97%  Weight: 261 lb (118.4 kg)  Height: 5' (1.524 m)  Gen: Pleasant, obese woman, in no distress   ENT: No lesions,  mouth clear,  oropharynx clear, no postnasal drip   Neck: No JVD, no stridor   Lungs: No use of accessory muscles, very decreased at both bases, distant, no crackles or wheezing on normal respiration   Cardiovascular: RRR, heart sounds normal, no murmur or gallops, no peripheral edema    Musculoskeletal: No deformities, no cyanosis or clubbing   Neuro: alert, awake, developmental delay and depends on her parents to answer most questions.    Skin: Warm, no lesions or rash  Abnormal CT of the chest Scattered patchy groundglass infiltrates consistent with either air trapping or underlying pneumonitis.  Improved on her most recent CT 06/07/2022 but I can still see some very subtle groundglass.  We tried her empirically on Dulera given the concern for possible trapping and obstructive lung disease.  She did not get any significant response.  She does still have albuterol to use if needed.  We will plan to repeat her CT scan of the chest in August 2024 to ensure stability.  If there is any evidence of recurrent pneumonitis, progression she may require further diagnostic workup including bronchoscopy.   Suspected sleep apnea Her home sleep test did not give good data.  She cannot get a sleep study in the lab.  For now we will hold off on a repeat sleep study, continue her oxygen 2 L/min at night.   Baltazar Apo, MD, PhD 06/24/2022, 2:21 PM [facility] [phone] or if no answer [phone]

## 2022-06-20 NOTE — Patient Instructions (Signed)
We reviewed your CT scan of the chest today. We will plan to repeat your CT without contrast in August 2024 to ensure stability. We will not restart Dulera at this time Keep albuterol available to use 2 puffs if needed for shortness of breath, chest tightness, wheezing. We will hold off on a repeat sleep study for now Continue your oxygen at 2 L/min at night while sleeping Follow with Dr. Lamonte Sakai in August after your CT scan so we can review the results together.

## 2022-06-24 NOTE — Assessment & Plan Note (Signed)
Her home sleep test did not give good data.  She cannot get a sleep study in the lab.  For now we will hold off on a repeat sleep study, continue her oxygen 2 L/min at night.

## 2022-06-24 NOTE — Assessment & Plan Note (Signed)
Scattered patchy groundglass infiltrates consistent with either air trapping or underlying pneumonitis.  Improved on her most recent CT 06/07/2022 but I can still see some very subtle groundglass.  We tried her empirically on Dulera given the concern for possible trapping and obstructive lung disease.  She did not get any significant response.  She does still have albuterol to use if needed.  We will plan to repeat her CT scan of the chest in August 2024 to ensure stability.  If there is any evidence of recurrent pneumonitis, progression she may require further diagnostic workup including bronchoscopy.

## 2022-06-28 DIAGNOSIS — E119 Type 2 diabetes mellitus without complications: Secondary | ICD-10-CM | POA: Diagnosis not present

## 2022-06-28 DIAGNOSIS — H40033 Anatomical narrow angle, bilateral: Secondary | ICD-10-CM | POA: Diagnosis not present

## 2022-06-28 DIAGNOSIS — H4710 Unspecified papilledema: Secondary | ICD-10-CM | POA: Diagnosis not present

## 2022-06-28 DIAGNOSIS — H04123 Dry eye syndrome of bilateral lacrimal glands: Secondary | ICD-10-CM | POA: Diagnosis not present

## 2022-06-30 ENCOUNTER — Other Ambulatory Visit: Payer: Self-pay | Admitting: Neurology

## 2022-06-30 DIAGNOSIS — G629 Polyneuropathy, unspecified: Secondary | ICD-10-CM

## 2022-07-02 DIAGNOSIS — M4120 Other idiopathic scoliosis, site unspecified: Secondary | ICD-10-CM | POA: Diagnosis not present

## 2022-07-04 DIAGNOSIS — I110 Hypertensive heart disease with heart failure: Secondary | ICD-10-CM | POA: Diagnosis not present

## 2022-07-04 DIAGNOSIS — Z6841 Body Mass Index (BMI) 40.0 and over, adult: Secondary | ICD-10-CM | POA: Diagnosis not present

## 2022-07-04 DIAGNOSIS — J069 Acute upper respiratory infection, unspecified: Secondary | ICD-10-CM | POA: Diagnosis not present

## 2022-07-04 DIAGNOSIS — E119 Type 2 diabetes mellitus without complications: Secondary | ICD-10-CM | POA: Diagnosis not present

## 2022-07-04 DIAGNOSIS — J9601 Acute respiratory failure with hypoxia: Secondary | ICD-10-CM | POA: Diagnosis not present

## 2022-07-04 DIAGNOSIS — Z7984 Long term (current) use of oral hypoglycemic drugs: Secondary | ICD-10-CM | POA: Diagnosis not present

## 2022-07-04 DIAGNOSIS — M419 Scoliosis, unspecified: Secondary | ICD-10-CM | POA: Diagnosis not present

## 2022-07-04 DIAGNOSIS — G890 Central pain syndrome: Secondary | ICD-10-CM | POA: Diagnosis not present

## 2022-07-04 DIAGNOSIS — I509 Heart failure, unspecified: Secondary | ICD-10-CM | POA: Diagnosis not present

## 2022-07-04 DIAGNOSIS — Z9981 Dependence on supplemental oxygen: Secondary | ICD-10-CM | POA: Diagnosis not present

## 2022-07-04 DIAGNOSIS — Z9181 History of falling: Secondary | ICD-10-CM | POA: Diagnosis not present

## 2022-07-04 DIAGNOSIS — K760 Fatty (change of) liver, not elsewhere classified: Secondary | ICD-10-CM | POA: Diagnosis not present

## 2022-07-04 DIAGNOSIS — R6250 Unspecified lack of expected normal physiological development in childhood: Secondary | ICD-10-CM | POA: Diagnosis not present

## 2022-07-04 DIAGNOSIS — R69 Illness, unspecified: Secondary | ICD-10-CM | POA: Diagnosis not present

## 2022-07-04 DIAGNOSIS — M549 Dorsalgia, unspecified: Secondary | ICD-10-CM | POA: Diagnosis not present

## 2022-07-05 DIAGNOSIS — J9601 Acute respiratory failure with hypoxia: Secondary | ICD-10-CM | POA: Diagnosis not present

## 2022-07-09 NOTE — Telephone Encounter (Signed)
It was sent yesterday at 607 pm to   Have her call the pharmacy directly to get it filled.

## 2022-07-09 NOTE — Telephone Encounter (Signed)
Pt mother called wanting to know when medication pregabalin (LYRICA) 200 MG capsule  will be sent to the pharmacy.

## 2022-07-11 DIAGNOSIS — M4186 Other forms of scoliosis, lumbar region: Secondary | ICD-10-CM | POA: Diagnosis not present

## 2022-07-11 DIAGNOSIS — J069 Acute upper respiratory infection, unspecified: Secondary | ICD-10-CM | POA: Diagnosis not present

## 2022-07-11 DIAGNOSIS — J9601 Acute respiratory failure with hypoxia: Secondary | ICD-10-CM | POA: Diagnosis not present

## 2022-07-11 DIAGNOSIS — R809 Proteinuria, unspecified: Secondary | ICD-10-CM | POA: Diagnosis not present

## 2022-07-11 DIAGNOSIS — Z6841 Body Mass Index (BMI) 40.0 and over, adult: Secondary | ICD-10-CM | POA: Diagnosis not present

## 2022-07-11 DIAGNOSIS — J9611 Chronic respiratory failure with hypoxia: Secondary | ICD-10-CM | POA: Diagnosis not present

## 2022-07-11 DIAGNOSIS — I10 Essential (primary) hypertension: Secondary | ICD-10-CM | POA: Diagnosis not present

## 2022-07-11 DIAGNOSIS — E1121 Type 2 diabetes mellitus with diabetic nephropathy: Secondary | ICD-10-CM | POA: Diagnosis not present

## 2022-07-11 DIAGNOSIS — H4710 Unspecified papilledema: Secondary | ICD-10-CM | POA: Diagnosis not present

## 2022-07-11 DIAGNOSIS — R6250 Unspecified lack of expected normal physiological development in childhood: Secondary | ICD-10-CM | POA: Diagnosis not present

## 2022-07-15 ENCOUNTER — Ambulatory Visit: Payer: Self-pay

## 2022-07-15 NOTE — Patient Outreach (Signed)
  Care Coordination   07/15/2022 Name: Regina Wood MRN: [id number] DOB: October 07, 1980   Care Coordination Outreach Attempts:  An unsuccessful telephone outreach was attempted for a scheduled appointment today.  Follow Up Plan:  Additional outreach attempts will be made to offer the patient care coordination information and services.   Encounter Outcome:  No Answer   Care Coordination Interventions:  No, not indicated    Barb Merino, RN, BSN, CCM Care Management Coordinator [facility] Care Management  Direct Phone: [phone]

## 2022-07-16 ENCOUNTER — Telehealth: Payer: Self-pay | Admitting: Neurology

## 2022-07-16 NOTE — Telephone Encounter (Signed)
The pt's mother was called, the message from RN was relayed to her.  No call back requested

## 2022-07-16 NOTE — Telephone Encounter (Signed)
Pt's mother called stating that they have not received a call back about the Rx for the Gabapentin that was increased in the last OV. Pt would [facility] [address]. Please advise.

## 2022-07-16 NOTE — Telephone Encounter (Signed)
There is a refill note in the chart. Patient was not on Gabapentin. Lyrica is what was increased at the last visit. [facility] [address] on [facility] at 6 pm. Please advise the mother to call the pharmacy back and ask for the Pregabalin (Lyrica) prescription.

## 2022-08-07 ENCOUNTER — Ambulatory Visit: Payer: Self-pay

## 2022-08-07 NOTE — Patient Outreach (Signed)
  Care Coordination   08/07/2022 Name: Regina Wood MRN: [id number] DOB: Oct 28, 1980   Care Coordination Outreach Attempts:  An unsuccessful telephone outreach was attempted for a scheduled appointment today.  Follow Up Plan:  Additional outreach attempts will be made to offer the patient care coordination information and services.   Encounter Outcome:  No Answer   Care Coordination Interventions:  No, not indicated    Barb Merino, RN, BSN, CCM Care Management Coordinator [facility] Care Management  Direct Phone: [phone]

## 2022-08-11 DIAGNOSIS — J069 Acute upper respiratory infection, unspecified: Secondary | ICD-10-CM | POA: Diagnosis not present

## 2022-08-11 DIAGNOSIS — R6250 Unspecified lack of expected normal physiological development in childhood: Secondary | ICD-10-CM | POA: Diagnosis not present

## 2022-08-11 DIAGNOSIS — J9601 Acute respiratory failure with hypoxia: Secondary | ICD-10-CM | POA: Diagnosis not present

## 2022-08-12 DIAGNOSIS — H4710 Unspecified papilledema: Secondary | ICD-10-CM | POA: Diagnosis not present

## 2022-08-12 DIAGNOSIS — H40033 Anatomical narrow angle, bilateral: Secondary | ICD-10-CM | POA: Diagnosis not present

## 2022-08-12 DIAGNOSIS — E119 Type 2 diabetes mellitus without complications: Secondary | ICD-10-CM | POA: Diagnosis not present

## 2022-08-12 DIAGNOSIS — H04123 Dry eye syndrome of bilateral lacrimal glands: Secondary | ICD-10-CM | POA: Diagnosis not present

## 2022-08-22 DIAGNOSIS — J9601 Acute respiratory failure with hypoxia: Secondary | ICD-10-CM | POA: Diagnosis not present

## 2022-09-04 NOTE — Therapy (Signed)
OUTPATIENT PHYSICAL THERAPY THORACOLUMBAR EVALUATION   Patient Name: Regina Wood MRN: [id number] DOB:Mar 18, 1981, 42 y.o., female Today's Date: 09/05/2022  END OF SESSION:  PT End of Session - 09/05/22 1517     Visit Number 1    Number of Visits 12    Date for PT Re-Evaluation 10/18/22    Authorization Type MCR/MCD    PT Start Time 1415    PT Stop Time 1500    PT Time Calculation (min) 45 min    Activity Tolerance Patient limited by pain    Behavior During Therapy [facility] for tasks assessed/performed             Past Medical History:  Diagnosis Date   Diabetes mellitus without complication (HCC)    Obesity    Pneumonia    History reviewed. No pertinent surgical history. Patient Active Problem List   Diagnosis Date Noted   Suspected sleep apnea 04/19/2022   Upper respiratory infection, acute 03/08/2022   Chronic respiratory failure (HCC) 03/07/2022   Abnormal CT of the chest 12/31/2019   Acute respiratory failure with hypoxia (HCC) 04/21/2019   Pneumonia 04/21/2019   Hyperglycemia 04/21/2019   HTN (hypertension) 04/21/2019   Developmental delay 04/21/2019    PCP: Melida Quitter, MD   REFERRING PROVIDER: Melina Fiddler, MD   REFERRING DIAG: back pain ,Severe scoliosis>50%   Rationale for Evaluation and Treatment: Rehabilitation  THERAPY DIAG:  Chronic right-sided low back pain without sciatica - Plan: PT plan of care cert/re-cert  Muscle weakness (generalized) - Plan: PT plan of care cert/re-cert  Abnormal posture - Plan: PT plan of care cert/re-cert  ONSET DATE: about 6 months ago.  Pt is accompanied by mother, Regina Wood  SUBJECTIVE:                                                                                                                                                                                           SUBJECTIVE STATEMENT: Mother will accompany patient to PT due to pt develomental delay. I have back pain and both my knees get really  tired.  Sometimes both my feet go to sleep.  (Neuropathy according to mother and with carpal tunnel. sleeping with O2 until a week ago and mother states she is breathing well and discontinued.  Pt states it is hard to get up from the  toilet.  It is hard for me to walk in between rooms.  I feel like a lot of spasms.  I have the most problems waking up in the morning getting out of bed.  Mother states her daughter was in hospital in February and fell  as she was getting out of bed to leave hospital.  PERTINENT HISTORY:  Carpal Tunnel,  DM, obesity, Developmental disorder,hyperglycemia  See medical chart  PAIN: Using pictures for pt to point to Are you having pain? Yes: NPRS scale: at rest 8/10 and at worst 10/10 Pain location: R low back over muscular Quadratus lumborum Pain description: "pain" unable to describe Aggravating factors: when waking up in the morningis the worst time and when she walks for a long time more than 5-10 minutes Relieving factors: nothing  PRECAUTIONS: None  WEIGHT BEARING RESTRICTIONS: No  FALLS:  Has patient fallen in last 6 months? Yes. 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   PLOF:  Pt developmentally impaired and is able to walk independently  PATIENT GOALS: want back to feel better  NEXT MD VISIT: TBD  OBJECTIVE:   DIAGNOSTIC FINDINGS:  No recent xrays of spine  PATIENT SURVEYS:  FOTO Not taken due to developmental disabilty  SCREENING FOR RED FLAGS: Bowel or bladder incontinence: No  COGNITION: Overall cognitive status: History of cognitive impairments - at baseline     SENSATION: Complains about both haands and feet go to  sleep  MUSCLE LENGTH: Hamstrings: Right 50 deg; Left 54 deg Thomas test: Bil tightness  POSTURE: rounded shoulders, forward head, and left pelvic obliquity  PALPATION: TTP over R Quadratus lumborum but not over spine, some tenderness over iliac crest and over R lower ribs and QL insertion  LUMBAR ROM:   AROM eval  Flexion Fingertips to mid shin p! Comingup to stand  Extension WNL but with p!  Right lateral flexion WNL but with p!  Left lateral flexion WNL  Right rotation WNL p!  Left rotation WNL   (Blank rows = not tested)  LOWER EXTREMITY ROM:     Active  Right eval Left eval  Hip flexion 60 60  Hip extension    Hip abduction    Hip adduction    Hip internal rotation    Hip external rotation    Knee flexion 110 110  Knee extension 0 0  Ankle dorsiflexion    Ankle plantarflexion    Ankle inversion    Ankle eversion     (Blank rows = not tested)  LOWER EXTREMITY MMT:  grossly 4/5  MMT Right eval Left eval  Hip flexion    Hip extension    Hip abduction    Hip adduction    Hip internal rotation    Hip external rotation    Knee flexion    Knee extension    Ankle dorsiflexion    Ankle plantarflexion 13/25 15/25  Ankle inversion    Ankle eversion     (Blank rows = not tested)    FUNCTIONAL TESTS:  5 times sit to stand: 13.88 sec 6 minute walk test: TBD  GAIT: Distance walked: 150 Assistive device utilized: None Level of assistance: SBA by mother Comments: Mother reports that she cannot walk for more than 5 minutes.    TODAY'S TREATMENT:  DATE: EVAL  and Issue HEP    PATIENT EDUCATION:  Education details: POC  Issue HEP  Explanation of findings to mother/pt Person educated: Patient and Parent Education method: Explanation, Demonstration, Tactile cues, Verbal cues, and Handouts Education comprehension: verbalized  understanding, returned demonstration, verbal cues required, tactile cues required, and needs further education  HOME EXERCISE PROGRAM:  Access Code: [email or web address] URL: [email or web address] Date: 09/05/2022 Prepared by: Garen Lah  Program Notes [name] needs to get up every 1-2 hours to walk for at least 10 minutes   Do not remain sitting for hours  Exercises - Sit to stand with sink support Movement snack  - 1 x daily - 7 x weekly - 3 sets - 10 reps - Standing 'L' Stretch at Counter  - 1 x daily - 7 x weekly - 1 sets - 5-10 reps - 10 sec hold - Supine Single knee to Chest Stretch with Towel  - 1 x daily - 7 x weekly - 3 sets - 10 reps - Supine Lower Trunk Rotation  - 1 x daily - 7 x weekly - 1 sets - 5 reps - 20 sec hold ASSESSMENT:  CLINICAL IMPRESSION: Patient is a 42 y.o. female Accompanied by mother who was seen today for physical therapy evaluation and treatment for low back pain without radiculopathy and Dextroscoliosis  and about 6 months of worsening back pain post cortisone injections.  Pt /mother reports a sedentary lifestyle and sits for most of day.  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 She does lift boxes and distributes food.  Pt will benefit from skilled PT to address strength deficits and ameliorate pain symptoms. Mother reports that pt has most pain rising from bed in the morning.  She also reports one fall when hospitalized  for respiratory problems in Feb 2024 as she was getting out of bed.  [name] likes to sleep on edge of her bed and sleep patterns may be contributing to her ongoing back pain.     OBJECTIVE IMPAIRMENTS: decreased activity tolerance, difficulty walking, decreased ROM, decreased strength, improper body mechanics, postural dysfunction, obesity, and pain.   ACTIVITY LIMITATIONS: lifting, bending, standing, squatting, stairs, and transfers  PARTICIPATION LIMITATIONS:  job  that [employer] developmental disabilities and    PERSONAL FACTORS: Carpal Tunnel,  DM, obesity, Developmental disorder,hyperglycemia  See medical chart are also affecting patient's functional outcome.   REHAB POTENTIAL: Fair Pt developmentally disabled and will depend on mother for consistency and compliance  CLINICAL DECISION MAKING: Evolving/moderate complexity  EVALUATION COMPLEXITY: Moderate   GOALS: Goals reviewed with patient? Yes  SHORT TERM GOALS: Target date: 09-26-22  Mother will be able to assist daughter with HEP Baseline:no knowledge Goal status: INITIAL  2.  Report pain decrease from10 /10 to6 /10. At rest Baseline:  Goal status: INITIAL  3.  Demonstrate understanding of neutral posture and be more conscious of position and posture throughout the day.  Baseline:  Goal status: INITIAL  4.  LONG TERM GOALS: Target date: 10-18-22  Parent will be able to assist pt  with advanced HEP.  Baseline: no knowledge Goal status: INITIAL  2.  Demonstrate and verbalize techniques to reduce the risk of re-injury including: lifting, posture, body mechanics. Demonstrate with verbal assistance Baseline: no knowledge Goal status: INITIAL  3. Report a 50% reduction with pain with home and work tasks Baseline: 10/10 Goal status: INITIAL  4.  .  Pt will be educated  and begin walking program 3-5 x a week for 20 minutes with 50% reduced pain Baseline: not walking more than 5-10 min and sits for most of day Goal status: INITIAL     PLAN:  PT FREQUENCY: 1-2x/week  PT DURATION: 6 weeks  PLANNED INTERVENTIONS: Therapeutic exercises, Therapeutic activity, Neuromuscular re-education, Balance training, Gait training, Patient/Family education, Self Care, Joint mobilization, Stair training, Dry Needling, Cryotherapy, Moist heat, Taping, Manual therapy, and Re-evaluation.  PLAN FOR NEXT SESSION: Review HEP   Garen Lah, PT, [facility] Certified Exercise Expert for the Aging  Adult  09/05/22 4:08 PM Phone: [phone] Fax: [phone]

## 2022-09-05 ENCOUNTER — Other Ambulatory Visit: Payer: Self-pay

## 2022-09-05 ENCOUNTER — Encounter: Payer: Self-pay | Admitting: Physical Therapy

## 2022-09-05 ENCOUNTER — Ambulatory Visit: Payer: Medicare HMO | Attending: Sports Medicine | Admitting: Physical Therapy

## 2022-09-05 DIAGNOSIS — M545 Low back pain, unspecified: Secondary | ICD-10-CM | POA: Diagnosis not present

## 2022-09-05 DIAGNOSIS — G8929 Other chronic pain: Secondary | ICD-10-CM | POA: Insufficient documentation

## 2022-09-05 DIAGNOSIS — M6281 Muscle weakness (generalized): Secondary | ICD-10-CM | POA: Diagnosis not present

## 2022-09-05 DIAGNOSIS — R293 Abnormal posture: Secondary | ICD-10-CM

## 2022-09-05 NOTE — Patient Instructions (Signed)
Access Code: [email or web address] URL: [email or web address] Date: 09/05/2022 Prepared by: Garen Lah  Exercises - Sit to stand with sink support Movement snack  - 1 x daily - 7 x weekly - 3 sets - 10 reps - Standing 'L' Stretch at Counter  - 1 x daily - 7 x weekly - 1 sets - 5-10 reps - 10 sec hold - Supine Single knee to Chest Stretch with Towel  - 1 x daily - 7 x weekly - 3 sets - 10 reps - Supine Lower Trunk Rotation  - 1 x daily - 7 x weekly - 1 sets - 5 reps - 20 sec hold

## 2022-09-10 DIAGNOSIS — R6250 Unspecified lack of expected normal physiological development in childhood: Secondary | ICD-10-CM | POA: Diagnosis not present

## 2022-09-10 DIAGNOSIS — J9601 Acute respiratory failure with hypoxia: Secondary | ICD-10-CM | POA: Diagnosis not present

## 2022-09-10 DIAGNOSIS — J069 Acute upper respiratory infection, unspecified: Secondary | ICD-10-CM | POA: Diagnosis not present

## 2022-09-11 ENCOUNTER — Ambulatory Visit: Payer: Self-pay

## 2022-09-11 NOTE — Patient Outreach (Signed)
  Care Coordination   09/11/2022 Name: Regina Wood MRN: [id number] DOB: 1980/07/26   Care Coordination Outreach Attempts:  An unsuccessful telephone outreach was attempted for a scheduled appointment today.  Follow Up Plan:  No further outreach attempts will be made at this time. We have been unable to contact the patient to assess for RN CC goal outcomes.   Encounter Outcome:  No Answer   Care Coordination Interventions:  No, not indicated    Delsa Sale, RN, BSN, CCM Care Management Coordinator [facility] Care Management  Direct Phone: [phone]

## 2022-09-21 DIAGNOSIS — J9601 Acute respiratory failure with hypoxia: Secondary | ICD-10-CM | POA: Diagnosis not present

## 2022-09-24 ENCOUNTER — Encounter: Payer: Self-pay | Admitting: Physical Therapy

## 2022-09-24 ENCOUNTER — Ambulatory Visit: Payer: Medicare HMO | Admitting: Physical Therapy

## 2022-09-24 DIAGNOSIS — M6281 Muscle weakness (generalized): Secondary | ICD-10-CM | POA: Diagnosis not present

## 2022-09-24 DIAGNOSIS — M545 Low back pain, unspecified: Secondary | ICD-10-CM

## 2022-09-24 DIAGNOSIS — R293 Abnormal posture: Secondary | ICD-10-CM | POA: Diagnosis not present

## 2022-09-24 DIAGNOSIS — G8929 Other chronic pain: Secondary | ICD-10-CM | POA: Diagnosis not present

## 2022-09-24 NOTE — Therapy (Signed)
OUTPATIENT PHYSICAL THERAPY THORACOLUMBAR EVALUATION   Patient Name: Regina Wood MRN: [id number] DOB:[birth year], 42 y.o., female Today's Date: 09/24/2022  END OF SESSION:  PT End of Session - 09/24/22 1332     Visit Number 2    Number of Visits 12    Date for PT Re-Evaluation 10/18/22    Authorization Type MCR/MCD    PT Start Time 1330    PT Stop Time 1415    PT Time Calculation (min) 45 min    Activity Tolerance Patient limited by pain    Behavior During Therapy [facility] for tasks assessed/performed             Past Medical History:  Diagnosis Date   Diabetes mellitus without complication (HCC)    Obesity    Pneumonia    History reviewed. No pertinent surgical history. Patient Active Problem List   Diagnosis Date Noted   Suspected sleep apnea 04/19/2022   Upper respiratory infection, acute 03/08/2022   Chronic respiratory failure (HCC) 03/07/2022   Abnormal CT of the chest 12/31/2019   Acute respiratory failure with hypoxia (HCC) 04/21/2019   Pneumonia 04/21/2019   Hyperglycemia 04/21/2019   HTN (hypertension) 04/21/2019   Developmental delay 04/21/2019    PCP: Melida Quitter, MD   REFERRING PROVIDER: Melina Fiddler, MD   REFERRING DIAG: back pain ,Severe scoliosis>50%   Rationale for Evaluation and Treatment: Rehabilitation  THERAPY DIAG:  No diagnosis found.  ONSET DATE: about 6 months ago.  Pt is accompanied by mother, Armanii Fietz  SUBJECTIVE:                                                                                                                                                                                           SUBJECTIVE STATEMENT:Mother is present with special needs daughter.Marland Kitchen  Pt/dtr unable to indicated exact number scale.   My back and feet hurt.    EVAL- Mother will accompany patient to PT due to pt develomental delay. I have back pain and both my knees get really tired.  Sometimes both my feet go to sleep.  (Neuropathy  according to mother and with carpal tunnel. sleeping with O2 until a week ago and mother states she is breathing well and discontinued.  Pt states it is hard to get up from the  toilet.  It is hard for me to walk in between rooms.  I feel like a lot of spasms.  I have the most problems waking up in the morning getting out of bed.  Mother states her daughter was in hospital in February and fell as she was getting out of bed  to leave hospital.  PERTINENT HISTORY:  Carpal Tunnel,  DM, obesity, Developmental disorder,hyperglycemia  See medical chart  PAIN: Using pictures for pt to point to Are you having pain? Yes: NPRS scale: at rest 8/10 and at worst 10/10 Pain location: R low back over muscular Quadratus lumborum Pain description: "pain" unable to describe Aggravating factors: when waking up in the morningis the worst time and when she walks for a long time more than 5-10 minutes Relieving factors: nothing  PRECAUTIONS: None  WEIGHT BEARING RESTRICTIONS: No  FALLS:  Has patient fallen in last 6 months? Yes. 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   PLOF:  Pt developmentally impaired and is able to walk independently  PATIENT GOALS: want back to feel better  NEXT MD VISIT: TBD  OBJECTIVE:   DIAGNOSTIC FINDINGS:  No recent xrays of spine  PATIENT SURVEYS:  FOTO Not taken due to developmental disabilty  SCREENING FOR RED FLAGS: Bowel or bladder incontinence: No  COGNITION: Overall cognitive status: History of cognitive impairments - at baseline     SENSATION: Complains about both haands and feet go to sleep  MUSCLE LENGTH: Hamstrings: Right 50 deg; Left 54  deg Thomas test: Bil tightness  POSTURE: rounded shoulders, forward head, and left pelvic obliquity  PALPATION: TTP over R Quadratus lumborum but not over spine, some tenderness over iliac crest and over R lower ribs and QL insertion  LUMBAR ROM:   AROM eval  Flexion Fingertips to mid shin p! Comingup to stand  Extension WNL but with p!  Right lateral flexion WNL but with p!  Left lateral flexion WNL  Right rotation WNL p!  Left rotation WNL   (Blank rows = not tested)  LOWER EXTREMITY ROM:     Active  Right eval Left eval  Hip flexion 60 60  Hip extension    Hip abduction    Hip adduction    Hip internal rotation    Hip external rotation    Knee flexion 110 110  Knee extension 0 0  Ankle dorsiflexion    Ankle plantarflexion    Ankle inversion    Ankle eversion     (Blank rows = not tested)  LOWER EXTREMITY MMT:  grossly 4/5  MMT Right eval Left eval  Hip flexion    Hip extension    Hip abduction    Hip adduction    Hip internal rotation    Hip external rotation    Knee flexion    Knee extension    Ankle dorsiflexion    Ankle plantarflexion 13/25 15/25  Ankle inversion    Ankle eversion     (Blank rows = not tested)    FUNCTIONAL TESTS:  5 times sit to stand: 13.88 sec 6 minute walk test: TBD    09-24-22  1166.3 ft  GAIT: Distance walked: 150 Assistive device utilized: None Level of assistance: SBA by mother Comments: Mother reports that she cannot walk for more than 5 minutes.    TODAY'S TREATMENT:       [facility] PT Treatment:  DATE: 09-24-22 1163.3 ft Therapeutic Exercise: Sit to stand with sink support Movement snack  1 x 10 , 2 x 10 with 10 lb   Standing 'L' Stretch at [employer] with elbows works better.  3 x 10-20 sec hold as needed with walking Supine Single knee to Chest Stretch with Towel  - 1 x daily - 7 x weekly - 3 sets - 10 reps Supine Lower Trunk Rotation  5 reps - 20 sec  hold Cat Cow to Child's Pose  2 sets - 10 reps  Self Care: Walking program and discussed with mother on how to implement.  Also discussed benefits of aquatics since she was a member at [facility] and let lapse in order to return to a swimming class for exercise                                                                                                                         DATE: EVAL  and Issue HEP    PATIENT EDUCATION:  Education details: POC  Issue HEP  Explanation of findings to mother/pt Person educated: Patient and Parent Education method: Explanation, Demonstration, Tactile cues, Verbal cues, and Handouts Education comprehension: verbalized understanding, returned demonstration, verbal cues required, tactile cues required, and needs further education  HOME EXERCISE PROGRAM:  Access Code: [email or web address] URL: [email or web address] Date: 09/24/2022 Prepared by: Garen Lah  Program Notes [name] needs to get up every 1-2 hours to walk for at least 10 minutes   Do not remain sitting for hours  Exercises - Sit to stand with sink support Movement snack  - 1 x daily - 7 x weekly - 3 sets - 10 reps - Standing 'L' Stretch at Counter  - 1 x daily - 7 x weekly - 1 sets - 5-10 reps - 10 sec hold - Supine Single knee to Chest Stretch with Towel  - 1 x daily - 7 x weekly - 3 sets - 10 reps - Supine Lower Trunk Rotation  - 1 x daily - 7 x weekly - 1 sets - 5 reps - 20 sec hold - Cat Cow to Child's Pose  - 1 x daily - 7 x weekly - 3 sets - 10 reps ASSESSMENT:  CLINICAL IMPRESSION:  [place] requires much encouragement in order to continue working diligently with exercise.  Every movement " hurts" and nothing improves.  She did however continue exercises when using distraction and concentrating on colors and talking about subjects in which she is interested in.   She does report her Hands and feet " go numb"  Pt was told to continue moving to improve pain/ discomfort.  Pt/mother  encouraged to pursue a water based exercise for increased comfort. Will continue to reinforce exercise in HEP  Pt /mother provided with home walking program instructions.  EVAL- Patient is a 42 y.o. female Accompanied by mother who was seen today for physical therapy evaluation and treatment for low back pain without radiculopathy and Dextroscoliosis  and  about 6 months of worsening back pain post cortisone injections.  Pt /mother reports a sedentary lifestyle and sits for most of day.  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 She does lift boxes and distributes food.  Pt will benefit from skilled PT to address strength deficits and ameliorate pain symptoms. Mother reports that pt has most pain rising from bed in the morning.  She also reports one fall when hospitalized  for respiratory problems in Feb 2024 as she was getting out of bed.  [name] likes to sleep on edge of her bed and sleep patterns may be contributing to her ongoing back pain.     OBJECTIVE IMPAIRMENTS: decreased activity tolerance, difficulty walking, decreased ROM, decreased strength, improper body mechanics, postural dysfunction, obesity, and pain.   ACTIVITY LIMITATIONS: lifting, bending, standing, squatting, stairs, and transfers  PARTICIPATION LIMITATIONS:  job that [employer] developmental disabilities and    PERSONAL FACTORS: Carpal Tunnel,  DM, obesity, Developmental disorder,hyperglycemia  See medical chart are also affecting patient's functional outcome.   REHAB POTENTIAL: Fair Pt developmentally disabled and will depend on mother for consistency and compliance  CLINICAL DECISION MAKING: Evolving/moderate complexity  EVALUATION COMPLEXITY: Moderate   GOALS: Goals reviewed with patient? Yes  SHORT TERM GOALS: Target date: 09-26-22  Mother will be able to assist daughter with HEP Baseline:no knowledge Goal status: INITIAL  2.  Report pain decrease  from10 /10 to6 /10. At rest Baseline:  Goal status: INITIAL  3.  Demonstrate understanding of neutral posture and be more conscious of position and posture throughout the day.  Baseline:  Goal status: INITIAL  4.  LONG TERM GOALS: Target date: 10-18-22  Parent will be able to assist pt  with advanced HEP.  Baseline: no knowledge Goal status: INITIAL  2.  Demonstrate and verbalize techniques to reduce the risk of re-injury including: lifting, posture, body mechanics. Demonstrate with verbal assistance Baseline: no knowledge Goal status: INITIAL  3. Report a 50% reduction with pain with home and work tasks Baseline: 10/10 Goal status: INITIAL  4.  .  Pt will be educated and begin walking program 3-5 x a week for 20 minutes with 50% reduced pain Baseline: not walking more than 5-10 min and sits for most of day Goal status: INITIAL     PLAN:  PT FREQUENCY: 1-2x/week  PT DURATION: 6 weeks  PLANNED INTERVENTIONS: Therapeutic exercises, Therapeutic activity, Neuromuscular re-education, Balance training, Gait training, Patient/Family education, Self Care, Joint mobilization, Stair training, Dry Needling, Cryotherapy, Moist heat, Taping, Manual therapy, and Re-evaluation.  PLAN FOR NEXT SESSION: Review HEP   Garen Lah, PT, [facility] Certified Exercise Expert for the Aging Adult  09/24/22 2:34 PM Phone: [phone] Fax: [phone]

## 2022-09-24 NOTE — Patient Instructions (Signed)
WALKING  Walking is a great form of exercise to increase your strength, endurance and overall fitness.  A walking program can help you start slowly and gradually build endurance as you go.  Everyone's ability is different, so each person's starting point will be different.  You do not have to follow them exactly.  The are just samples. You should simply find out what's right for you and stick to that program.   In the beginning, you'll start off walking 2-3 times a day for short distances.  As you get stronger, you'll be walking further at just 1-2 times per day. American Medical Association says to walk 150 to 300 minutes for minimal health gains.   30 min to 60 min of walking 5 days a week. A. You Can Walk For A Certain Length Of Time Each Day    Walk 5 minutes 3 times per day.  Increase 2 minutes every 2 days (3 times per day).  Work up to 25-30 minutes (1-2 times per day).   Example:   Day 1-2 5 minutes 3 times per day   Day 7-8 12 minutes 2-3 times per day   Day 13-14 25 minutes 1-2 times per day  B. You Can Walk For a Certain Distance Each Day     Distance can be substituted for time.    Example:   3 trips to mailbox (at road)   3 trips to corner of block   3 trips around the block  C. Go to local high school and use the track.    Walk for distance ____ around track  Or time ____ minutes  D. Walk ___x_ Jog ____ Run ___  Please only do the exercises that your therapist has initialed and dated   Garen Lah, PT, [facility] Certified Exercise Expert for the Aging Adult  09/24/22 2:16 PM Phone: [phone] Fax: [phone]

## 2022-10-02 NOTE — Therapy (Signed)
OUTPATIENT PHYSICAL THERAPY THORACOLUMBAR EVALUATION   Patient Name: Regina Wood MRN: [id number] DOB:[birth year], 42 y.o., female Today's Date: 10/03/2022  END OF SESSION:  PT End of Session - 10/03/22 1158     Visit Number 3    Number of Visits 12    Date for PT Re-Evaluation 10/18/22    Authorization Type MCR/MCD    PT Start Time 1150    PT Stop Time 1228    PT Time Calculation (min) 38 min    Activity Tolerance Patient limited by pain    Behavior During Therapy [facility] for tasks assessed/performed              Past Medical History:  Diagnosis Date   Diabetes mellitus without complication (HCC)    Obesity    Pneumonia    History reviewed. No pertinent surgical history. Patient Active Problem List   Diagnosis Date Noted   Suspected sleep apnea 04/19/2022   Upper respiratory infection, acute 03/08/2022   Chronic respiratory failure (HCC) 03/07/2022   Abnormal CT of the chest 12/31/2019   Acute respiratory failure with hypoxia (HCC) 04/21/2019   Pneumonia 04/21/2019   Hyperglycemia 04/21/2019   HTN (hypertension) 04/21/2019   Developmental delay 04/21/2019    PCP: Regina Quitter, MD   REFERRING PROVIDER: Melina Fiddler, MD   REFERRING DIAG: back pain ,Severe scoliosis>50%   Rationale for Evaluation and Treatment: Rehabilitation  THERAPY DIAG:  Chronic right-sided low back pain without sciatica  Muscle weakness (generalized)  Abnormal posture  ONSET DATE: about 6 months ago.  Pt is accompanied by mother, Regina Wood  SUBJECTIVE:                                                                                                                                                                                           SUBJECTIVE STATEMENT:Mother is present with special needs daughter.Marland Kitchen  Pt/dtr unable to indicated exact number scale.   My back and feet fall asleep all the time. It is hard for me to walk a long time   EVAL- Mother will accompany patient  to PT due to pt develomental delay. I have back pain and both my knees get really tired.  Sometimes both my feet go to sleep.  (Neuropathy according to mother and with carpal tunnel. sleeping with O2 until a week ago and mother states she is breathing well and discontinued.  Pt states it is hard to get up from the  toilet.  It is hard for me to walk in between rooms.  I feel like a lot of spasms.  I have the most problems waking up in  the morning getting out of bed.  Mother states her daughter was in hospital in February and fell as she was getting out of bed to leave hospital.  PERTINENT HISTORY:  Carpal Tunnel,  DM, obesity, Developmental disorder,hyperglycemia  See medical chart  PAIN: Using pictures for pt to point to Are you having pain? Yes: NPRS scale: at rest 8/10 and at worst 10/10 Pain location: R low back over muscular Quadratus lumborum Pain description: "pain" unable to describe Aggravating factors: when waking up in the morningis the worst time and when she walks for a long time more than 5-10 minutes Relieving factors: nothing  PRECAUTIONS: None  WEIGHT BEARING RESTRICTIONS: No  FALLS:  Has patient fallen in last 6 months? Yes. 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   PLOF:  Pt developmentally impaired and is able to walk independently  PATIENT GOALS: want back to feel better  NEXT MD VISIT: TBD  OBJECTIVE:   DIAGNOSTIC FINDINGS:  No recent xrays of spine  PATIENT SURVEYS:  FOTO Not taken due to developmental disabilty  SCREENING FOR RED FLAGS: Bowel or bladder incontinence: No  COGNITION: Overall cognitive status: History of cognitive  impairments - at baseline     SENSATION: Complains about both haands and feet go to sleep  MUSCLE LENGTH: Hamstrings: Right 50 deg; Left 54 deg Thomas test: Bil tightness  POSTURE: rounded shoulders, forward head, and left pelvic obliquity  PALPATION: TTP over R Quadratus lumborum but not over spine, some tenderness over iliac crest and over R lower ribs and QL insertion  LUMBAR ROM:   AROM eval 10-03-22  Flexion Fingertips to mid shin p! Comingup to stand Fingertips to ankles  Extension WNL but with p! WNL  Right lateral flexion WNL but with p! WNL  Left lateral flexion WNL WNL  Right rotation WNL p! WNL  Left rotation WNL WNL   (Blank rows = not tested)  LOWER EXTREMITY ROM:     Active  Right eval Left eval  Hip flexion 60 60  Hip extension    Hip abduction    Hip adduction    Hip internal rotation    Hip external rotation    Knee flexion 110 110  Knee extension 0 0  Ankle dorsiflexion    Ankle plantarflexion    Ankle inversion    Ankle eversion     (Blank rows = not tested)  LOWER EXTREMITY MMT:  grossly 4/5  MMT Right eval Left eval  Hip flexion    Hip extension    Hip abduction    Hip adduction    Hip internal rotation    Hip external rotation    Knee flexion    Knee extension    Ankle dorsiflexion    Ankle plantarflexion 13/25 15/25  Ankle inversion    Ankle eversion     (Blank rows = not tested)    FUNCTIONAL TESTS:  5 times sit to stand: 13.88 sec 6 minute walk test: TBD    09-24-22  1166.3 ft  10-03-22  5xSTS  13.3 sec GAIT: Distance walked: 150 Assistive device utilized: None Level of assistance: SBA by mother Comments: Mother reports that she cannot walk for  more than 5 minutes.    TODAY'S TREATMENT:   [facility] Adult PT Treatment:                                                DATE: 10-03-22 Therapeutic Exercise: Supine SKTC with Towel  5 x 10 sec hold Supine LTR 5 reps - 20 sec hold Cat Cow to Child's Pose 3 sets - 10  rep Attempted Bird Dog 3 x  not easy for pt to repeat demo Prone hip extension  R and L 5 x  Supine Hamstring Stretch with Strap   2 x 30 sec each side Half Kneeling on Foam Pad 0 reps Tall Kneel Vertical Bridge 3 sets - 10 reps Lunging with UE support on back of chair 10 x each Standing 3-Way Leg Reach with RTB at Ankles and Counter Support 3 x 10 HEP and education for patient/mentally challenged and mother  [facility] Adult PT Treatment:                                                DATE: 09-24-22 1163.3 ft Therapeutic Exercise: Sit to stand with sink support Movement snack  1 x 10 , 2 x 10 with 10 lb   Standing 'L' Stretch at [employer] with elbows works better.  3 x 10-20 sec hold as needed with walking Supine Single knee to Chest Stretch with Towel  - 1 x daily - 7 x weekly - 3 sets - 10 reps Supine Lower Trunk Rotation  5 reps - 20 sec hold Cat Cow to Child's Pose  2 sets - 10 reps  Self Care: Walking program and discussed with mother on how to implement.  Also discussed benefits of aquatics since she was a member at [facility] and let lapse in order to return to a swimming class for exercise                                                                                                                         DATE: EVAL  and Issue HEP    PATIENT EDUCATION:  Education details: POC  Issue HEP  Explanation of findings to mother/pt Person educated: Patient and Parent Education method: Explanation, Demonstration, Tactile cues, Verbal cues, and Handouts Education comprehension: verbalized understanding, returned demonstration, verbal cues required, tactile cues required, and needs further education  HOME EXERCISE PROGRAM:  Access Code: [email or web address] URL: [email or web address] Date: 09/24/2022 Prepared by: Garen Lah  Program Notes [name] needs to get up every 1-2 hours to walk for at least 10 minutes   Do not remain sitting for hours  Exercises - Sit to stand with sink  support Movement snack  - 1 x daily - 7 x  weekly - 3 sets - 10 reps - Standing 'L' Stretch at Counter  - 1 x daily - 7 x weekly - 1 sets - 5-10 reps - 10 sec hold - Supine Single knee to Chest Stretch with Towel  - 1 x daily - 7 x weekly - 3 sets - 10 reps - Supine Lower Trunk Rotation  - 1 x daily - 7 x weekly - 1 sets - 5 reps - 20 sec hold - Cat Cow to Child's Pose  - 1 x daily - 7 x weekly - 3 sets - 10 reps Added 10-03-22  - Supine Hamstring Stretch with Strap  - 1 x daily - 7 x weekly - 1 sets - 3 reps - 15-30 sec hold - Half Kneeling on Foam Pad  - 1 x daily - 7 x weekly - 1 sets - 8-10 reps - Tall Kneel Vertical Bridge  - 1 x daily - 7 x weekly - 3 sets - 10 reps - Standing 3-Way Leg Reach with Resistance at Ankles and Counter Support  - 1 x daily - 7 x weekly - 3 sets - 10 reps - ASSESSMENT:  CLINICAL IMPRESSION:  [place] requires much encouragement and VC and TC in order to continue working diligently with exercise. Pt consistently complains of legs and arms falling asleep but improves with movement. Worked on HEP incorporating walking and morning back stretches and functional strength which needs to be reinforced the next 2 remaining visits.  Pt was told to continue moving to improve pain/ discomfort.  Pt/mother encouraged to pursue a water based exercise for increased comfort. Will continue to reinforce exercise in HEP for remaining 2 visits before DC  EVAL- Patient is a 42 y.o. female Accompanied by mother who was seen today for physical therapy evaluation and treatment for low back pain without radiculopathy and Dextroscoliosis  and about 6 months of worsening back pain post cortisone injections.  Pt /mother reports a sedentary lifestyle and sits for most of day.  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 She does lift boxes and distributes food.  Pt will benefit from skilled PT to address strength deficits and  ameliorate pain symptoms. Mother reports that pt has most pain rising from bed in the morning.  She also reports one fall when hospitalized  for respiratory problems in Feb 2024 as she was getting out of bed.  [name] likes to sleep on edge of her bed and sleep patterns may be contributing to her ongoing back pain.     OBJECTIVE IMPAIRMENTS: decreased activity tolerance, difficulty walking, decreased ROM, decreased strength, improper body mechanics, postural dysfunction, obesity, and pain.   ACTIVITY LIMITATIONS: lifting, bending, standing, squatting, stairs, and transfers  PARTICIPATION LIMITATIONS:  job that [employer] developmental disabilities and    PERSONAL FACTORS: Carpal Tunnel,  DM, obesity, Developmental disorder,hyperglycemia  See medical chart are also affecting patient's functional outcome.   REHAB POTENTIAL: Fair Pt developmentally disabled and will depend on mother for consistency and compliance  CLINICAL DECISION MAKING: Evolving/moderate complexity  EVALUATION COMPLEXITY: Moderate   GOALS: Goals reviewed with patient? Yes  SHORT TERM GOALS: Target date: 09-26-22  Mother will be able to assist daughter with HEP Baseline:no knowledge Goal status: INITIAL  2.  Report pain decrease from10 /10 to6 /10. At rest Baseline:  Goal status: INITIAL  3.  Demonstrate understanding of neutral posture and be more conscious of position and posture throughout  the day.  Baseline:  Goal status: INITIAL  4.  LONG TERM GOALS: Target date: 10-18-22  Parent will be able to assist pt  with advanced HEP.  Baseline: no knowledge Goal status: INITIAL  2.  Demonstrate and verbalize techniques to reduce the risk of re-injury including: lifting, posture, body mechanics. Demonstrate with verbal assistance Baseline: no knowledge Goal status: INITIAL  3. Report a 50% reduction with pain with home and work tasks Baseline: 10/10 Goal status: INITIAL  4.  .  Pt will be educated and begin  walking program 3-5 x a week for 20 minutes with 50% reduced pain Baseline: not walking more than 5-10 min and sits for most of day Goal status: INITIAL     PLAN:  PT FREQUENCY: 1-2x/week  PT DURATION: 6 weeks  PLANNED INTERVENTIONS: Therapeutic exercises, Therapeutic activity, Neuromuscular re-education, Balance training, Gait training, Patient/Family education, Self Care, Joint mobilization, Stair training, Dry Needling, Cryotherapy, Moist heat, Taping, Manual therapy, and Re-evaluation.  PLAN FOR NEXT SESSION: Review HEP   Garen Lah, PT, [facility] Certified Exercise Expert for the Aging Adult  10/03/22 1:59 PM Phone: [phone] Fax: [phone]

## 2022-10-03 ENCOUNTER — Ambulatory Visit: Payer: Medicare HMO | Admitting: Physical Therapy

## 2022-10-03 ENCOUNTER — Encounter: Payer: Self-pay | Admitting: Physical Therapy

## 2022-10-03 DIAGNOSIS — M545 Low back pain, unspecified: Secondary | ICD-10-CM | POA: Diagnosis not present

## 2022-10-03 DIAGNOSIS — R293 Abnormal posture: Secondary | ICD-10-CM

## 2022-10-03 DIAGNOSIS — M6281 Muscle weakness (generalized): Secondary | ICD-10-CM | POA: Diagnosis not present

## 2022-10-03 DIAGNOSIS — G8929 Other chronic pain: Secondary | ICD-10-CM | POA: Diagnosis not present

## 2022-10-09 NOTE — Therapy (Signed)
OUTPATIENT PHYSICAL THERAPY THORACOLUMBAR EVALUATION   Patient Name: Regina Wood MRN: [id number] DOB:1981/04/01, 42 y.o., female Today's Date: 10/10/2022  END OF SESSION:  PT End of Session - 10/10/22 1515     Visit Number 4    Number of Visits 12    Date for PT Re-Evaluation 10/18/22    Authorization Type MCR/MCD    PT Start Time 1417    PT Stop Time 1505    PT Time Calculation (min) 48 min    Activity Tolerance Patient limited by pain;Patient tolerated treatment well    Behavior During Therapy [facility] for tasks assessed/performed               Past Medical History:  Diagnosis Date   Diabetes mellitus without complication (HCC)    Obesity    Pneumonia    History reviewed. No pertinent surgical history. Patient Active Problem List   Diagnosis Date Noted   Suspected sleep apnea 04/19/2022   Upper respiratory infection, acute 03/08/2022   Chronic respiratory failure (HCC) 03/07/2022   Abnormal CT of the chest 12/31/2019   Acute respiratory failure with hypoxia (HCC) 04/21/2019   Pneumonia 04/21/2019   Hyperglycemia 04/21/2019   HTN (hypertension) 04/21/2019   Developmental delay 04/21/2019    PCP: Melida Quitter, MD   REFERRING PROVIDER: Melina Fiddler, MD   REFERRING DIAG: back pain ,Severe scoliosis>50%   Rationale for Evaluation and Treatment: Rehabilitation  THERAPY DIAG:  Chronic right-sided low back pain without sciatica  Muscle weakness (generalized)  Abnormal posture  ONSET DATE: about 6 months ago.  Pt is accompanied by mother, [name]  SUBJECTIVE:                                                                                                                                                                                           SUBJECTIVE STATEMENT:Mother is present with special needs daughter.Marland Kitchen  Pt/dtr unable to indicated exact number scale.   My back and feet fall asleep all the time. It is hard for me to walk a long time. I can't  use the treadmill at the gym because I hurt    EVAL- Mother will accompany patient to PT due to pt develomental delay. I have back pain and both my knees get really tired.  Sometimes both my feet go to sleep.  (Neuropathy according to mother and with carpal tunnel. sleeping with O2 until a week ago and mother states she is breathing well and discontinued.  Pt states it is hard to get up from the  toilet.  It is hard for me to walk in between rooms.  I feel like a lot of spasms.  I have the most problems waking up in the morning getting out of bed.  Mother states her daughter was in hospital in February and fell as she was getting out of bed to leave hospital.  PERTINENT HISTORY:  Carpal Tunnel,  DM, obesity, Developmental disorder,hyperglycemia  See medical chart  PAIN: Using pictures for pt to point to Are you having pain? Yes: NPRS scale: at rest 8/10 and at worst 10/10 Pain location: R low back over muscular Quadratus lumborum Pain description: "pain" unable to describe Aggravating factors: when waking up in the morningis the worst time and when she walks for a long time more than 5-10 minutes Relieving factors: nothing  PRECAUTIONS: None  WEIGHT BEARING RESTRICTIONS: No  FALLS:  Has patient fallen in last 6 months? Yes. 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   PLOF:  Pt developmentally impaired and is able to walk independently  PATIENT GOALS: want back to feel better  NEXT MD VISIT: TBD  OBJECTIVE:   DIAGNOSTIC FINDINGS:  No recent xrays of spine  PATIENT SURVEYS:  FOTO Not taken due to developmental disabilty  SCREENING FOR RED FLAGS: Bowel or bladder  incontinence: No  COGNITION: Overall cognitive status: History of cognitive impairments - at baseline     SENSATION: Complains about both haands and feet go to sleep  MUSCLE LENGTH: Hamstrings: Right 50 deg; Left 54 deg Thomas test: Bil tightness  POSTURE: rounded shoulders, forward head, and left pelvic obliquity  PALPATION: TTP over R Quadratus lumborum but not over spine, some tenderness over iliac crest and over R lower ribs and QL insertion  LUMBAR ROM:   AROM eval 10-03-22  Flexion Fingertips to mid shin p! Comingup to stand Fingertips to ankles  Extension WNL but with p! WNL  Right lateral flexion WNL but with p! WNL  Left lateral flexion WNL WNL  Right rotation WNL p! WNL  Left rotation WNL WNL   (Blank rows = not tested)  LOWER EXTREMITY ROM:     Active  Right eval Left eval  Hip flexion 60 60  Hip extension    Hip abduction    Hip adduction    Hip internal rotation    Hip external rotation    Knee flexion 110 110  Knee extension 0 0  Ankle dorsiflexion    Ankle plantarflexion    Ankle inversion    Ankle eversion     (Blank rows = not tested)  LOWER EXTREMITY MMT:  grossly 4/5  MMT Right eval Left eval  Hip flexion    Hip extension    Hip abduction    Hip adduction    Hip internal rotation    Hip external rotation    Knee flexion    Knee extension    Ankle dorsiflexion    Ankle plantarflexion 13/25 15/25  Ankle inversion    Ankle eversion     (Blank rows = not tested)    FUNCTIONAL TESTS:  5 times sit to stand: 13.88 sec 6 minute walk test: TBD    09-24-22  1166.3 ft with multiple rest breaks 10-10-22 1163ft  with 2 rest breaks.  10-03-22  5xSTS  13.3 sec 10-10-22  5 x STS 11.89 sec GAIT: Distance walked: 150 Assistive device utilized: None Level of assistance: SBA by mother Comments: Mother reports that she cannot walk for more than 5 minutes.    TODAY'S TREATMENT:   [facility] Adult PT Treatment:                                                 DATE: 10-10-22 10-10-22 1154ft  with 2 rest breaks. But did walk at a faster cadence than initial eval Therapeutic Exercise: Reinforcing HEP for mom and patient, Encouraging Mom to correct daughter and guide as needed Supine SKTC with Towel  5 x 10 sec hold VC and TC Supine LTR 5 reps - 20 sec hold Cat Cow to Child's Pose 10 rep Vertical bridging x 10 in tall kneeling on foam pad Half Kneeling on Foam Pad 10 reps Treadmill walking at 1.5 mph for 3.5 minutes with increasing elevation with "increased back pain"  Pt said felt pain but continued walking with no change in gait Supine Hamstring Stretch with Strap   2 x 30 sec each side Lunging with UE support on back of chair 10 x each STS with 10 lb weight 2 x 10  with much encouragement Deep squat holding onto sink edge at counter for decrease in back pain   [facility] Adult PT Treatment:                                                DATE: 10-03-22 Therapeutic Exercise: Supine SKTC with Towel  5 x 10 sec hold Supine LTR 5 reps - 20 sec hold Cat Cow to Child's Pose 3 sets - 10 rep Attempted Bird Dog 3 x  not easy for pt to repeat demo Prone hip extension  R and L 5 x  Supine Hamstring Stretch with Strap   2 x 30 sec each side Half Kneeling on Foam Pad 0 reps Tall Kneel Vertical Bridge 3 sets - 10 reps Lunging with UE support on back of chair 10 x each Standing 3-Way Leg Reach with RTB at Ankles and Counter Support 3 x 10 HEP and education for patient/mentally challenged and mother  [facility] Adult PT Treatment:                                                DATE: 09-24-22 1163.3 ft Therapeutic Exercise: Sit to stand with sink support Movement snack  1 x 10 , 2 x 10 with 10 lb   Standing 'L' Stretch at [employer] with elbows works better.  3 x 10-20 sec hold as needed with walking Supine Single knee to Chest Stretch with Towel  - 1 x daily - 7 x weekly - 3 sets - 10 reps Supine Lower Trunk Rotation  5 reps - 20 sec hold Cat Cow to Child's  Pose  2 sets - 10 reps  Self Care: Walking program and discussed with mother on how to implement.  Also discussed benefits of aquatics since she was a member at [facility] and let lapse in order  to return to a swimming class for exercise                                                                                                                         DATE: EVAL  and Issue HEP    PATIENT EDUCATION:  Education details: POC  Issue HEP  Explanation of findings to mother/pt Person educated: Patient and Parent Education method: Explanation, Demonstration, Tactile cues, Verbal cues, and Handouts Education comprehension: verbalized understanding, returned demonstration, verbal cues required, tactile cues required, and needs further education  HOME EXERCISE PROGRAM:  Access Code: [email or web address] URL: [email or web address] Date: 09/24/2022 Prepared by: Garen Lah  Program Notes [name] needs to get up every 1-2 hours to walk for at least 10 minutes   Do not remain sitting for hours  Exercises - Sit to stand with sink support Movement snack  - 1 x daily - 7 x weekly - 3 sets - 10 reps - Standing 'L' Stretch at Counter  - 1 x daily - 7 x weekly - 1 sets - 5-10 reps - 10 sec hold - Supine Single knee to Chest Stretch with Towel  - 1 x daily - 7 x weekly - 3 sets - 10 reps - Supine Lower Trunk Rotation  - 1 x daily - 7 x weekly - 1 sets - 5 reps - 20 sec hold - Cat Cow to Child's Pose  - 1 x daily - 7 x weekly - 3 sets - 10 reps Added 10-03-22  - Supine Hamstring Stretch with Strap  - 1 x daily - 7 x weekly - 1 sets - 3 reps - 15-30 sec hold - Half Kneeling on Foam Pad  - 1 x daily - 7 x weekly - 1 sets - 8-10 reps - Tall Kneel Vertical Bridge  - 1 x daily - 7 x weekly - 3 sets - 10 reps - Standing 3-Way Leg Reach with Resistance at Ankles and Counter Support  - 1 x daily - 7 x weekly - 3 sets - 10 reps - ASSESSMENT:  CLINICAL IMPRESSION:  [place] requires much encouragement and VC  and TC in order to continue working diligently with exercise. Initiated session with 6 [id number] 1123ft  with 2 rest breaks and 5 x STS 11.89 sec with improvement in cadence and in increase in LE strength. Mom states she is moving more at home but it takes a lot for her to encourage [place] to be consistent with exercise.  Mom is having cousins /family members to come to help assist with exercises.  Pt /mom will also pursue aquatics at [facility] for water aerobics.  Pt consistently complains of legs and arms falling asleep but improves with movement. Reinforcing HEP  and incorporating walking and morning back stretches and functional strength. Pt was told to continue moving to improve pain/ discomfort. Will DC next visit after reinforcing current HEP  EVAL- Patient is a 42 y.o. female Accompanied by mother who was seen today  for physical therapy evaluation and treatment for low back pain without radiculopathy and Dextroscoliosis  and about 6 months of worsening back pain post cortisone injections.  Pt /mother reports a sedentary lifestyle and sits for most of day.  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 She does lift boxes and distributes food.  Pt will benefit from skilled PT to address strength deficits and ameliorate pain symptoms. Mother reports that pt has most pain rising from bed in the morning.  She also reports one fall when hospitalized  for respiratory problems in Feb 2024 as she was getting out of bed.  [name] likes to sleep on edge of her bed and sleep patterns may be contributing to her ongoing back pain.     OBJECTIVE IMPAIRMENTS: decreased activity tolerance, difficulty walking, decreased ROM, decreased strength, improper body mechanics, postural dysfunction, obesity, and pain.   ACTIVITY LIMITATIONS: lifting, bending, standing, squatting, stairs, and transfers  PARTICIPATION LIMITATIONS:  job that [employer] developmental  disabilities and    PERSONAL FACTORS: Carpal Tunnel,  DM, obesity, Developmental disorder,hyperglycemia  See medical chart are also affecting patient's functional outcome.   REHAB POTENTIAL: Fair Pt developmentally disabled and will depend on mother for consistency and compliance  CLINICAL DECISION MAKING: Evolving/moderate complexity  EVALUATION COMPLEXITY: Moderate   GOALS: Goals reviewed with patient? Yes  SHORT TERM GOALS: Target date: 09-26-22  Mother will be able to assist daughter with HEP Baseline:no knowledge Goal status: MET  2.  Report pain decrease from10 /10 to6 /10. At rest Baseline: Pt with difficulty using NPS scale Goal status: Unable to assess due to cognition  3.  Demonstrate understanding of neutral posture and be more conscious of position and posture throughout the day.  Baseline:  10-10-22  Pt demonstrates rounded posture and neutral posture with PT Goal status MET  4.  LONG TERM GOALS: Target date: 10-18-22  Parent will be able to assist pt  with advanced HEP.  Baseline: no knowledge Goal status:ONGOING  2.  Demonstrate and verbalize techniques to reduce the risk of re-injury including: lifting, posture, body mechanics. Demonstrate with verbal assistance Baseline: no knowledge 10-10-22 educated Goal status:  Partially MET  3. Report a 50% reduction with pain with home and work tasks Baseline: 10/10 Goal status:ONGOING  4.  .  Pt will be educated and begin walking program 3-5 x a week for 20 minutes with 50% reduced pain Baseline: not walking more than 5-10 min and sits for most of day Goal status:ONGOING     PLAN:  PT FREQUENCY: 1-2x/week  PT DURATION: 6 weeks  PLANNED INTERVENTIONS: Therapeutic exercises, Therapeutic activity, Neuromuscular re-education, Balance training, Gait training, Patient/Family education, Self Care, Joint mobilization, Stair training, Dry Needling, Cryotherapy, Moist heat, Taping, Manual therapy, and  Re-evaluation.  PLAN FOR NEXT SESSION: Review HEP   Garen Lah, PT, [facility] Certified Exercise Expert for the Aging Adult  10/10/22 3:34 PM Phone: [phone] Fax: [phone]

## 2022-10-10 ENCOUNTER — Ambulatory Visit: Payer: Medicare HMO | Attending: Sports Medicine | Admitting: Physical Therapy

## 2022-10-10 ENCOUNTER — Encounter: Payer: Self-pay | Admitting: Physical Therapy

## 2022-10-10 DIAGNOSIS — M545 Low back pain, unspecified: Secondary | ICD-10-CM | POA: Diagnosis not present

## 2022-10-10 DIAGNOSIS — R293 Abnormal posture: Secondary | ICD-10-CM | POA: Diagnosis not present

## 2022-10-10 DIAGNOSIS — G8929 Other chronic pain: Secondary | ICD-10-CM | POA: Diagnosis not present

## 2022-10-10 DIAGNOSIS — M6281 Muscle weakness (generalized): Secondary | ICD-10-CM | POA: Insufficient documentation

## 2022-10-11 DIAGNOSIS — J9601 Acute respiratory failure with hypoxia: Secondary | ICD-10-CM | POA: Diagnosis not present

## 2022-10-11 DIAGNOSIS — J069 Acute upper respiratory infection, unspecified: Secondary | ICD-10-CM | POA: Diagnosis not present

## 2022-10-11 DIAGNOSIS — R6250 Unspecified lack of expected normal physiological development in childhood: Secondary | ICD-10-CM | POA: Diagnosis not present

## 2022-10-17 ENCOUNTER — Ambulatory Visit: Payer: Medicare HMO | Admitting: Physical Therapy

## 2022-10-22 DIAGNOSIS — J9601 Acute respiratory failure with hypoxia: Secondary | ICD-10-CM | POA: Diagnosis not present

## 2022-10-28 NOTE — Therapy (Signed)
OUTPATIENT PHYSICAL THERAPY TREATMENT + DISCHARGE SUMMARY   Patient Name: Regina Wood MRN: [id number] DOB:07-26-1980, 42 y.o., female Today's Date: 10/29/2022  PHYSICAL THERAPY DISCHARGE SUMMARY  Visits from Start of Care: 5  Current functional level related to goals / functional outcomes: Continues to report pain with daily activities/mobility   Remaining deficits: Low back pain, muscular fatigue   Education / Equipment: HEP and safe performance, walking program, discharge education, follow up with provider   Patient/caregiver agrees to discharge. Patient goals were partially met. Patient is being discharged due to lack of progress.   END OF SESSION:  PT End of Session - 10/29/22 1504     Visit Number 5    Number of Visits 12    Authorization Type MCR/MCD    PT Start Time 1504    PT Stop Time 1544 (P)     PT Time Calculation (min) 40 min (P)     Activity Tolerance Patient tolerated treatment well;Patient limited by pain (P)     Behavior During Therapy WFL for tasks assessed/performed                Past Medical History:  Diagnosis Date   Diabetes mellitus without complication (HCC)    Obesity    Pneumonia    History reviewed. No pertinent surgical history. Patient Active Problem List   Diagnosis Date Noted   Suspected sleep apnea 04/19/2022   Upper respiratory infection, acute 03/08/2022   Chronic respiratory failure (HCC) 03/07/2022   Abnormal CT of the chest 12/31/2019   Acute respiratory failure with hypoxia (HCC) 04/21/2019   Pneumonia 04/21/2019   Hyperglycemia 04/21/2019   HTN (hypertension) 04/21/2019   Developmental delay 04/21/2019    PCP: Melida Quitter, MD   REFERRING PROVIDER: Melina Fiddler, MD   REFERRING DIAG: back pain ,Severe scoliosis>50%   Rationale for Evaluation and Treatment: Rehabilitation  THERAPY DIAG:  Chronic right-sided low back pain without sciatica  Muscle weakness (generalized)  Abnormal  posture  ONSET DATE: about 6 months ago.  Pt is accompanied by mother, Lavonia Eager  SUBJECTIVE:                                                                                                                                                                                           SUBJECTIVE STATEMENT: Pt accompanied by her mother throughout. Sees doctor in July. Denies any overt changes since start of care, notes difficulty adhering to exercise program. They state they feel ready to discharge today    EVAL- Mother will accompany patient to PT due to pt develomental delay. I have back pain and both  my knees get really tired.  Sometimes both my feet go to sleep.  (Neuropathy according to mother and with carpal tunnel. sleeping with O2 until a week ago and mother states she is breathing well and discontinued.  Pt states it is hard to get up from the  toilet.  It is hard for me to walk in between rooms.  I feel like a lot of spasms.  I have the most problems waking up in the morning getting out of bed.  Mother states her daughter was in hospital in February and fell as she was getting out of bed to leave hospital.  PERTINENT HISTORY:  Carpal Tunnel,  DM, obesity, Developmental disorder,hyperglycemia  See medical chart  PAIN: Using pictures for pt to point to Are you having pain? Yes: NPRS scale: at rest 8/10 and at worst 10/10 Pain location: R low back over muscular Quadratus lumborum Pain description: "pain" unable to describe Aggravating factors: when waking up in the morningis the worst time and when she walks for a long time more than 5-10 minutes Relieving factors: nothing  PRECAUTIONS: None  WEIGHT BEARING RESTRICTIONS: No  FALLS:  Has patient fallen in last 6 months? Yes. Number of falls 1 when she was getting out of bed at the hospital  LIVING ENVIRONMENT: Lives with: lives with their family Lives in: House/apartment Stairs: Yes: External: 1 steps; none Has following equipment  at home: Dan Humphreys - 2 wheeled  OCCUPATION: developmentally disabled but works for [employer] and some churches to [employer].   PLOF:  Pt developmentally impaired and is able to walk independently  PATIENT GOALS: want back to feel better  NEXT MD VISIT: TBD  OBJECTIVE: (objective measures completed at initial evaluation unless otherwise dated)   DIAGNOSTIC FINDINGS:  No recent xrays of spine  PATIENT SURVEYS:  FOTO Not taken due to developmental disabilty  SCREENING FOR RED FLAGS: Bowel or bladder incontinence: No  COGNITION: Overall cognitive status: History of cognitive impairments - at baseline     SENSATION: Complains about both haands and feet go to sleep  MUSCLE LENGTH: Hamstrings: Right 50 deg; Left 54 deg Thomas test: Bil tightness  POSTURE: rounded shoulders, forward head, and left pelvic obliquity  PALPATION: TTP over R Quadratus lumborum but not over spine, some tenderness over iliac crest and over R lower ribs and QL insertion  LUMBAR ROM:   AROM eval 10-03-22  Flexion Fingertips to mid shin p! Comingup to stand Fingertips to ankles  Extension WNL but with p! WNL  Right lateral flexion WNL but with p! WNL  Left lateral flexion WNL WNL  Right rotation WNL p! WNL  Left rotation WNL WNL   (Blank rows = not tested) 10/29/22: Grossly WNL with functional observation all planes but describes pain with all movement  LOWER EXTREMITY ROM:     Active  Right eval Left eval  Hip flexion 60 60  Hip extension    Hip abduction    Hip adduction    Hip internal rotation    Hip external rotation    Knee flexion 110 110  Knee extension 0 0  Ankle dorsiflexion    Ankle plantarflexion    Ankle inversion    Ankle eversion     (Blank rows = not tested)  LOWER EXTREMITY MMT:  grossly 4/5  MMT Right eval Left eval  Hip flexion    Hip extension    Hip abduction    Hip adduction    Hip  internal rotation    Hip external rotation     Knee flexion    Knee extension    Ankle dorsiflexion    Ankle plantarflexion 13/25 15/25  Ankle inversion    Ankle eversion     (Blank rows = not tested)    FUNCTIONAL TESTS:  5 times sit to stand: 13.88 sec 6 minute walk test: TBD    09-24-22  1166.3 ft with multiple rest breaks 10-10-22 1158ft  with 2 rest breaks.  10-03-22  5xSTS  13.3 sec 10-10-22  5 x STS 11.89 sec GAIT: Distance walked: 150 Assistive device utilized: None Level of assistance: SBA by mother Comments: Mother reports that she cannot walk for more than 5 minutes.    TODAY'S TREATMENT:  [facility] Adult PT Treatment:                                                DATE: 10/29/22 Therapeutic Exercise: HEP performance/education, emphasis on safe performance (particularly with floor transfers for tall kneeling) Supine LE ER 3x30sec  Supine LTR x5 each direction, VC for performance Cat/cow x5 Squat at counter w/ UE support x8 Blue band hip 3 way reach x5 each LE cues for posture Tall kneeling vertical bridges on airex pad x8(CGA for safety w/ achieving position and returning to standing with half kneel technique and UE support)  Therapeutic Activity: Education/discussion re: discharge education, symptom response to activity, walking program, activity modification as needed, promoting movement as tolerated, following up with provider    PATIENT EDUCATION:  Education details: rationale for interventions, HEP, progress w/ PT, PT POC, discharge education, safety w/ activity, walking program based on symptoms/tolerance Person educated: Patient and Parent Education method: Explanation, Demonstration, Tactile cues, Verbal cues, and Handouts Education comprehension: verbalized understanding, returned demonstration, verbal cues required, tactile cues required, and needs further education  HOME EXERCISE PROGRAM: Access Code: [email or web address] URL: [email or web address] Date: 09/24/2022 Prepared by: Garen Lah  Program Notes [name] needs to get up every 1-2 hours to walk for at least 10 minutes   Do not remain sitting for hours  Exercises - Sit to stand with sink support Movement snack  - 1 x daily - 7 x weekly - 3 sets - 10 reps - Standing 'L' Stretch at Counter  - 1 x daily - 7 x weekly - 1 sets - 5-10 reps - 10 sec hold - Supine Single knee to Chest Stretch with Towel  - 1 x daily - 7 x weekly - 3 sets - 10 reps - Supine Lower Trunk Rotation  - 1 x daily - 7 x weekly - 1 sets - 5 reps - 20 sec hold - Cat Cow to Child's Pose  - 1 x daily - 7 x weekly - 3 sets - 10 reps Added 10-03-22  - Supine Hamstring Stretch with Strap  - 1 x daily - 7 x weekly - 1 sets - 3 reps - 15-30 sec hold - Half Kneeling on Foam Pad  - 1 x daily - 7 x weekly - 1 sets - 8-10 reps - Tall Kneel Vertical Bridge  - 1 x daily - 7 x weekly - 3 sets - 10 reps - Standing 3-Way Leg Reach with Resistance at Ankles and Counter Support  - 1 x daily - 7 x weekly - 3 sets - 10 reps - ASSESSMENT:  CLINICAL IMPRESSION: 10/29/2022 Pt arrives with continued report of mid back pain, states it is mostly unchanged compared to start of care. Pt and her mother report limited ability to adhere to exercise program and is requiring significant encouragement to perform, states they are trying to have family members help out. Per notes from primary PT and discussion w/ pt/caregiver, will discharge today given limited progress since start of care. Today focusing on HEP review with focus on maximizing tolerance and strategies to improve adherence. Reports pain throughout although when discussing with pt she describes more as muscular fatigue. Of note, pt demos reduced control with descent onto airex for tall kneeling bridges despite cues/CGA, obtains small abrasion on anterior portion of R knee - no observable swelling/erythema, pt denies any pain after and is provided with bandage, able to tolerate remainder of activities without any adverse event  or pain, functional mechanics remain unchanged. Education is provided on importance of safety with achieving exercise positions, proper cushioning/support, and controlled movement, pt demonstrates improved mechanics with return to standing position after tall kneeling. Pt/caregiver verbalize understanding/agreement with plan. Recommend discharge to independent HEP/walking program, follow up with referring provider. Pt departs today's session in no acute distress, all voiced questions/concerns addressed appropriately from PT perspective.    EVAL- Patient is a 42 y.o. female Accompanied by mother who was seen today for physical therapy evaluation and treatment for low back pain without radiculopathy and Dextroscoliosis  and about 6 months of worsening back pain post cortisone injections.  Pt /mother reports a sedentary lifestyle and sits for most of day.  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 She does lift boxes and distributes food.  Pt will benefit from skilled PT to address strength deficits and ameliorate pain symptoms. Mother reports that pt has most pain rising from bed in the morning.  She also reports one fall when hospitalized  for respiratory problems in Feb 2024 as she was getting out of bed.  [name] likes to sleep on edge of her bed and sleep patterns may be contributing to her ongoing back pain.     OBJECTIVE IMPAIRMENTS: decreased activity tolerance, difficulty walking, decreased ROM, decreased strength, improper body mechanics, postural dysfunction, obesity, and pain.   ACTIVITY LIMITATIONS: lifting, bending, standing, squatting, stairs, and transfers  PARTICIPATION LIMITATIONS:  job that [employer] developmental disabilities and    PERSONAL FACTORS: Carpal Tunnel,  DM, obesity, Developmental disorder,hyperglycemia  See medical chart are also affecting patient's functional outcome.   REHAB POTENTIAL: Fair Pt developmentally  disabled and will depend on mother for consistency and compliance  CLINICAL DECISION MAKING: Evolving/moderate complexity  EVALUATION COMPLEXITY: Moderate   GOALS: Goals reviewed with patient? Yes  SHORT TERM GOALS: Target date: 09-26-22  Mother will be able to assist daughter with HEP Baseline:no knowledge Goal status: MET  2.  Report pain decrease from10 /10 to6 /10. At rest Baseline: Pt with difficulty using NPS scale Goal status: Unable to assess due to cognition  3.  Demonstrate understanding of neutral posture and be more conscious of position and posture throughout the day.  Baseline:  10-10-22  Pt demonstrates rounded posture and neutral posture with PT Goal status MET    LONG TERM GOALS: Target date: 10-18-22  Parent will be able to assist pt  with advanced HEP.  Baseline: no knowledge 10/29/22: verbalizes knowledge/understanding  Goal status: MET  2.  Demonstrate and verbalize techniques to reduce the risk of  re-injury including: lifting, posture, body mechanics. Demonstrate with verbal assistance Baseline: no knowledge 10-10-22 educated 10/29/22: educated and verbalizes understanding   Goal status:  MET  3. Report a 50% reduction with pain with home and work tasks Baseline: 10/10 10/29/22: pt denies change in pain, limited ability to use NPS  given cognition Goal status: NOT MET   4.  .  Pt will be educated and begin walking program 3-5 x a week for 20 minutes with 50% reduced pain Baseline: not walking more than 5-10 min and sits for most of day 10/29/22: no change in walking habits reported   Goal status: NOT MET      PLAN: Discharge 10/29/22    PT FREQUENCY: NA   PT DURATION: NA   PLANNED INTERVENTIONS: Therapeutic exercises, Therapeutic activity, Neuromuscular re-education, Balance training, Gait training, Patient/Family education, Self Care, Joint mobilization, Stair training, Dry Needling, Cryotherapy, Moist heat, Taping, Manual therapy, and  Re-evaluation.  PLAN FOR NEXT SESSION: discharge to independent HEP w/ caregiver assist, follow up with provider   Ashley Murrain PT, DPT 10/29/2022 5:43 PM

## 2022-10-29 ENCOUNTER — Encounter: Payer: Self-pay | Admitting: Physical Therapy

## 2022-10-29 ENCOUNTER — Ambulatory Visit: Payer: Medicare HMO | Admitting: Physical Therapy

## 2022-10-29 DIAGNOSIS — G8929 Other chronic pain: Secondary | ICD-10-CM | POA: Diagnosis not present

## 2022-10-29 DIAGNOSIS — M6281 Muscle weakness (generalized): Secondary | ICD-10-CM | POA: Diagnosis not present

## 2022-10-29 DIAGNOSIS — M545 Low back pain, unspecified: Secondary | ICD-10-CM | POA: Diagnosis not present

## 2022-10-29 DIAGNOSIS — R293 Abnormal posture: Secondary | ICD-10-CM | POA: Diagnosis not present

## 2022-11-10 DIAGNOSIS — J069 Acute upper respiratory infection, unspecified: Secondary | ICD-10-CM | POA: Diagnosis not present

## 2022-11-10 DIAGNOSIS — R6250 Unspecified lack of expected normal physiological development in childhood: Secondary | ICD-10-CM | POA: Diagnosis not present

## 2022-11-10 DIAGNOSIS — J9601 Acute respiratory failure with hypoxia: Secondary | ICD-10-CM | POA: Diagnosis not present

## 2022-11-13 DIAGNOSIS — L83 Acanthosis nigricans: Secondary | ICD-10-CM | POA: Diagnosis not present

## 2022-11-13 DIAGNOSIS — L821 Other seborrheic keratosis: Secondary | ICD-10-CM | POA: Diagnosis not present

## 2022-11-13 DIAGNOSIS — L811 Chloasma: Secondary | ICD-10-CM | POA: Diagnosis not present

## 2022-11-19 DIAGNOSIS — F429 Obsessive-compulsive disorder, unspecified: Secondary | ICD-10-CM | POA: Diagnosis not present

## 2022-11-21 DIAGNOSIS — J9601 Acute respiratory failure with hypoxia: Secondary | ICD-10-CM | POA: Diagnosis not present

## 2022-11-26 DIAGNOSIS — Z6841 Body Mass Index (BMI) 40.0 and over, adult: Secondary | ICD-10-CM | POA: Diagnosis not present

## 2022-11-26 DIAGNOSIS — R809 Proteinuria, unspecified: Secondary | ICD-10-CM | POA: Diagnosis not present

## 2022-11-26 DIAGNOSIS — E1121 Type 2 diabetes mellitus with diabetic nephropathy: Secondary | ICD-10-CM | POA: Diagnosis not present

## 2022-11-26 DIAGNOSIS — I10 Essential (primary) hypertension: Secondary | ICD-10-CM | POA: Diagnosis not present

## 2022-11-26 DIAGNOSIS — M4186 Other forms of scoliosis, lumbar region: Secondary | ICD-10-CM | POA: Diagnosis not present

## 2022-11-26 DIAGNOSIS — H4710 Unspecified papilledema: Secondary | ICD-10-CM | POA: Diagnosis not present

## 2022-12-03 DIAGNOSIS — H04123 Dry eye syndrome of bilateral lacrimal glands: Secondary | ICD-10-CM | POA: Diagnosis not present

## 2022-12-03 DIAGNOSIS — H4710 Unspecified papilledema: Secondary | ICD-10-CM | POA: Diagnosis not present

## 2022-12-03 DIAGNOSIS — H40033 Anatomical narrow angle, bilateral: Secondary | ICD-10-CM | POA: Diagnosis not present

## 2022-12-03 DIAGNOSIS — E119 Type 2 diabetes mellitus without complications: Secondary | ICD-10-CM | POA: Diagnosis not present

## 2022-12-05 ENCOUNTER — Ambulatory Visit
Admission: RE | Admit: 2022-12-05 | Discharge: 2022-12-05 | Disposition: A | Discharge status: Home / Self Care | Payer: Medicare HMO | Source: Ambulatory Visit | Attending: Emergency Medicine | Admitting: Emergency Medicine

## 2022-12-05 DIAGNOSIS — J984 Other disorders of lung: Secondary | ICD-10-CM

## 2022-12-05 DIAGNOSIS — R918 Other nonspecific abnormal finding of lung field: Secondary | ICD-10-CM | POA: Diagnosis not present

## 2022-12-05 DIAGNOSIS — R9389 Abnormal findings on diagnostic imaging of other specified body structures: Secondary | ICD-10-CM

## 2022-12-10 DIAGNOSIS — G5603 Carpal tunnel syndrome, bilateral upper limbs: Secondary | ICD-10-CM | POA: Diagnosis not present

## 2022-12-11 DIAGNOSIS — J069 Acute upper respiratory infection, unspecified: Secondary | ICD-10-CM | POA: Diagnosis not present

## 2022-12-11 DIAGNOSIS — J9601 Acute respiratory failure with hypoxia: Secondary | ICD-10-CM | POA: Diagnosis not present

## 2022-12-11 DIAGNOSIS — R6250 Unspecified lack of expected normal physiological development in childhood: Secondary | ICD-10-CM | POA: Diagnosis not present

## 2022-12-22 DIAGNOSIS — J9601 Acute respiratory failure with hypoxia: Secondary | ICD-10-CM | POA: Diagnosis not present

## 2023-01-07 ENCOUNTER — Other Ambulatory Visit: Payer: Self-pay | Admitting: Neurology

## 2023-01-07 DIAGNOSIS — G629 Polyneuropathy, unspecified: Secondary | ICD-10-CM

## 2023-01-11 DIAGNOSIS — J9601 Acute respiratory failure with hypoxia: Secondary | ICD-10-CM | POA: Diagnosis not present

## 2023-01-11 DIAGNOSIS — R6250 Unspecified lack of expected normal physiological development in childhood: Secondary | ICD-10-CM | POA: Diagnosis not present

## 2023-01-11 DIAGNOSIS — J069 Acute upper respiratory infection, unspecified: Secondary | ICD-10-CM | POA: Diagnosis not present

## 2023-01-15 ENCOUNTER — Ambulatory Visit: Payer: Medicare HMO | Admitting: Emergency Medicine

## 2023-01-22 DIAGNOSIS — J9601 Acute respiratory failure with hypoxia: Secondary | ICD-10-CM | POA: Diagnosis not present

## 2023-01-29 ENCOUNTER — Encounter: Payer: Self-pay | Admitting: Internal Medicine

## 2023-01-29 ENCOUNTER — Ambulatory Visit: Payer: Medicare HMO

## 2023-01-29 ENCOUNTER — Ambulatory Visit: Payer: Medicare HMO | Admitting: Internal Medicine

## 2023-01-29 VITALS — BP 118/88 | HR 105 | Ht 60.0 in | Wt 271.0 lb

## 2023-01-29 DIAGNOSIS — R918 Other nonspecific abnormal finding of lung field: Secondary | ICD-10-CM | POA: Diagnosis not present

## 2023-01-29 DIAGNOSIS — R209 Unspecified disturbances of skin sensation: Secondary | ICD-10-CM | POA: Insufficient documentation

## 2023-01-29 DIAGNOSIS — E1121 Type 2 diabetes mellitus with diabetic nephropathy: Secondary | ICD-10-CM | POA: Insufficient documentation

## 2023-01-29 DIAGNOSIS — R0902 Hypoxemia: Secondary | ICD-10-CM | POA: Diagnosis not present

## 2023-01-29 DIAGNOSIS — J9601 Acute respiratory failure with hypoxia: Secondary | ICD-10-CM | POA: Diagnosis not present

## 2023-01-29 DIAGNOSIS — J069 Acute upper respiratory infection, unspecified: Secondary | ICD-10-CM

## 2023-01-29 MED ORDER — PANTOPRAZOLE SODIUM 40 MG PO TBEC: 40.0000 mg | DELAYED_RELEASE_TABLET | Freq: Every day | ORAL | 2 refills | Status: DC

## 2023-01-29 MED ORDER — FAMOTIDINE 20 MG PO TABS: ORAL_TABLET | ORAL | 11 refills | Status: DC

## 2023-01-29 NOTE — Progress Notes (Signed)
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    Also denies any obvious fluctuation of symptoms with weather or environmental changes or other aggravating or alleviating factors except as outlined above   No unusual exposure hx or h/o childhood pna/ asthma or knowledge of premature birth.  Current Allergies, Complete Past Medical History, Past Surgical History, Family History, and Social History were reviewed in [employer] record.  ROS  The following are not active complaints unless bolded Hoarseness, sore throat, dysphagia, dental problems, itching, sneezing,  nasal congestion or discharge of excess mucus or purulent secretions, ear ache,   fever, chills, sweats, unintended wt loss or wt gain, classically pleuritic or exertional cp,  orthopnea pnd or arm/hand swelling  or leg swelling, presyncope, palpitations, abdominal pain, anorexia, nausea, vomiting, diarrhea  or change in bowel habits or change in bladder habits, change in stools or change in urine, dysuria, hematuria,  rash, arthralgias, visual complaints, headache, numbness, weakness or ataxia or problems with walking or  coordination,  change in mood or  memory.               Outpatient Medications Prior to Visit  Medication Sig Dispense Refill   acetaminophen (TYLENOL) 500 MG tablet Take 1,000 mg by mouth every 6 (six) hours as needed for moderate pain.     acetaZOLAMIDE (DIAMOX) 250 MG tablet Take 250 mg by mouth every morning.     benztropine (COGENTIN) 1 MG tablet Take 1 mg by mouth at bedtime.     blood glucose meter kit and supplies Dispense based on patient and insurance preference. Use up to four times daily as directed. (FOR ICD-10 E10.9, E11.9). 1 each 0   dapagliflozin propanediol (FARXIGA) 5 MG TABS tablet Take 5 mg by mouth daily.     losartan (COZAAR) 100 MG tablet Take 100 mg by mouth daily.     metFORMIN (GLUCOPHAGE-XR) 750 MG 24 hr tablet Take 750 mg by mouth daily with breakfast.     nebivolol (BYSTOLIC) 2.5 MG tablet Take 2.5 mg by mouth daily.     pregabalin (LYRICA) 200 MG capsule TAKE 1 CAPSULE BY MOUTH TWICE A DAY 60 capsule 0   Spacer/Aero-Holding Chambers (AEROCHAMBER MV) inhaler Use as instructed 1 each 0   mometasone-formoterol (DULERA) 100-5 MCG/ACT AERO Inhale 2 puffs into the lungs 2 (two) times daily. (Patient not taking: Reported on 01/29/2023) 1 each 4   acetaZOLAMIDE ER (DIAMOX) 500 MG capsule Take 1 capsule (500 mg total) by mouth 2 (two) times daily. 60 capsule 0  metFORMIN (GLUCOPHAGE) 500 MG tablet Take 1 tablet (500 mg total) by mouth 2 (two) times daily with a meal. (Patient taking differently: Take 1,000 mg by mouth daily with breakfast.) 60 tablet 2   No facility-administered medications prior to visit.    Past Medical History:  Diagnosis Date   Diabetes mellitus without complication (HCC)    Obesity    Pneumonia       Objective:     BP 118/88   Pulse (!) 105   Ht 5' (1.524 m)   Wt 271 lb (122.9 kg)   LMP 10/28/2019 (Approximate)   SpO2 91%   BMI 52.93 kg/m   SpO2: 91 % RA  Talkative and certainly not toxic amb MO  (by BMI) bf nad   HEENT  :Mask in place    NECK :  without  apparent JVD/ palpable Nodes/TM  - classic pseudowheeze worse on insp    LUNGS: no acc muscle use,  Nl contour chest which is clear to A and P bilaterally without cough on insp or exp maneuvers   CV:  RRR  no s3 or murmur or increase in P2, and no edema   ABD:  obese soft and nontender    MS:  Nl gait/ ext warm without deformities Or obvious joint restrictions  calf tenderness, cyanosis or clubbing    SKIN: warm and dry without lesions    NEURO:  alert, approp, nl sensorium with  no motor or cerebellar deficits apparent.    CXR PA and Lateral:   01/29/2023 :    I personally reviewed images and impression is as follows:     Scarring in RML / lingula as per CT chest 12/05/22 as well     Assessment   Upper respiratory infection, acute Onset 01/21/23   No evidence of pneuonia/sepsis  or exacerbation of asthma - in fact all of her wheezing is upper airway in nature  Rec; Max rx for gerd  No chang asthma meds  Check covid status and call if POS Go to er if worse  Acute respiratory failure with hypoxia (HCC) Last bicarb level 28 so not likely very hypercabic but at risk of OHS   Rec Titrate 02 sats with supplemental 02 with goal > 90% sat          Each maintenance medication was reviewed in detail including emphasizing most importantly the difference between maintenance and prns and under what circumstances the prns are to be triggered using an action plan format where appropriate.  Total time for H and P, chart review, counseling, reviewing hfa/ 02 / pulse ox  device(s) and generating customized AVS unique to this office visit / same day charting = 30 min with pt new to me           Regina Hughs, MD 01/29/2023

## 2023-01-29 NOTE — Assessment & Plan Note (Addendum)
Onset 01/21/23   No evidence of pneuonia/sepsis  or exacerbation of asthma - in fact all of her wheezing is upper airway in nature  Rec; Max rx for gerd  No chang asthma meds  Check covid status and call if POS Go to er if worse

## 2023-01-29 NOTE — Assessment & Plan Note (Signed)
Last bicarb level 28 so not likely very hypercabic but at risk of OHS   Rec Titrate 02 sats with supplemental 02 with goal > 90% sat          Each maintenance medication was reviewed in detail including emphasizing most importantly the difference between maintenance and prns and under what circumstances the prns are to be triggered using an action plan format where appropriate.  Total time for H and P, chart review, counseling, reviewing hfa/ 02 / pulse ox  device(s) and generating customized AVS unique to this office visit / same day charting = 30 min with pt new to me

## 2023-01-29 NOTE — Patient Instructions (Addendum)
Stay home and  check your covid Test  - call if positive but we can't use paxlovid at this point   Adjust 02 level  to keep it above 90%   Pantoprazole (protonix) 40 mg   Take  30-60 min before first meal of the day and Pepcid (famotidine)  20 mg after supper until return to office - this is the best way to tell whether stomach acid is contributing to your problem.  GERD (REFLUX)  is an extremely common cause of respiratory symptoms just like yours , many times with no obvious heartburn at all.    It can be treated with medication, but also with lifestyle changes including elevation of the head of your bed (ideally with 6 -8inch blocks under the headboard of your bed),  Smoking cessation, avoidance of late meals, excessive alcohol, and avoid fatty foods, chocolate, peppermint, colas, red wine, and acidic juices such as orange juice.  NO MINT OR MENTHOL PRODUCTS SO NO COUGH DROPS  USE SUGARLESS CANDY INSTEAD (Jolley ranchers or Stover's or Life Savers) or even ice chips will also do - the key is to swallow to prevent all throat clearing. NO OIL BASED VITAMINS - use powdered substitutes.  Avoid fish oil when coughing.     Please remember to go to the  x-ray department  for your tests - we will call you with the results when they are available    If condition worsens go to the ER.

## 2023-02-06 ENCOUNTER — Telehealth: Payer: Self-pay | Admitting: *Deleted

## 2023-02-06 DIAGNOSIS — H4710 Unspecified papilledema: Secondary | ICD-10-CM | POA: Diagnosis not present

## 2023-02-06 DIAGNOSIS — H04123 Dry eye syndrome of bilateral lacrimal glands: Secondary | ICD-10-CM | POA: Diagnosis not present

## 2023-02-06 NOTE — Telephone Encounter (Signed)
Dr Dione Booze office called to see what they should do about pt having IIH Pt has optic nerve pressure and having bad headaches. Per Dr Dione Booze office pt will need a LP informed RN pt was seen last Jul 2023  for Polyneuropathy  by Dr Lucia Gaskins  and the only medication was prescribed was lyrica for nerve pain from our office Pt has been getting diamox from the ED Doctors . Dr Dione Booze RN states she will send pt to ED to be evaluated today  and send a  urgent referral for IIH for patient  to see Dr Lucia Gaskins  RN thanked me for my help

## 2023-02-07 ENCOUNTER — Other Ambulatory Visit: Payer: Self-pay

## 2023-02-07 ENCOUNTER — Emergency Department (HOSPITAL_COMMUNITY): Payer: Medicare HMO

## 2023-02-07 ENCOUNTER — Emergency Department (HOSPITAL_COMMUNITY)
Admission: EM | Admit: 2023-02-07 | Discharge: 2023-02-07 | Disposition: A | Discharge status: Home / Self Care | Payer: Medicare HMO | Attending: Emergency Medicine | Admitting: Emergency Medicine

## 2023-02-07 ENCOUNTER — Encounter (HOSPITAL_COMMUNITY): Payer: Self-pay

## 2023-02-07 DIAGNOSIS — H4710 Unspecified papilledema: Secondary | ICD-10-CM | POA: Diagnosis not present

## 2023-02-07 DIAGNOSIS — Z79899 Other long term (current) drug therapy: Secondary | ICD-10-CM | POA: Insufficient documentation

## 2023-02-07 DIAGNOSIS — E66813 Obesity, class 3: Secondary | ICD-10-CM

## 2023-02-07 DIAGNOSIS — R739 Hyperglycemia, unspecified: Secondary | ICD-10-CM | POA: Diagnosis not present

## 2023-02-07 DIAGNOSIS — R4182 Altered mental status, unspecified: Secondary | ICD-10-CM | POA: Diagnosis present

## 2023-02-07 DIAGNOSIS — Z6841 Body Mass Index (BMI) 40.0 and over, adult: Secondary | ICD-10-CM | POA: Insufficient documentation

## 2023-02-07 DIAGNOSIS — H539 Unspecified visual disturbance: Secondary | ICD-10-CM | POA: Diagnosis present

## 2023-02-07 DIAGNOSIS — I10 Essential (primary) hypertension: Secondary | ICD-10-CM | POA: Diagnosis not present

## 2023-02-07 DIAGNOSIS — R29818 Other symptoms and signs involving the nervous system: Secondary | ICD-10-CM | POA: Diagnosis not present

## 2023-02-07 DIAGNOSIS — R519 Headache, unspecified: Secondary | ICD-10-CM

## 2023-02-07 DIAGNOSIS — G932 Benign intracranial hypertension: Secondary | ICD-10-CM | POA: Diagnosis not present

## 2023-02-07 DIAGNOSIS — E1165 Type 2 diabetes mellitus with hyperglycemia: Secondary | ICD-10-CM | POA: Diagnosis not present

## 2023-02-07 LAB — COMPREHENSIVE METABOLIC PANEL
ALT: 18 U/L (ref 0–44)
AST: 21 U/L (ref 15–41)
Albumin: 3.4 g/dL — ABNORMAL LOW (ref 3.5–5.0)
Alkaline Phosphatase: 91 U/L (ref 38–126)
Anion gap: 9 (ref 5–15)
BUN: 8 mg/dL (ref 6–20)
CO2: 32 mmol/L (ref 22–32)
Calcium: 9.1 mg/dL (ref 8.9–10.3)
Chloride: 97 mmol/L — ABNORMAL LOW (ref 98–111)
Creatinine, Ser: 0.81 mg/dL (ref 0.44–1.00)
GFR, Estimated: >60 mL/min (ref >60)
Glucose, Bld: 205 mg/dL — ABNORMAL HIGH (ref 70–99)
Potassium: 4.3 mmol/L (ref 3.5–5.1)
Sodium: 138 mmol/L (ref 135–145)
Total Bilirubin: 0.7 mg/dL (ref 0.3–1.2)
Total Protein: 7.6 g/dL (ref 6.5–8.1)

## 2023-02-07 LAB — I-STAT CHEM 8, ED
BUN: 8 mg/dL (ref 6–20)
Calcium, Ion: 1.08 mmol/L — ABNORMAL LOW (ref 1.15–1.40)
Chloride: 97 mmol/L — ABNORMAL LOW (ref 98–111)
Creatinine, Ser: 0.7 mg/dL (ref 0.44–1.00)
Glucose, Bld: 205 mg/dL — ABNORMAL HIGH (ref 70–99)
HCT: 49 % — ABNORMAL HIGH (ref 36.0–46.0)
Hemoglobin: 16.7 g/dL — ABNORMAL HIGH (ref 12.0–15.0)
Potassium: 4.1 mmol/L (ref 3.5–5.1)
Sodium: 139 mmol/L (ref 135–145)
TCO2: 31 mmol/L (ref 22–32)

## 2023-02-07 LAB — DIFFERENTIAL
Abs Immature Granulocytes: 0.04 10*3/uL (ref 0.00–0.07)
Basophils Absolute: 0.1 10*3/uL (ref 0.0–0.1)
Basophils Relative: 1 %
Eosinophils Absolute: 0 10*3/uL (ref 0.0–0.5)
Eosinophils Relative: 0 %
Immature Granulocytes: 0 %
Lymphocytes Relative: 29 %
Lymphs Abs: 3.2 10*3/uL (ref 0.7–4.0)
Monocytes Absolute: 0.6 10*3/uL (ref 0.1–1.0)
Monocytes Relative: 5 %
Neutro Abs: 7.2 10*3/uL (ref 1.7–7.7)
Neutrophils Relative %: 65 %

## 2023-02-07 LAB — CBC
HCT: 47.8 % — ABNORMAL HIGH (ref 36.0–46.0)
Hemoglobin: 14.1 g/dL (ref 12.0–15.0)
MCH: 24.3 pg — ABNORMAL LOW (ref 26.0–34.0)
MCHC: 29.5 g/dL — ABNORMAL LOW (ref 30.0–36.0)
MCV: 82.4 fL (ref 80.0–100.0)
Platelets: 226 10*3/uL (ref 150–400)
RBC: 5.8 MIL/uL — ABNORMAL HIGH (ref 3.87–5.11)
RDW: 16.6 % — ABNORMAL HIGH (ref 11.5–15.5)
WBC: 11.1 10*3/uL — ABNORMAL HIGH (ref 4.0–10.5)
nRBC: 0 % (ref 0.0–0.2)

## 2023-02-07 LAB — APTT: aPTT: 30 s (ref 24–36)

## 2023-02-07 LAB — PROTIME-INR
INR: 0.9 (ref 0.8–1.2)
Prothrombin Time: 12.5 s (ref 11.4–15.2)

## 2023-02-07 LAB — ETHANOL: Alcohol, Ethyl (B): <10 mg/dL (ref <10)

## 2023-02-07 MED ORDER — ACETAMINOPHEN 500 MG PO TABS
1000.0000 mg | ORAL_TABLET | Freq: Once | ORAL | Status: AC
  Administered 2023-02-07: 1000 mg via ORAL
  Filled 2023-02-07: qty 2

## 2023-02-07 MED ORDER — ONDANSETRON 8 MG PO TBDP: 8.0000 mg | ORAL_TABLET | Freq: Three times a day (TID) | ORAL | 0 refills | Status: AC | PRN

## 2023-02-07 MED ORDER — ACETAZOLAMIDE 250 MG PO TABS: 500.0000 mg | ORAL_TABLET | Freq: Two times a day (BID) | ORAL | 0 refills | Status: DC

## 2023-02-07 MED ORDER — SODIUM CHLORIDE 0.9% FLUSH: 3.0000 mL | Freq: Once | INTRAVENOUS | Status: DC

## 2023-02-07 MED ORDER — ACETAZOLAMIDE 250 MG PO TABS
500.0000 mg | ORAL_TABLET | Freq: Once | ORAL | Status: AC
  Administered 2023-02-07: 500 mg via ORAL
  Filled 2023-02-07: qty 2

## 2023-02-07 NOTE — ED Triage Notes (Addendum)
Pt c/o Hax2wks. Pt wears 3L 02 per [place] at baseline. Pt sent by [facility] for increased papilledema OU, HA, blurred vision to r/o IIH. Ophthalmologist recommend MRI and neurohospitalist consult.

## 2023-02-07 NOTE — ED Provider Notes (Addendum)
[facility] [place] Provider Note   CSN: [id number] Arrival date & time: 02/07/23  1152     History  Chief Complaint  Patient presents with   Headache    Regina Wood is a 42 y.o. female.  Pt with history idiopathic intracranial htn, hx papilledema, c/o intermittent headaches in past 1-2 weeks, frontal, not necessarily positional. Similar to prior headaches. No abrupt, acute or severe head pain. No neck pain or stiffness. No sinus pain or drainage. No uri symptoms. No fever or chills. No recent head trauma or fall. No new numbness/weakness. No new change in speech or vision. No problems w balance. No fever or chills. Compliant w home meds. Has seen neurology and ophthy for same, including seeing ophthy APP today.  Had prior MRI and LP for same. Is on diamox, but v low dose. No eye pain or change in vision. No neck stiffness or rigidity.   The history is provided by the patient, a parent and medical records. The history is limited by the condition of the patient.  Headache Associated symptoms: no abdominal pain, no cough, no eye pain, no fever, no neck pain, no neck stiffness, no numbness, no sore throat, no vomiting and no weakness        Home Medications Prior to Admission medications   Medication Sig Start Date End Date Taking? Authorizing Provider  acetaminophen (TYLENOL) 500 MG tablet Take 1,000 mg by mouth every 6 (six) hours as needed for moderate pain.    [provider]  acetaZOLAMIDE (DIAMOX) 250 MG tablet Take 250 mg by mouth every morning. 12/30/22   [provider]  benztropine (COGENTIN) 1 MG tablet Take 1 mg by mouth at bedtime.    [provider]  blood glucose meter kit and supplies Dispense based on patient and insurance preference. Use up to four times daily as directed. (FOR ICD-10 E10.9, E11.9). 04/24/19   Noralee Stain, DO  dapagliflozin propanediol (FARXIGA) 5 MG TABS tablet Take 5 mg by mouth  daily. 06/11/21   [provider]  famotidine (PEPCID) 20 MG tablet One after supper 01/29/23   Nyoka Cowden, MD  losartan (COZAAR) 100 MG tablet Take 100 mg by mouth daily. 10/05/21   [provider]  metFORMIN (GLUCOPHAGE-XR) 750 MG 24 hr tablet Take 750 mg by mouth daily with breakfast. 12/12/22   [provider]  mometasone-formoterol (DULERA) 100-5 MCG/ACT AERO Inhale 2 puffs into the lungs 2 (two) times daily. Patient not taking: Reported on 01/29/2023 04/19/22   Leslye Peer, MD  nebivolol (BYSTOLIC) 2.5 MG tablet Take 2.5 mg by mouth daily. 11/19/22   [provider]  pantoprazole (PROTONIX) 40 MG tablet Take 1 tablet (40 mg total) by mouth daily. Take 30-60 min before first meal of the day 01/29/23   Nyoka Cowden, MD  pregabalin (LYRICA) 200 MG capsule TAKE 1 CAPSULE BY MOUTH TWICE A DAY 01/14/23   Anson Fret, MD  Spacer/Aero-Holding Chambers (AEROCHAMBER MV) inhaler Use as instructed 04/19/22   Leslye Peer, MD      Allergies    Patient has no known allergies.    Review of Systems   Review of Systems  Constitutional:  Negative for chills and fever.  HENT:  Negative for sinus pain and sore throat.   Eyes:  Negative for pain, redness and visual disturbance.  Respiratory:  Negative for cough and shortness of breath.   Cardiovascular:  Negative for chest pain.  Gastrointestinal:  Negative for abdominal pain and vomiting.  Genitourinary:  Negative for flank pain.  Musculoskeletal:  Negative for neck pain and neck stiffness.  Skin:  Negative for rash.  Neurological:  Positive for headaches. Negative for speech difficulty, weakness and numbness.    Physical Exam Updated Vital Signs BP 132/83 (BP Location: Left Arm)   Pulse 78   Temp 98.1 F (36.7 C) (Oral)   Resp 17   Ht 1.524 m (5')   Wt 122.9 kg   LMP 10/28/2019 (Approximate)   SpO2 99%   BMI 52.92 kg/m  Physical Exam Vitals and nursing note reviewed.  Constitutional:       Appearance: Normal appearance. She is well-developed.  HENT:     Head: Atraumatic.     Comments: No sinus or temporal tenderness.     Nose: Nose normal.     Mouth/Throat:     Mouth: Mucous membranes are moist.  Eyes:     General: No scleral icterus.    Extraocular Movements: Extraocular movements intact.     Conjunctiva/sclera: Conjunctivae normal.     Pupils: Pupils are equal, round, and reactive to light.     Comments: ?papilledema.   Neck:     Trachea: No tracheal deviation.     Comments: No stiffness or rigidity. Normal rom.  Cardiovascular:     Rate and Rhythm: Normal rate and regular rhythm.     Pulses: Normal pulses.     Heart sounds: Normal heart sounds. No murmur heard.    No friction rub. No gallop.  Pulmonary:     Effort: Pulmonary effort is normal. No respiratory distress.     Breath sounds: Normal breath sounds.  Abdominal:     General: There is no distension.     Palpations: Abdomen is soft.     Tenderness: There is no abdominal tenderness.  Genitourinary:    Comments: No cva tenderness.  Musculoskeletal:        General: No swelling or tenderness.     Cervical back: Normal range of motion and neck supple. No rigidity. No muscular tenderness.  Skin:    General: Skin is warm and dry.     Findings: No rash.  Neurological:     Mental Status: She is alert.     Comments: Alert, speech normal. Motor/sens grossly intact bil. Steady gait.   Psychiatric:        Mood and Affect: Mood normal.     ED Results / Procedures / Treatments   Labs (all labs ordered are listed, but only abnormal results are displayed) Results for orders placed or performed during the hospital encounter of 02/07/23  Protime-INR  Result Value Ref Range   Prothrombin Time 12.5 11.4 - 15.2 seconds   INR 0.9 0.8 - 1.2  APTT  Result Value Ref Range   aPTT 30 24 - 36 seconds  CBC  Result Value Ref Range   WBC 11.1 (H) 4.0 - 10.5 K/uL   RBC 5.80 (H) 3.87 - 5.11 MIL/uL   Hemoglobin 14.1  12.0 - 15.0 g/dL   HCT 91.4 (H) 78.2 - 95.6 %   MCV 82.4 80.0 - 100.0 fL   MCH 24.3 (L) 26.0 - 34.0 pg   MCHC 29.5 (L) 30.0 - 36.0 g/dL   RDW 21.3 (H) 08.6 - 57.8 %   Platelets 226 150 - 400 K/uL   nRBC 0.0 0.0 - 0.2 %  Differential  Result Value Ref Range   Neutrophils Relative % 65 %  Neutro Abs 7.2 1.7 - 7.7 K/uL   Lymphocytes Relative 29 %   Lymphs Abs 3.2 0.7 - 4.0 K/uL   Monocytes Relative 5 %   Monocytes Absolute 0.6 0.1 - 1.0 K/uL   Eosinophils Relative 0 %   Eosinophils Absolute 0.0 0.0 - 0.5 K/uL   Basophils Relative 1 %   Basophils Absolute 0.1 0.0 - 0.1 K/uL   Immature Granulocytes 0 %   Abs Immature Granulocytes 0.04 0.00 - 0.07 K/uL  Comprehensive metabolic panel  Result Value Ref Range   Sodium 138 135 - 145 mmol/L   Potassium 4.3 3.5 - 5.1 mmol/L   Chloride 97 (L) 98 - 111 mmol/L   CO2 32 22 - 32 mmol/L   Glucose, Bld 205 (H) 70 - 99 mg/dL   BUN 8 6 - 20 mg/dL   Creatinine, Ser 5.36 0.44 - 1.00 mg/dL   Calcium 9.1 8.9 - 64.4 mg/dL   Total Protein 7.6 6.5 - 8.1 g/dL   Albumin 3.4 (L) 3.5 - 5.0 g/dL   AST 21 15 - 41 U/L   ALT 18 0 - 44 U/L   Alkaline Phosphatase 91 38 - 126 U/L   Total Bilirubin 0.7 0.3 - 1.2 mg/dL   GFR, Estimated >03 >47 mL/min   Anion gap 9 5 - 15  Ethanol  Result Value Ref Range   Alcohol, Ethyl (B) <10 <10 mg/dL  I-stat chem 8, ED  Result Value Ref Range   Sodium 139 135 - 145 mmol/L   Potassium 4.1 3.5 - 5.1 mmol/L   Chloride 97 (L) 98 - 111 mmol/L   BUN 8 6 - 20 mg/dL   Creatinine, Ser 4.25 0.44 - 1.00 mg/dL   Glucose, Bld 956 (H) 70 - 99 mg/dL   Calcium, Ion 3.87 (L) 1.15 - 1.40 mmol/L   TCO2 31 22 - 32 mmol/L   Hemoglobin 16.7 (H) 12.0 - 15.0 g/dL   HCT 56.4 (H) 33.2 - 95.1 %   CT HEAD WO CONTRAST  Result Date: 02/07/2023 CLINICAL DATA:  Neuro deficit, acute, stroke suspected EXAM: CT HEAD WITHOUT CONTRAST TECHNIQUE: Contiguous axial images were obtained from the base of the skull through the vertex without intravenous  contrast. RADIATION DOSE REDUCTION: This exam was performed according to the departmental dose-optimization program which includes automated exposure control, adjustment of the mA and/or kV according to patient size and/or use of iterative reconstruction technique. COMPARISON:  CT Head 06/05/22 FINDINGS: Brain: No evidence of acute infarction, hemorrhage, hydrocephalus, extra-axial collection or mass lesion/mass effect.Partially empty sella. Vascular: No hyperdense vessel or unexpected calcification. Skull: Normal. Negative for fracture or focal lesion. Sinuses/Orbits: No middle ear or mastoid effusion. Paranasal sinuses are clear. Orbits are unremarkable. Other: None. IMPRESSION: No acute intracranial abnormality. Electronically Signed   By: Lorenza Cambridge M.D.   On: 02/07/2023 14:32    EKG EKG Interpretation Date/Time:  Friday February 07 2023 16:40:34 EDT Ventricular Rate:  80 PR Interval:  126 QRS Duration:  86 QT Interval:  363 QTC Calculation: 419 R Axis:   67  Text Interpretation: Sinus rhythm Confirmed by Cathren Laine ([id number]) on 02/07/2023 5:38:26 PM  Radiology CT HEAD WO CONTRAST  Result Date: 02/07/2023 CLINICAL DATA:  Neuro deficit, acute, stroke suspected EXAM: CT HEAD WITHOUT CONTRAST TECHNIQUE: Contiguous axial images were obtained from the base of the skull through the vertex without intravenous contrast. RADIATION DOSE REDUCTION: This exam was performed according to the departmental dose-optimization program which includes automated exposure control, adjustment  of the mA and/or kV according to patient size and/or use of iterative reconstruction technique. COMPARISON:  CT Head 06/05/22 FINDINGS: Brain: No evidence of acute infarction, hemorrhage, hydrocephalus, extra-axial collection or mass lesion/mass effect.Partially empty sella. Vascular: No hyperdense vessel or unexpected calcification. Skull: Normal. Negative for fracture or focal lesion. Sinuses/Orbits: No middle ear or mastoid  effusion. Paranasal sinuses are clear. Orbits are unremarkable. Other: None. IMPRESSION: No acute intracranial abnormality. Electronically Signed   By: Lorenza Cambridge M.D.   On: 02/07/2023 14:32    Procedures Procedures    Medications Ordered in ED Medications  acetaminophen (TYLENOL) tablet 1,000 mg (has no administration in time range)  acetaZOLAMIDE (DIAMOX) tablet 500 mg (has no administration in time range)    ED Course/ Medical Decision Making/ A&P                                 Medical Decision Making Problems Addressed: Class 3 obesity: chronic illness or injury that poses a threat to life or bodily functions Frontal headache: acute illness or injury Hyperglycemia: acute illness or injury Idiopathic intracranial hypertension: chronic illness or injury with exacerbation, progression, or side effects of treatment that poses a threat to life or bodily functions Papilledema: acute illness or injury with systemic symptoms that poses a threat to life or bodily functions  Amount and/or Complexity of Data Reviewed Independent Historian: parent    Details: hx External Data Reviewed: notes. Labs: ordered. Decision-making details documented in ED Course. Radiology: ordered and independent interpretation performed. Decision-making details documented in ED Course. ECG/medicine tests: ordered and independent interpretation performed. Decision-making details documented in ED Course.  Risk OTC drugs. Prescription drug management. Decision regarding hospitalization.   Labs ordered/sent.  Differential diagnosis includes IIH, papilledema, etc. Dispo decision including potential need for admission considered - will get labs and imaging and reassess.   Reviewed nursing notes and prior charts for additional history. External reports reviewed. Additional history from: family.   Cardiac monitor: sinus rhythm, rate 78.  Labs reviewed/interpreted by me - glucose mildly elevated, other  chem unremarkable. Hgb normal.   CT reviewed/interpreted by me - no hem or acute process.  Neurology consulted, discussed w Dr Otelia Limes. He indicates no indication for recurrent or serial LP. He indicates would increase diamox to 500 mg bid, and have f/u with neurology as outpatient. Also rec pursuing wt loss process as outpt.  Discussed w Dr Laruth Bouchard, ophthy, also agreeable with plan to increase diamox, outpt f/u.    Acetaminophen po. Diamox po.   Rec close pcp and neurology  f/u.  Return precautions provided.          Final Clinical Impression(s) / ED Diagnoses Final diagnoses:  Idiopathic intracranial hypertension  Frontal headache  Papilledema  Hyperglycemia  Class 3 obesity    Rx / DC Orders ED Discharge Orders     None          Cathren Laine, MD 02/07/23 [phone]

## 2023-02-07 NOTE — Discharge Instructions (Addendum)
It was our pleasure to provide your ER care today - we hope that you feel better.  We discussed your case with our neurologist - he indicates to increase your diamox dose to 500 mg 2x/day.  It is also important to pursue a weight loss program to help these symptoms and condition - discuss possible weight loss program or GLP-1 medication therapy with your primary care doctor in the coming week. Also , follow up closely with neurologist in the next 1-2 weeks - call office Monday AM to arrange appointment.   You may take zofran as need for nausea. Drink adequate fluids/stay well hydrated.   Return to ER if worse, new symptoms, fevers, severe pain, change in vision, new numbness/weakness, or other concern.

## 2023-02-10 DIAGNOSIS — J9601 Acute respiratory failure with hypoxia: Secondary | ICD-10-CM | POA: Diagnosis not present

## 2023-02-10 DIAGNOSIS — J069 Acute upper respiratory infection, unspecified: Secondary | ICD-10-CM | POA: Diagnosis not present

## 2023-02-10 DIAGNOSIS — R6250 Unspecified lack of expected normal physiological development in childhood: Secondary | ICD-10-CM | POA: Diagnosis not present

## 2023-02-11 DIAGNOSIS — F429 Obsessive-compulsive disorder, unspecified: Secondary | ICD-10-CM | POA: Diagnosis not present

## 2023-02-18 ENCOUNTER — Telehealth: Payer: Self-pay

## 2023-02-18 NOTE — Telephone Encounter (Signed)
I left a message for the patient to return my call.

## 2023-02-19 ENCOUNTER — Telehealth: Payer: Self-pay | Admitting: Neurology

## 2023-02-19 ENCOUNTER — Encounter: Payer: Self-pay | Admitting: Neurology

## 2023-02-19 ENCOUNTER — Ambulatory Visit (INDEPENDENT_AMBULATORY_CARE_PROVIDER_SITE_OTHER): Payer: Medicare HMO | Admitting: Neurology

## 2023-02-19 VITALS — BP 102/76 | HR 93 | Ht <= 58 in | Wt 264.0 lb

## 2023-02-19 DIAGNOSIS — H4710 Unspecified papilledema: Secondary | ICD-10-CM

## 2023-02-19 DIAGNOSIS — R519 Headache, unspecified: Secondary | ICD-10-CM

## 2023-02-19 DIAGNOSIS — G932 Benign intracranial hypertension: Secondary | ICD-10-CM | POA: Diagnosis not present

## 2023-02-19 DIAGNOSIS — H547 Unspecified visual loss: Secondary | ICD-10-CM | POA: Diagnosis not present

## 2023-02-19 DIAGNOSIS — G08 Intracranial and intraspinal phlebitis and thrombophlebitis: Secondary | ICD-10-CM | POA: Diagnosis not present

## 2023-02-19 MED ORDER — TOPIRAMATE 100 MG PO TABS: ORAL_TABLET | ORAL | 6 refills | Status: DC

## 2023-02-19 NOTE — Progress Notes (Signed)
[facility]    Provider:  Dr Lucia Gaskins Requesting Provider: Alma Downs, [place] Primary Care Provider:  Melida Quitter, MD  CC:  papilledema,   02/22/2023: Patient with papilledema dxed with IDIOPATHIC INTRACRANIAL HYPERTENSION here for urgent referral from Dr. Dione Booze, reviewed his notes she has papilledema, prior imaging and opening pressure consistent with IDIOPATHIC INTRACRANIAL HYPERTENSION. Here owth parents who are concerned, she has vision loss, She cannot tolerate acetazolamide. The medication makes her sick. She was prescribed 500mg  twice a day but she can only tolerate 250mg  a day. She is cognitively impaired, she gets very upset at the mention of surgery, discussed risks of permanent blindness, other options such as neurosurgery, optic nerve fenestration, LPs, other medication and significant risk of blindness. Had an extended conversation about iih and preovided reading materials I ma very concerned but surgery is not an immediate options after discussions with family due to patient's wishes.   Reviewed notes, labs and imaging from outside physicians, which showed:   MRI brain w/wo: 06/05/2022:  IMPRESSION: 1. Evaluation is limited by motion artifact. Within this limitation, no acute intracranial process or acute orbital abnormality. 2. Partial empty sella, which is nonspecific but can be seen in the setting of idiopathic intracranial hypertension. Evaluation for additional findings related to intracranial hypertension is limited by motion artifact. Correlate with symptoms and consider lumbar puncture with opening pressure if clinically indicated. Patient complains of symptoms per HPI as well as the following symptoms: vision loss, cognitive impaitrment . Pertinent negatives and positives per HPI. All others negative  06/06/2022: LP CLINICAL DATA:  42 year female with papilledema, intracranial hypertension suspected. Request for image-guided lumbar puncture.    EXAM: LUMBAR PUNCTURE UNDER FLUOROSCOPY   PROCEDURE: An appropriate skin entry site was determined fluoroscopically. Operator donned sterile gloves and mask. Skin site was marked, then prepped with Betadine, draped in usual sterile fashion, and infiltrated locally with 1% lidocaine. A 20 gauge spinal needle advanced into the thecal sac at L4-L5 from a left interlaminar approach.   Clear colorless CSF spontaneously returned, with opening pressure of 24.5 cm water. 15 ml CSF were collected and divided among 4 sterile vials for the requested laboratory studies.   Closing pressure 15.5 cm of water.   The needle was then removed. The patient tolerated the procedure well without signs of immediate postprocedure complication.   FLUOROSCOPY: Radiation Exposure Index (as provided by the fluoroscopic device): 41.5 mGy Kerma   IMPRESSION: Technically successful lumbar puncture under fluoroscopy.   This exam was performed by Loyce Dys PA-C, and was supervised and interpreted by Donzetta Kohut, MD.    HPI:  Regina Wood is a 42 y.o. female here as requested by Alma Downs, PA for tingling in hands, feet and legs. Reviewed Dr. Sigurd Sos notes: failed gabapentin and lyrica, has developmental delay, diabetes, diabetic nephropathy. Had emg/ncs?  I reviewed Laurelyn Sickle notes, complaining of foot leg and hands hurting and wondering if she should be seen, has not gotten any better with medication changes, she is crying a lot, Also reviewed orthopedics notes Dr. Merlyn Lot March 2023 who presented with her mother, diagnosed with intellectual disability, complaints of bilateral hand pain and numbness and tingling, right worse than left, at that time approximately ongoing 2 months, no injuries, she was diagnosed by Dr. Merlyn Lot with bilateral carpal tunnel syndrome and they chose to try splints from the options they discussed.  [name] states that a CTS test was negative however I do not see that  information or any EMG nerve conduction study.  The patient is intellectually disabled and unable to provide lots of details, she complains of low back pain, elbow and knee pain, also has hypertension and has a psychiatrist, she is morbidly obese.  Patient states she has pain in the heel and whole foot like they tingle and are asleep, numbness. Mostly at night in the feet. She can go to sleep but they hurt in the morning before getting out of bed, she complains during the day as well, a few months. She was told to supplement b12 so likely B12 deficiency and diabetes. I don't have a b12 value but they will see Dr. Victorino Dike this week and can also ask about thyroid.   Hands: in the thumb and 1st 3 fingers . Saw Dr. Merlyn Lot, diagnosed with CTS, and the splints didn't fit.  First 3 fingers. We discussed Dr. Merrilee Seashore evaluation and that she was diagnosed with CTS. We reviewed CTS. Mother had CTS and release. She has not been wearing the wrist splints at night. Discussed emg/ncs. Also offered OT but mother would like to make sure it is CTS will send back to Dr. Merlyn Lot for emg/ncs which he recommended anyway.  Reviewed notes, labs and imaging from outside physicians, which showed:  Labs collected include B12, BMP with elevated glucose otherwise normal with BUN 70 creatinine 0.7, A1c 6.9, I do not see B12 value in the notes.  Review of Systems: Patient complains of symptoms per HPI as well as the following symptoms pain. Pertinent negatives and positives per HPI. All others negative.   Social History   Socioeconomic History   Marital status: Single    Spouse name: Not on file   Number of children: Not on file   Years of education: Not on file   Highest education level: Not on file  Occupational History   Not on file  Tobacco Use   Smoking status: Never   Smokeless tobacco: Never  Vaping Use   Vaping status: Never Used  Substance and Sexual Activity   Alcohol use: Yes    Comment: beer occ   Drug use:  Never   Sexual activity: Not on file  Other Topics Concern   Not on file  Social History Narrative   Caffeine: in hot chocolate   Left handed   Lives at home with parents   Social Determinants of Health   Financial Resource Strain: Not on file  Food Insecurity: Not on file  Transportation Needs: Not on file  Physical Activity: Not on file  Stress: Not on file  Social Connections: Not on file  Intimate Partner Violence: Not on file    Family History  Problem Relation Age of Onset   Healthy Mother    Migraines Mother    Hypertension Father    Diabetes Father    Neuropathy Neg Hx    Pseudotumor cerebri Neg Hx     Past Medical History:  Diagnosis Date   Diabetes mellitus without complication (HCC)    Obesity    Pneumonia     Patient Active Problem List   Diagnosis Date Noted   IIH (idiopathic intracranial hypertension) 02/22/2023   Papilledema 02/22/2023   Severe headache 02/22/2023   Vision loss 02/22/2023   Diabetic renal disease (HCC) 01/29/2023   Skin sensation disturbance 01/29/2023   Suspected sleep apnea 04/19/2022   Upper respiratory infection, acute 03/08/2022   Chronic respiratory failure (HCC) 03/07/2022   Abnormal CT of the chest 12/31/2019  Intellectual disability 07/07/2019   Morbid (severe) obesity due to excess calories (HCC) 07/07/2019   Acute respiratory failure with hypoxia (HCC) 04/21/2019   Pneumonia 04/21/2019   Hyperglycemia 04/21/2019   HTN (hypertension) 04/21/2019   Developmental delay 04/21/2019    Past Surgical History:  Procedure Laterality Date   NO PAST SURGERIES      Current Outpatient Medications  Medication Sig Dispense Refill   acetaminophen (TYLENOL) 500 MG tablet Take 1,000 mg by mouth every 6 (six) hours as needed for moderate pain.     acetaZOLAMIDE (DIAMOX) 250 MG tablet Take 2 tablets (500 mg total) by mouth 2 (two) times daily. 120 tablet 0   benztropine (COGENTIN) 1 MG tablet Take 1 mg by mouth at bedtime.      blood glucose meter kit and supplies Dispense based on patient and insurance preference. Use up to four times daily as directed. (FOR ICD-10 E10.9, E11.9). 1 each 0   dapagliflozin propanediol (FARXIGA) 5 MG TABS tablet Take 5 mg by mouth daily.     famotidine (PEPCID) 20 MG tablet One after supper 30 tablet 11   losartan (COZAAR) 100 MG tablet Take 100 mg by mouth daily.     metFORMIN (GLUCOPHAGE-XR) 750 MG 24 hr tablet Take 750 mg by mouth daily with breakfast.     mometasone-formoterol (DULERA) 100-5 MCG/ACT AERO Inhale 2 puffs into the lungs 2 (two) times daily. 1 each 4   nebivolol (BYSTOLIC) 2.5 MG tablet Take 2.5 mg by mouth daily.     ondansetron (ZOFRAN-ODT) 8 MG disintegrating tablet Take 1 tablet (8 mg total) by mouth every 8 (eight) hours as needed for nausea or vomiting. 10 tablet 0   pantoprazole (PROTONIX) 40 MG tablet Take 1 tablet (40 mg total) by mouth daily. Take 30-60 min before first meal of the day 30 tablet 2   pregabalin (LYRICA) 200 MG capsule TAKE 1 CAPSULE BY MOUTH TWICE A DAY 60 capsule 0   Spacer/Aero-Holding Chambers (AEROCHAMBER MV) inhaler Use as instructed 1 each 0   topiramate (TOPAMAX) 100 MG tablet Start with 1 pill at bedtime. Then in 2 weeks if no side effects can increase to 2 pills at bedtime. 60 tablet 6   No current facility-administered medications for this visit.    Allergies as of 02/19/2023   (No Known Allergies)    Vitals: BP 102/76 (BP Location: Right Arm, Patient Position: Sitting, Cuff Size: Large)   Pulse 93   Ht 4\' 9"  (1.448 m)   Wt 264 lb (119.7 kg)   LMP 10/28/2019 (Approximate)   BMI 57.13 kg/m  Last Weight:  Wt Readings from Last 1 Encounters:  02/19/23 264 lb (119.7 kg)   Last Height:   Ht Readings from Last 1 Encounters:  02/19/23 4\' 9"  (1.448 m)   Physical exam: Exam: Gen: NAD, conversant, well nourised, morbidly obese, well groomed                     CV: RRR, no MRG. No Carotid Bruits. No peripheral edema, warm,  nontender Eyes: Conjunctivae clear without exudates or hemorrhage  Neuro: Detailed Neurologic Exam  Speech:    Speech is normal; fluent .  Cognition:   Cognitive impairment Cranial Nerves:    The pupils are equal, round, and reactive to light. Papilledema. Visual fields are impaired to finger confrontation. Extraocular movements are intact. Trigeminal sensation is intact and the muscles of mastication are normal. The face is symmetric. The palate elevates in the midline.  Hearing intact. Voice is normal. Shoulder shrug is normal. The tongue has normal motion without fasciculations.   Coordination: No attaxia noted  Gait: No ataxia, wide based  Motor Observation:    No asymmetry, no atrophy, and no involuntary movements noted. Tone:    Normal muscle tone.    Posture:    Posture is normal. normal erect    Strength:    Strength is equal and antigravity in the upper and lower limbs.      Sensation: intact to LT     Reflex Exam:  DTR's:    Deep tendon reflexes in the upper and lower extremities are symmetrical bilaterally.   Toes:    The toes are downgoing bilaterally.   Clonus:    Clonus is absent.   Assessment/Plan:  Papilledema and IDIOPATHIC INTRACRANIAL HYPERTENSION. Patient with papilledema dxed with IDIOPATHIC INTRACRANIAL HYPERTENSION here for urgent referral from Dr. Dione Booze, reviewed his notes she has papilledema, prior imaging and opening pressure consistent with IDIOPATHIC INTRACRANIAL HYPERTENSION. Here owth parents who are concerned, she has vision loss, She cannot tolerate acetazolamide. The medication makes her sick. She was prescribed 500mg  twice a day but she can only tolerate 250mg  a day. She is cognitively impaired, she gets very upset at the mention of surgery, discussed risks of permanent blindness, other options such as neurosurgery, optic nerve fenestration, LPs, other medication and significant risk of blindness. Had an extended conversation about iih and  preovided reading materials I ma very concerned but surgery is not an immediate options after discussions with family due to patient's wishes.  Start Topiramate. Start with one pill and if no side effects increase to 2 pills at bedtime.  CT Scan of the Veins Lumbar Puncture Refer to the healthy weight and wellness center  Meds ordered this encounter  Medications   topiramate (TOPAMAX) 100 MG tablet    Sig: Start with 1 pill at bedtime. Then in 2 weeks if no side effects can increase to 2 pills at bedtime.    Dispense:  60 tablet    Refill:  6   Orders Placed This Encounter  Procedures   CT VENOGRAM HEAD   DG FL GUIDED LUMBAR PUNCTURE   Ambulatory referral to [facility]   Lumbar puncture for opening pressure and labs.   Weight loss is critical, discussed weight loss and risk of permanent vision loss   For any acute change especially worsening headache or vision loss call 911 and proceed to ED  To prevent or relieve headaches, try the following: Cool Compress. Lie down and place a cool compress on your head.  Avoid headache triggers. If certain foods or odors seem to have triggered your migraines in the past, avoid them. A headache diary might help you identify triggers.  Include physical activity in your daily routine. Try a daily walk or other moderate aerobic exercise.  Manage stress. Find healthy ways to cope with the stressors, such as delegating tasks on your to-do list.  Practice relaxation techniques. Try deep breathing, yoga, massage and visualization.  Eat regularly. Eating regularly scheduled meals and maintaining a healthy diet might help prevent headaches. Also, drink plenty of fluids.  Follow a regular sleep schedule. Sleep deprivation might contribute to headaches Consider biofeedback. With this mind-body technique, you learn to control certain bodily functions -- such as muscle tension, heart rate and blood pressure -- to prevent headaches or reduce headache pain.     Proceed to emergency room if you experience new or worsening symptoms  or symptoms do not resolve, if you have new neurologic symptoms or if headache is severe, or for any concerning symptom.   Provided education and documentation from American headache Society toolbox including articles on: pseudotumoer cerebri(IIH), chronic migraine medication overuse headache, chronic migraines, prevention of migraines and other headaches, behavioral and other nonpharmacologic treatments for headache.   PRIOR A/P 11/2021:  Hands:  +Mcphalen's and +Tinels at the wrists. Has Seen Ortho dxed with CTS: Per Dr. Merlyn Lot copied from his notes "Plan: I discussed with Sheppard Coil and her mother the nature of carpal tunnel syndrome. We discussed treatment options including acceptance, day and/or nighttime splinting, injection of the carpal tunnel, and carpal tunnel release. We discussed potential further evaluation with nerve conduction studies. At this time she and her mother would like to try wrist splints and see if this provides some relief. I think this is reasonable. She was provided splints for use as needed. I will see her back on a as needed basis. If they note any worsening of symptoms or failure to improve I will be happy to see her back. They agree with the plan of care."   - My recommendation for the hands would be to return to Dr. Merlyn Lot and get EMG/NCS and pursue treatment. I will send referral back to his office for emg/ncs and further treatment.  Feet: Patient states she has pain in the heel and whole foot like they tingle and are asleep, numbness. Mostly at night in the feet. She can go to sleep but they hurt in the morning before getting out of bed and all day. She was told to supplement b12 so maybe B12 deficiency?(I can't find the B12 value in the consult notes). and diabetes. I don't have a b12 value but they will see Dr. Victorino Dike this week and can also ask about thyroid as well and make sure that has been  completed at some point. I will test for a few more causes of neuropathy (by exam appears to be small-fiber). Increased Lyrica. Dr. Victorino Dike can further increase Lyrica as needed.  -May consider daily alpha lipoic acid which is an antioxidant that may reduce free radical oxidative stress associated with diabetic polyneuropathy, existing evidence suggests that alpha lipoic acid significantly reduces stabbing, lancinating and burning pain and diabetic neuropathy with its onset of action as early as 1-2 weeks. 400-600mg /day  - topical lidocaine or capsaicin on the feet  Orders Placed This Encounter  Procedures   CT VENOGRAM HEAD   DG FL GUIDED LUMBAR PUNCTURE   Ambulatory referral to [facility]   Meds ordered this encounter  Medications   topiramate (TOPAMAX) 100 MG tablet    Sig: Start with 1 pill at bedtime. Then in 2 weeks if no side effects can increase to 2 pills at bedtime.    Dispense:  60 tablet    Refill:  6    Cc: Alma Downs, [place],  Wile, Nyoka Cowden, MD  Naomie Dean, MD  [facility] [address] [place], [place] [address]  Phone [phone] Fax [phone]  I spent over 60 minutes of face-to-face and non-face-to-face time with patient on the  1. IIH (idiopathic intracranial hypertension)   2. Papilledema   3. Severe headache   4. Vision loss   5. Morbid obesity (HCC)   6. Intracranial and intraspinal phlebitis and thrombophlebitis     diagnosis.  This included previsit chart review, lab review, study review, order entry, electronic health record documentation, patient education on  the different diagnostic and therapeutic options, counseling and coordination of care, risks and benefits of management, compliance, or risk factor reduction

## 2023-02-19 NOTE — Telephone Encounter (Signed)
sent to [facility] they obtain [facility] auth: [id number] exp. 02/19/23-04/20/23 [phone]

## 2023-02-19 NOTE — Patient Instructions (Addendum)
Start Topiramate. Start with one pill and if no side effects increase to 2 pills at bedtime.  CT Scan of the Veins Lumbar Puncture Refer to the healthy weight and wellness center  Idiopathic Intracranial Hypertension  Idiopathic intracranial hypertension (IIH) is a condition that increases pressure around the brain. The fluid that surrounds the brain and spinal cord (cerebrospinal fluid, or CSF) increases and causes the pressure. Idiopathic means that the cause of this condition is not known. IIH affects the brain and spinal cord. If this condition is not treated, it can cause vision loss or blindness. What are the causes? The cause of this condition is not known. What increases the risk? The following factors may make you more likely to develop this condition: Being obese. Being a person who is female, between the ages of 68 and 28 years old, and who has not gone through menopause. Taking certain medicines, such as birth control, acne medicines, or steroids. What are the signs or symptoms? Symptoms of this condition include: Headaches. This is the most common symptom. Brief periods of total blindness. Double vision, blurred vision, or poor side (peripheral) vision. Pain in the shoulders or neck. Nausea and vomiting. A sound like rushing water or a pulsing sound within the ears (pulsatile tinnitus), or ringing in the ears. How is this diagnosed? This condition may be diagnosed based on: Your symptoms and medical history. Imaging tests of the brain, such as: CT scan. MRI. Magnetic resonance venogram (MRV) to check the veins. Diagnostic lumbar puncture. This is a procedure to remove and examine a sample of CSF. This procedure can determine whether your fluid pressure is too high. An eye exam to check for swelling or nerve damage in the eyes. How is this treated? Treatment for this condition depends on the symptoms. The goal of treatment is to decrease the pressure around your brain.  Common treatments include: Weight loss through healthy eating, salt restriction, and exercise, if you are overweight. Medicines to decrease the production of CSF and lower the pressure within your skull. Medicines to prevent or treat headaches. Other treatments may include: Surgery to place drains (shunts) in your brain to remove extra fluid. Lumbar puncture to remove extra CSF. Follow these instructions at home: If you are overweight or obese, work with your health care provider to lose weight. Take over-the-counter and prescription medicines only as told by your health care provider. Ask your health care provider if the medicine prescribed to you requires you to avoid driving or using machinery. Do not use any products that contain nicotine or tobacco. These products include cigarettes, chewing tobacco, and vaping devices, such as e-cigarettes. If you need help quitting, ask your health care provider. Keep all follow-up visits. Your health care provider will need to monitor you regularly. Contact a health care provider if: You have changes in your vision, such as: Double vision. Blurred vision. Poor peripheral vision. Get help right away if: You have any of the following symptoms and they get worse or do not get better: Headaches. Nausea. Vomiting. Sudden trouble seeing. This information is not intended to replace advice given to you by your health care provider. Make sure you discuss any questions you have with your health care provider. Document Revised: 09/18/2021 Document Reviewed: 08/28/2021 Elsevier Patient Education  2024 [employer].

## 2023-02-19 NOTE — Telephone Encounter (Signed)
Ms. Regina Wood is retuning yesterday's (02/18/23) call from Annabell Sabal, CMA  regarding her daughter's test results  Call [phone]

## 2023-02-20 NOTE — Telephone Encounter (Signed)
Nyoka Cowden, MD 02/17/2023  8:25 AM EDT     Call pt:  Reviewed cxr and radiology concerned about a small area on R - if any darkening of sputum would go ahead and try doxy 100 mg bid x 10 days then repeat the cxr at 2 weeks, otherwise can repeat at next f/u which should be w/in 6 weeks at the latest    I called pt's daughter and there was no answer- LMTCB.

## 2023-02-21 DIAGNOSIS — J9601 Acute respiratory failure with hypoxia: Secondary | ICD-10-CM | POA: Diagnosis not present

## 2023-02-22 DIAGNOSIS — G932 Benign intracranial hypertension: Secondary | ICD-10-CM | POA: Insufficient documentation

## 2023-02-22 DIAGNOSIS — H4710 Unspecified papilledema: Secondary | ICD-10-CM | POA: Insufficient documentation

## 2023-02-22 DIAGNOSIS — R519 Headache, unspecified: Secondary | ICD-10-CM | POA: Insufficient documentation

## 2023-02-22 DIAGNOSIS — H547 Unspecified visual loss: Secondary | ICD-10-CM | POA: Insufficient documentation

## 2023-02-24 ENCOUNTER — Telehealth: Payer: Self-pay | Admitting: *Deleted

## 2023-02-24 ENCOUNTER — Ambulatory Visit
Admission: RE | Admit: 2023-02-24 | Discharge: 2023-02-24 | Disposition: A | Discharge status: Home / Self Care | Payer: Medicare HMO | Source: Ambulatory Visit | Attending: Neurology | Admitting: Neurology

## 2023-02-24 DIAGNOSIS — H4710 Unspecified papilledema: Secondary | ICD-10-CM

## 2023-02-24 DIAGNOSIS — G08 Intracranial and intraspinal phlebitis and thrombophlebitis: Secondary | ICD-10-CM

## 2023-02-24 DIAGNOSIS — R519 Headache, unspecified: Secondary | ICD-10-CM | POA: Diagnosis not present

## 2023-02-24 DIAGNOSIS — H547 Unspecified visual loss: Secondary | ICD-10-CM | POA: Diagnosis not present

## 2023-02-24 MED ORDER — IOPAMIDOL (ISOVUE-370) INJECTION 76%
500.0000 mL | Freq: Once | INTRAVENOUS | Status: AC | PRN
  Administered 2023-02-24: 75 mL via INTRAVENOUS

## 2023-02-24 NOTE — Telephone Encounter (Signed)
Mrs. Luetta Nutting Isis Zisk name change) is returning Rodney Booze Valentine's call for test results on Tangula Hetman ----[phone]

## 2023-02-24 NOTE — Telephone Encounter (Signed)
She is deathly afraid of MRV. We can sedate her. See the result note I sent about this. Could also see if Dr. Corliss Skains would perform a Cerebral venogram if she does not want to be sedated for MRI or go into the MRI again for an MRV. She has intellectual disabilities and is so scared... Please ask if she scheduled the LP as well?

## 2023-02-24 NOTE — Telephone Encounter (Signed)
Received a call from [facility] who is calling stat report on CT-V. Impression is below:   "IMPRESSION: 1. Apparent nonocclusive filling defects in the lateral aspects of both transverse sinuses are favored to reflect artifact or prominent arachnoid granulations; however, nonocclusive thrombus can not be excluded. Recommend MRV with and without contrast for further evaluation. 2. Unchanged partially empty sella.   These results will be called to the ordering clinician or representative by the Radiologist Assistant, and communication documented in the PACS or [employer].     Electronically Signed   By: Lesia Hausen M.D.   On: 02/24/2023 15:13"

## 2023-02-24 NOTE — Telephone Encounter (Signed)
I called Joni Reining in the spine center at [facility] and LVM with pt's information asking for LP asap per Dr Lucia Gaskins. Pt has CT-V today.

## 2023-02-25 ENCOUNTER — Other Ambulatory Visit: Payer: Self-pay | Admitting: Neurology

## 2023-02-25 DIAGNOSIS — G441 Vascular headache, not elsewhere classified: Secondary | ICD-10-CM

## 2023-02-25 DIAGNOSIS — G08 Intracranial and intraspinal phlebitis and thrombophlebitis: Secondary | ICD-10-CM

## 2023-02-25 DIAGNOSIS — G932 Benign intracranial hypertension: Secondary | ICD-10-CM

## 2023-02-25 NOTE — Telephone Encounter (Signed)
I called mother of pt.  I relayed the CTV results and the recommendation to clarify findings by doing a MRV w/wo sedation depending on pt preference, or doing a CTV by Dr. Corliss Skains.  Mother had not heard from GI about LP,  I relayed they did call 02/19/2023 and Prosperity LM.  She said she did have her name and # and will call her to set up after I hung up.  She will lt [place] know when is scheduled.   I relayed that both tests are with needles (MRV and CTV are with contrast (needles).  She was ok to go ahead and do the MRV with sedation.  I will let Dr. Lucia Gaskins know to order.

## 2023-02-25 NOTE — Addendum Note (Signed)
Addended by: Hermenia Fiscal S on: 02/25/2023 11:10 AM   Modules accepted: Orders

## 2023-02-25 NOTE — Telephone Encounter (Signed)
I pleaced MRi order, I told them at appointment that LP is critical if she is having vision loss I highly recommend they get that completed as soon as possible.

## 2023-02-26 ENCOUNTER — Other Ambulatory Visit: Payer: Self-pay | Admitting: Neurology

## 2023-02-26 DIAGNOSIS — G629 Polyneuropathy, unspecified: Secondary | ICD-10-CM

## 2023-02-26 NOTE — Telephone Encounter (Signed)
Mother of pt called GI yesterday and appt was made for 03-06-2023 for LP.

## 2023-02-26 NOTE — Telephone Encounter (Signed)
Drucie Opitz: [id number] exp. 02/26/23-4/21/25Granville Lewis: [id number] exp. 02/26/23-04/27/23 sent to [facility] for sedation [phone]

## 2023-02-26 NOTE — Telephone Encounter (Signed)
Last visit: 02/19/23  Next visit: 03/12/23  Per [place] registry, last fill:   Rx refill request sent to Dr Lucia Gaskins

## 2023-03-04 ENCOUNTER — Other Ambulatory Visit: Payer: Medicare HMO

## 2023-03-04 DIAGNOSIS — Z23 Encounter for immunization: Secondary | ICD-10-CM | POA: Diagnosis not present

## 2023-03-04 DIAGNOSIS — I10 Essential (primary) hypertension: Secondary | ICD-10-CM | POA: Diagnosis not present

## 2023-03-04 DIAGNOSIS — E1121 Type 2 diabetes mellitus with diabetic nephropathy: Secondary | ICD-10-CM | POA: Diagnosis not present

## 2023-03-05 NOTE — Discharge Instructions (Signed)

## 2023-03-06 ENCOUNTER — Ambulatory Visit
Admission: RE | Admit: 2023-03-06 | Discharge: 2023-03-06 | Disposition: A | Discharge status: Home / Self Care | Payer: Medicare HMO | Source: Ambulatory Visit | Attending: Neurology | Admitting: Neurology

## 2023-03-06 ENCOUNTER — Telehealth: Payer: Self-pay | Admitting: Neurology

## 2023-03-06 DIAGNOSIS — G932 Benign intracranial hypertension: Secondary | ICD-10-CM | POA: Diagnosis not present

## 2023-03-06 DIAGNOSIS — H4710 Unspecified papilledema: Secondary | ICD-10-CM

## 2023-03-06 NOTE — Telephone Encounter (Signed)
I spoke briefly with patient then her mother Malachi Bonds (on [place]).  Her mother states she is planning to increase pt's Topamax to 200 mg tonight.  She also said that patient is taking acetazolamide 500 mg once daily instead of 250 mg.  She verbalized understanding to the need for patient to see ophthalmology every 6 months. Her questions were answered and she verbalized appreciation. She also mentioned that [facility] called her today and scheduled the MRV for February. She states they told her they were booking out that far.  I told her I would let Dr. Lucia Gaskins know when she is back in the office and if that is an issue, we will let her know.

## 2023-03-06 NOTE — Telephone Encounter (Signed)
Chart reviewed,   Please call patient, lumbar puncture today confirmed a diagnosis of pseudotumor cerebri, encouraged her to continue Topamax up to 200 mg daily  Make sure she see her ophthalmologist at least every 6 months   42 year old seen by Dr. Lucia Gaskins on February 19, 2023,  Lumbar puncture March 06, 2023 showed opening pressure of 37 cm water,  CT venogram now occlusive filling defect in the lateral aspect of both transverse sinus, favor artifact of prominent arachnoid granulation, MRV is pending February 2025  Could not tolerate acetazolamide, can only tolerate 250 mg a day, was given Topamax 100 mg titrating to 200 mg a day

## 2023-03-12 ENCOUNTER — Encounter: Payer: Self-pay | Admitting: Neurology

## 2023-03-12 ENCOUNTER — Ambulatory Visit (INDEPENDENT_AMBULATORY_CARE_PROVIDER_SITE_OTHER): Payer: Medicare HMO | Admitting: Neurology

## 2023-03-12 VITALS — BP 125/85 | HR 99 | Ht 60.0 in | Wt 263.0 lb

## 2023-03-12 DIAGNOSIS — G932 Benign intracranial hypertension: Secondary | ICD-10-CM

## 2023-03-12 DIAGNOSIS — G08 Intracranial and intraspinal phlebitis and thrombophlebitis: Secondary | ICD-10-CM | POA: Diagnosis not present

## 2023-03-12 DIAGNOSIS — R519 Headache, unspecified: Secondary | ICD-10-CM | POA: Diagnosis not present

## 2023-03-12 DIAGNOSIS — H4710 Unspecified papilledema: Secondary | ICD-10-CM

## 2023-03-12 DIAGNOSIS — H547 Unspecified visual loss: Secondary | ICD-10-CM

## 2023-03-12 NOTE — Progress Notes (Signed)
[facility]    Provider:  Dr Lucia Gaskins Requesting Provider: Melida Quitter, MD Primary Care Provider:  Melida Quitter, MD    CC:  papilledema,   She had an LP it was OP 37. She felt much better only complained of headaches twice since the LP. Taking acetazolamide in the morning 9could not tolerate more) and now on topiramate 200mg  at bedtime and doing better.   02/22/2023: Patient with papilledema dxed with IDIOPATHIC INTRACRANIAL HYPERTENSION here for urgent referral from Dr. Dione Booze, reviewed his notes she has papilledema, prior imaging and opening pressure consistent with IDIOPATHIC INTRACRANIAL HYPERTENSION. Here owth parents who are concerned, she has vision loss, She cannot tolerate acetazolamide. The medication makes her sick. She was prescribed 500mg  twice a day but she can only tolerate 250mg  a day. She is cognitively impaired, she gets very upset at the mention of surgery, discussed risks of permanent blindness, other options such as neurosurgery, optic nerve fenestration, LPs, other medication and significant risk of blindness. Had an extended conversation about iih and preovided reading materials I ma very concerned but surgery is not an immediate options after discussions with family due to patient's wishes.   Reviewed notes, labs and imaging from outside physicians, which showed:   MRI brain w/wo: 06/05/2022:  IMPRESSION: 1. Evaluation is limited by motion artifact. Within this limitation, no acute intracranial process or acute orbital abnormality. 2. Partial empty sella, which is nonspecific but can be seen in the setting of idiopathic intracranial hypertension. Evaluation for additional findings related to intracranial hypertension is limited by motion artifact. Correlate with symptoms and consider lumbar puncture with opening pressure if clinically indicated. Patient complains of symptoms per HPI as well as the following symptoms: vision loss, cognitive  impaitrment . Pertinent negatives and positives per HPI. All others negative  06/06/2022: LP CLINICAL DATA:  42 year female with papilledema, intracranial hypertension suspected. Request for image-guided lumbar puncture.   EXAM: LUMBAR PUNCTURE UNDER FLUOROSCOPY   PROCEDURE: An appropriate skin entry site was determined fluoroscopically. Operator donned sterile gloves and mask. Skin site was marked, then prepped with Betadine, draped in usual sterile fashion, and infiltrated locally with 1% lidocaine. A 20 gauge spinal needle advanced into the thecal sac at L4-L5 from a left interlaminar approach.   Clear colorless CSF spontaneously returned, with opening pressure of 24.5 cm water. 15 ml CSF were collected and divided among 4 sterile vials for the requested laboratory studies.   Closing pressure 15.5 cm of water.   The needle was then removed. The patient tolerated the procedure well without signs of immediate postprocedure complication.   FLUOROSCOPY: Radiation Exposure Index (as provided by the fluoroscopic device): 41.5 mGy Kerma   IMPRESSION: Technically successful lumbar puncture under fluoroscopy.   This exam was performed by Loyce Dys PA-C, and was supervised and interpreted by Donzetta Kohut, MD.    HPI:  Regina Wood is a 42 y.o. female here as requested by Melida Quitter, MD for tingling in hands, feet and legs. Reviewed Dr. Sigurd Sos notes: failed gabapentin and lyrica, has developmental delay, diabetes, diabetic nephropathy. Had emg/ncs?  I reviewed Laurelyn Sickle notes, complaining of foot leg and hands hurting and wondering if she should be seen, has not gotten any better with medication changes, she is crying a lot, Also reviewed orthopedics notes Dr. Merlyn Lot March 2023 who presented with her mother, diagnosed with intellectual disability, complaints of bilateral hand pain and numbness and tingling, right worse than left, at that time  approximately ongoing 2  months, no injuries, she was diagnosed by Dr. Merlyn Lot with bilateral carpal tunnel syndrome and they chose to try splints from the options they discussed.  [name] states that a CTS test was negative however I do not see that information or any EMG nerve conduction study.  The patient is intellectually disabled and unable to provide lots of details, she complains of low back pain, elbow and knee pain, also has hypertension and has a psychiatrist, she is morbidly obese.  Patient states she has pain in the heel and whole foot like they tingle and are asleep, numbness. Mostly at night in the feet. She can go to sleep but they hurt in the morning before getting out of bed, she complains during the day as well, a few months. She was told to supplement b12 so likely B12 deficiency and diabetes. I don't have a b12 value but they will see Dr. Victorino Dike this week and can also ask about thyroid.   Hands: in the thumb and 1st 3 fingers . Saw Dr. Merlyn Lot, diagnosed with CTS, and the splints didn't fit.  First 3 fingers. We discussed Dr. Merrilee Seashore evaluation and that she was diagnosed with CTS. We reviewed CTS. Mother had CTS and release. She has not been wearing the wrist splints at night. Discussed emg/ncs. Also offered OT but mother would like to make sure it is CTS will send back to Dr. Merlyn Lot for emg/ncs which he recommended anyway.  Reviewed notes, labs and imaging from outside physicians, which showed:  Labs collected include B12, BMP with elevated glucose otherwise normal with BUN 70 creatinine 0.7, A1c 6.9, I do not see B12 value in the notes.  Review of Systems: Patient complains of symptoms per HPI as well as the following symptoms pain. Pertinent negatives and positives per HPI. All others negative.   Social History   Socioeconomic History   Marital status: Single    Spouse name: Not on file   Number of children: Not on file   Years of education: Not on file   Highest education level: Not on file   Occupational History   Not on file  Tobacco Use   Smoking status: Never   Smokeless tobacco: Never  Vaping Use   Vaping status: Never Used  Substance and Sexual Activity   Alcohol use: Yes    Comment: beer occ   Drug use: Never   Sexual activity: Not on file  Other Topics Concern   Not on file  Social History Narrative   Caffeine: in hot chocolate   Left handed   Lives at home with parents   Social Determinants of Health   Financial Resource Strain: Not on file  Food Insecurity: Not on file  Transportation Needs: Not on file  Physical Activity: Not on file  Stress: Not on file  Social Connections: Not on file  Intimate Partner Violence: Not on file    Family History  Problem Relation Age of Onset   Healthy Mother    Migraines Mother    Hypertension Father    Diabetes Father    Neuropathy Neg Hx    Pseudotumor cerebri Neg Hx     Past Medical History:  Diagnosis Date   CTS (carpal tunnel syndrome)    Diabetes mellitus without complication (HCC)    Idiopathic intracranial hypertension    Obesity    Pneumonia     Patient Active Problem List   Diagnosis Date Noted   Asthma 03/13/2023  IIH (idiopathic intracranial hypertension) 02/22/2023   Papilledema 02/22/2023   Severe headache 02/22/2023   Vision loss 02/22/2023   Diabetic renal disease (HCC) 01/29/2023   Skin sensation disturbance 01/29/2023   Suspected sleep apnea 04/19/2022   Chronic respiratory failure (HCC) 03/07/2022   Abnormal CT of the chest 12/31/2019   Intellectual disability 07/07/2019   Morbid (severe) obesity due to excess calories (HCC) 07/07/2019   Hyperglycemia 04/21/2019   HTN (hypertension) 04/21/2019   Developmental delay 04/21/2019    Past Surgical History:  Procedure Laterality Date   NO PAST SURGERIES      Current Outpatient Medications  Medication Sig Dispense Refill   acetaminophen (TYLENOL) 500 MG tablet Take 1,000 mg by mouth every 6 (six) hours as needed for  moderate pain.     acetaZOLAMIDE (DIAMOX) 250 MG tablet Take 2 tablets (500 mg total) by mouth 2 (two) times daily. 120 tablet 0   benztropine (COGENTIN) 1 MG tablet Take 1 mg by mouth at bedtime.     blood glucose meter kit and supplies Dispense based on patient and insurance preference. Use up to four times daily as directed. (FOR ICD-10 E10.9, E11.9). 1 each 0   dapagliflozin propanediol (FARXIGA) 5 MG TABS tablet Take 5 mg by mouth daily.     famotidine (PEPCID) 20 MG tablet One after supper 30 tablet 11   losartan (COZAAR) 100 MG tablet Take 100 mg by mouth daily.     metFORMIN (GLUCOPHAGE-XR) 750 MG 24 hr tablet Take 750 mg by mouth daily with breakfast.     nebivolol (BYSTOLIC) 2.5 MG tablet Take 2.5 mg by mouth daily.     ondansetron (ZOFRAN-ODT) 8 MG disintegrating tablet Take 1 tablet (8 mg total) by mouth every 8 (eight) hours as needed for nausea or vomiting. 10 tablet 0   pantoprazole (PROTONIX) 40 MG tablet Take 1 tablet (40 mg total) by mouth daily. Take 30-60 min before first meal of the day 30 tablet 2   pregabalin (LYRICA) 200 MG capsule TAKE 1 CAPSULE BY MOUTH TWICE A DAY 60 capsule 5   Spacer/Aero-Holding Chambers (AEROCHAMBER MV) inhaler Use as instructed 1 each 0   topiramate (TOPAMAX) 100 MG tablet Start with 1 pill at bedtime. Then in 2 weeks if no side effects can increase to 2 pills at bedtime. 60 tablet 6   albuterol (VENTOLIN HFA) 108 (90 Base) MCG/ACT inhaler Inhale 2 puffs into the lungs every 6 (six) hours as needed for wheezing or shortness of breath. 8 g 2   No current facility-administered medications for this visit.    Allergies as of 03/12/2023   (No Known Allergies)    Vitals: BP 125/85   Pulse 99   Ht 5' (1.524 m)   Wt 263 lb (119.3 kg)   LMP 10/28/2019 (Approximate)   BMI 51.36 kg/m  Last Weight:  Wt Readings from Last 1 Encounters:  03/13/23 265 lb 3.2 oz (120.3 kg)   Last Height:   Ht Readings from Last 1 Encounters:  03/13/23 5' (1.524 m)   Physical exam: Exam: Gen: NAD, conversant, well nourised, obese, well groomed                     CV: RRR, no MRG. No Carotid Bruits. No peripheral edema, warm, nontender Eyes: Conjunctivae clear without exudates or hemorrhage  Neuro: Detailed Neurologic Exam  Speech:    Speech is normal Cognition:    Cognitiveky impaired Cranial Nerves:    The pupils are  equal, round, and reactive to light.  Could not visualize as well today due to patient movement although it appears that the papilledema is improved I think that the outer margins of the disc were much clearer we will await ophthalmology exam. Visual fields are full to finger confrontation. Extraocular movements are intact. Trigeminal sensation is intact and the muscles of mastication are normal. The face is symmetric. The palate elevates in the midline. Hearing intact. Voice is normal. Shoulder shrug is normal. The tongue has normal motion without fasciculations.   Coordination: No dysmetria or ataxia noted on observation Gait: Wide-based Motor Observation:    No asymmetry, no atrophy, and no involuntary movements noted. Tone:    Normal muscle tone.    Posture:    Posture is normal. normal erect    Strength:    Strength is V/V in the upper and lower limbs.      Sensation: intact to LT     Reflex Exam:  DTR's:    Deep tendon reflexes in the upper and lower symmetrical are normal bilaterally.   Toes:    The toes are equivocal bilaterally.   Clonus:    Clonus is absent.  Assessment/Plan:  Papilledema and IDIOPATHIC INTRACRANIAL HYPERTENSION. Patient with papilledema dxed with IDIOPATHIC INTRACRANIAL HYPERTENSION here for urgent referral from Dr. Dione Booze, reviewed his notes she has papilledema, prior imaging and opening pressure consistent with IDIOPATHIC INTRACRANIAL HYPERTENSION. Here owth parents who are concerned, she has vision loss, She cannot tolerate acetazolamide. The medication makes her sick. She was prescribed  500mg  twice a day but she can only tolerate 250mg  a day. She is cognitively impaired, she gets very upset at the mention of surgery, discussed risks of permanent blindness, other options such as neurosurgery, optic nerve fenestration, LPs, other medication and significant risk of blindness. Had an extended conversation about iih and preovided reading materials I ma very concerned but surgery is not an immediate options after discussions with family due to patient's wishes.  IF she gets worse she will call, we will order another LP for fluid removal and call Dr. Dione Booze for re-examination asap, please inform Dr. Lucia Gaskins asap should she call.   - LP with opening pressure 33, removed 37ml of fluid, patient felt much better, tolerated procedure very well and in fact had a "great time" per parents (who are here and provide much information) with Dr. Alfredo Batty (much appreciate my colleague) - She did not tolerate Diamox BID but appears to be doing better on Diamox in the morning and Topiramate 200mg  at bedtime. I had limited view of her fundi but appear papilledema is improved, she will see my wonderful colleague Dr. Dione Booze in early December and will await his examination for verification - we had a conversation about IDIOPATHIC INTRACRANIAL HYPERTENSION again today, I think the family felt better about everything, said Ms. Hankes is not complaining of headaches often anymore, patient is lovely, I answered all question, we will see them back later in December after exam with ophthalmology - Possible Transverse sinus thrombosis, saw filling defect CTV, follow up MRV   Scheduled for sedated MRV 06/2022: Need a full physical exam at follow up appointment and to forward to team see email, for the sedation.   CTV showed: IMPRESSION: 1. Apparent nonocclusive filling defects in the lateral aspects of both transverse sinuses are favored to reflect artifact or prominent arachnoid granulations; however, nonocclusive thrombus can  not be excluded. Recommend MRV with and without contrast for further evaluation. 2. Unchanged partially  empty sell  Referred to the [facility]   Weight loss is critical, discussed weight loss and risk of permanent vision loss   For any acute change especially worsening headache or vision loss call 911 and proceed to ED  To prevent or relieve headaches, try the following: Cool Compress. Lie down and place a cool compress on your head.  Avoid headache triggers. If certain foods or odors seem to have triggered your migraines in the past, avoid them. A headache diary might help you identify triggers.  Include physical activity in your daily routine. Try a daily walk or other moderate aerobic exercise.  Manage stress. Find healthy ways to cope with the stressors, such as delegating tasks on your to-do list.  Practice relaxation techniques. Try deep breathing, yoga, massage and visualization.  Eat regularly. Eating regularly scheduled meals and maintaining a healthy diet might help prevent headaches. Also, drink plenty of fluids.  Follow a regular sleep schedule. Sleep deprivation might contribute to headaches Consider biofeedback. With this mind-body technique, you learn to control certain bodily functions -- such as muscle tension, heart rate and blood pressure -- to prevent headaches or reduce headache pain.    Proceed to emergency room if you experience new or worsening symptoms or symptoms do not resolve, if you have new neurologic symptoms or if headache is severe, or for any concerning symptom.   Provided education and documentation from American headache Society toolbox including articles on: pseudotumoer cerebri(IIH), chronic migraine medication overuse headache, chronic migraines, prevention of migraines and other headaches, behavioral and other nonpharmacologic treatments for headache.   PRIOR A/P 11/2021:  Hands:  +Mcphalen's and +Tinels at the wrists. Has Seen  Ortho dxed with CTS: Per Dr. Merlyn Lot copied from his notes "Plan: I discussed with Sheppard Coil and her mother the nature of carpal tunnel syndrome. We discussed treatment options including acceptance, day and/or nighttime splinting, injection of the carpal tunnel, and carpal tunnel release. We discussed potential further evaluation with nerve conduction studies. At this time she and her mother would like to try wrist splints and see if this provides some relief. I think this is reasonable. She was provided splints for use as needed. I will see her back on a as needed basis. If they note any worsening of symptoms or failure to improve I will be happy to see her back. They agree with the plan of care."   - My recommendation for the hands would be to return to Dr. Merlyn Lot and get EMG/NCS and pursue treatment. I will send referral back to his office for emg/ncs and further treatment.  Feet: Patient states she has pain in the heel and whole foot like they tingle and are asleep, numbness. Mostly at night in the feet. She can go to sleep but they hurt in the morning before getting out of bed and all day. She was told to supplement b12 so maybe B12 deficiency?(I can't find the B12 value in the consult notes). and diabetes. I don't have a b12 value but they will see Dr. Victorino Dike this week and can also ask about thyroid as well and make sure that has been completed at some point. I will test for a few more causes of neuropathy (by exam appears to be small-fiber). Increased Lyrica. Dr. Victorino Dike can further increase Lyrica as needed.  -May consider daily alpha lipoic acid which is an antioxidant that may reduce free radical oxidative stress associated with diabetic polyneuropathy, existing evidence suggests that alpha  lipoic acid significantly reduces stabbing, lancinating and burning pain and diabetic neuropathy with its onset of action as early as 1-2 weeks. 400-600mg /day  - topical lidocaine or capsaicin on the feet  Cc:  Melida Quitter, MD,  Nadene Rubins Nyoka Cowden, MD  Naomie Dean, MD  [facility] [address] [place], [place] [address]  Phone [phone] Fax [phone] I spent over 45 minutes of face-to-face and non-face-to-face time with patient on the  1. IIH (idiopathic intracranial hypertension)   2. Papilledema   3. Severe headache   4. Vision loss   5. Morbid obesity (HCC)   6. Possible Transverse sinus thrombosis, saw filling defect CTV, follow up MRV    diagnosis.  This included previsit chart review, lab review, study review, order entry, electronic health record documentation, patient education on the different diagnostic and therapeutic options, counseling and coordination of care, risks and benefits of management, compliance, or risk factor reduction

## 2023-03-13 ENCOUNTER — Encounter: Payer: Self-pay | Admitting: Emergency Medicine

## 2023-03-13 ENCOUNTER — Ambulatory Visit: Payer: Medicare HMO | Admitting: Emergency Medicine

## 2023-03-13 VITALS — BP 108/84 | HR 94 | Temp 98.4°F | Ht 60.0 in | Wt 265.2 lb

## 2023-03-13 DIAGNOSIS — J4520 Mild intermittent asthma, uncomplicated: Secondary | ICD-10-CM

## 2023-03-13 DIAGNOSIS — R9389 Abnormal findings on diagnostic imaging of other specified body structures: Secondary | ICD-10-CM | POA: Diagnosis not present

## 2023-03-13 DIAGNOSIS — J069 Acute upper respiratory infection, unspecified: Secondary | ICD-10-CM | POA: Diagnosis not present

## 2023-03-13 DIAGNOSIS — J45909 Unspecified asthma, uncomplicated: Secondary | ICD-10-CM | POA: Insufficient documentation

## 2023-03-13 DIAGNOSIS — R6250 Unspecified lack of expected normal physiological development in childhood: Secondary | ICD-10-CM | POA: Diagnosis not present

## 2023-03-13 DIAGNOSIS — J9601 Acute respiratory failure with hypoxia: Secondary | ICD-10-CM | POA: Diagnosis not present

## 2023-03-13 MED ORDER — ALBUTEROL SULFATE HFA 108 (90 BASE) MCG/ACT IN AERS: 2.0000 | INHALATION_SPRAY | Freq: Four times a day (QID) | RESPIRATORY_TRACT | 2 refills | Status: DC | PRN

## 2023-03-13 NOTE — Assessment & Plan Note (Signed)
Suspect mild asthma based on a history of intermittent dyspnea although stable currently.  Also evidence for possible air trapping on CT chest that has not changed over time.  She did not benefit from [facility] but I will ensure that she has albuterol to use if needed.  She does have a spacer.

## 2023-03-13 NOTE — Assessment & Plan Note (Signed)
Scattered bilateral groundglass infiltrates versus air trapping.  They are unchanged on serial imaging and she is asymptomatic.  Inconsistent with an active pneumonitis.  I suspect this is air trapping.  We have not been able to do formal PFT but she may have some obstructive lung disease.  I will hold off on a dedicated repeat CT unless there is a clinical change.

## 2023-03-13 NOTE — Patient Instructions (Signed)
We reviewed your CT of the chest today. We will send a prescription for albuterol to your pharmacy.  You can use 2 puffs if you needed for shortness of breath, chest tightness, wheezing. Congratulations on your weight loss.  Continue your work on this. Follow Dr. Delton Coombes in 1 year.  Please call sooner if you have any problems or changes in your breathing.

## 2023-03-13 NOTE — Progress Notes (Signed)
   Subjective:    Patient ID: Regina Wood, female    DOB: [birth year], 42 y.o.   MRN: [id number]  HPI  ROV 03/13/2023 --42 year old woman with history of obesity, diabetes, developmental delay and hypertension, suspected asthma.  She has also been noted to have areas of patchy bilateral groundglass consistent with either pneumonitis or air trapping.  She saw Dr. Sherene Sires in September with URI symptoms but no evidence for bronchospasm.  Repeat CT scan of the chest was done 12/05/2022. She has stable exertional SOB. Gets SOB with about 50-100 ft. They are working on wt loss and having some success. She is on nocturnal O2.   CT scan of the chest 12/05/2022 reviewed by me, shows no lymphadenopathy, no significant change in mosaic attenuation, most consistent with air trapping    Vitals:   03/13/23 1335  BP: 108/84  Pulse: 94  Temp: 98.4 F (36.9 C)  TempSrc: Oral  SpO2: 93%  Weight: 265 lb 3.2 oz (120.3 kg)  Height: 5' (1.524 m)  Gen: Pleasant, obese woman, in no distress   ENT: No lesions,  mouth clear,  oropharynx clear, no postnasal drip   Neck: No JVD, no stridor   Lungs: No use of accessory muscles, very decreased at both bases, distant, no crackles or wheezing on normal respiration   Cardiovascular: RRR, heart sounds normal, no murmur or gallops, no peripheral edema   Musculoskeletal: No deformities, no cyanosis or clubbing   Neuro: alert, awake, developmental delay and depends on her parents to answer most questions.    Skin: Warm, no lesions or rash  Abnormal CT of the chest Scattered bilateral groundglass infiltrates versus air trapping.  They are unchanged on serial imaging and she is asymptomatic.  Inconsistent with an active pneumonitis.  I suspect this is air trapping.  We have not been able to do formal PFT but she may have some obstructive lung disease.  I will hold off on a dedicated repeat CT unless there is a clinical change.  Asthma Suspect mild asthma based on a  history of intermittent dyspnea although stable currently.  Also evidence for possible air trapping on CT chest that has not changed over time.  She did not benefit from [facility] but I will ensure that she has albuterol to use if needed.  She does have a spacer.    Levy Pupa, MD, PhD 03/13/2023, 1:58 PM [facility] [phone] or if no answer [phone]

## 2023-03-15 ENCOUNTER — Encounter: Payer: Self-pay | Admitting: Neurology

## 2023-03-15 NOTE — Patient Instructions (Signed)
Idiopathic Intracranial Hypertension  Idiopathic intracranial hypertension (IIH) is a condition that increases pressure around the brain. The fluid that surrounds the brain and spinal cord (cerebrospinal fluid, or CSF) increases and causes the pressure. Idiopathic means that the cause of this condition is not known. IIH affects the brain and spinal cord. If this condition is not treated, it can cause vision loss or blindness. What are the causes? The cause of this condition is not known. What increases the risk? The following factors may make you more likely to develop this condition: Being obese. Being a person who is female, between the ages of 76 and 62 years old, and who has not gone through menopause. Taking certain medicines, such as birth control, acne medicines, or steroids. What are the signs or symptoms? Symptoms of this condition include: Headaches. This is the most common symptom. Brief periods of total blindness. Double vision, blurred vision, or poor side (peripheral) vision. Pain in the shoulders or neck. Nausea and vomiting. A sound like rushing water or a pulsing sound within the ears (pulsatile tinnitus), or ringing in the ears. How is this diagnosed? This condition may be diagnosed based on: Your symptoms and medical history. Imaging tests of the brain, such as: CT scan. MRI. Magnetic resonance venogram (MRV) to check the veins. Diagnostic lumbar puncture. This is a procedure to remove and examine a sample of CSF. This procedure can determine whether your fluid pressure is too high. An eye exam to check for swelling or nerve damage in the eyes. How is this treated? Treatment for this condition depends on the symptoms. The goal of treatment is to decrease the pressure around your brain. Common treatments include: Weight loss through healthy eating, salt restriction, and exercise, if you are overweight. Medicines to decrease the production of CSF and lower the pressure  within your skull. Medicines to prevent or treat headaches. Other treatments may include: Surgery to place drains (shunts) in your brain to remove extra fluid. Lumbar puncture to remove extra CSF. Follow these instructions at home: If you are overweight or obese, work with your health care provider to lose weight. Take over-the-counter and prescription medicines only as told by your health care provider. Ask your health care provider if the medicine prescribed to you requires you to avoid driving or using machinery. Do not use any products that contain nicotine or tobacco. These products include cigarettes, chewing tobacco, and vaping devices, such as e-cigarettes. If you need help quitting, ask your health care provider. Keep all follow-up visits. Your health care provider will need to monitor you regularly. Contact a health care provider if: You have changes in your vision, such as: Double vision. Blurred vision. Poor peripheral vision. Get help right away if: You have any of the following symptoms and they get worse or do not get better: Headaches. Nausea. Vomiting. Sudden trouble seeing. This information is not intended to replace advice given to you by your health care provider. Make sure you discuss any questions you have with your health care provider. Document Revised: 09/18/2021 Document Reviewed: 08/28/2021 Elsevier Patient Education  2024 [employer].

## 2023-03-15 NOTE — Progress Notes (Incomplete)
[facility]    Provider:  Dr Lucia Gaskins Requesting Provider: Melida Quitter, MD Primary Care Provider:  Melida Quitter, MD    CC:  papilledema,   She had an LP it was OP 38. She felt much better only complained of headaches twice since the LP. Taking acetazolamide in the morning 9could not tolerate more) and now on topiramate 200mg  at bedtime and doing better.   02/22/2023: Patient with papilledema dxed with IDIOPATHIC INTRACRANIAL HYPERTENSION here for urgent referral from Dr. Dione Booze, reviewed his notes she has papilledema, prior imaging and opening pressure consistent with IDIOPATHIC INTRACRANIAL HYPERTENSION. Here owth parents who are concerned, she has vision loss, She cannot tolerate acetazolamide. The medication makes her sick. She was prescribed 500mg  twice a day but she can only tolerate 250mg  a day. She is cognitively impaired, she gets very upset at the mention of surgery, discussed risks of permanent blindness, other options such as neurosurgery, optic nerve fenestration, LPs, other medication and significant risk of blindness. Had an extended conversation about iih and preovided reading materials I ma very concerned but surgery is not an immediate options after discussions with family due to patient's wishes.   Reviewed notes, labs and imaging from outside physicians, which showed:   MRI brain w/wo: 06/05/2022:  IMPRESSION: 1. Evaluation is limited by motion artifact. Within this limitation, no acute intracranial process or acute orbital abnormality. 2. Partial empty sella, which is nonspecific but can be seen in the setting of idiopathic intracranial hypertension. Evaluation for additional findings related to intracranial hypertension is limited by motion artifact. Correlate with symptoms and consider lumbar puncture with opening pressure if clinically indicated. Patient complains of symptoms per HPI as well as the following symptoms: vision loss, cognitive  impaitrment . Pertinent negatives and positives per HPI. All others negative  06/06/2022: LP CLINICAL DATA:  42 year female with papilledema, intracranial hypertension suspected. Request for image-guided lumbar puncture.   EXAM: LUMBAR PUNCTURE UNDER FLUOROSCOPY   PROCEDURE: An appropriate skin entry site was determined fluoroscopically. Operator donned sterile gloves and mask. Skin site was marked, then prepped with Betadine, draped in usual sterile fashion, and infiltrated locally with 1% lidocaine. A 20 gauge spinal needle advanced into the thecal sac at L4-L5 from a left interlaminar approach.   Clear colorless CSF spontaneously returned, with opening pressure of 24.5 cm water. 15 ml CSF were collected and divided among 4 sterile vials for the requested laboratory studies.   Closing pressure 15.5 cm of water.   The needle was then removed. The patient tolerated the procedure well without signs of immediate postprocedure complication.   FLUOROSCOPY: Radiation Exposure Index (as provided by the fluoroscopic device): 41.5 mGy Kerma   IMPRESSION: Technically successful lumbar puncture under fluoroscopy.   This exam was performed by Loyce Dys PA-C, and was supervised and interpreted by Donzetta Kohut, MD.    HPI:  Regina Wood is a 42 y.o. female here as requested by Melida Quitter, MD for tingling in hands, feet and legs. Reviewed Dr. Sigurd Sos notes: failed gabapentin and lyrica, has developmental delay, diabetes, diabetic nephropathy. Had emg/ncs?  I reviewed Laurelyn Sickle notes, complaining of foot leg and hands hurting and wondering if she should be seen, has not gotten any better with medication changes, she is crying a lot, Also reviewed orthopedics notes Dr. Merlyn Lot March 2023 who presented with her mother, diagnosed with intellectual disability, complaints of bilateral hand pain and numbness and tingling, right worse than left, at that time  approximately ongoing 2  months, no injuries, she was diagnosed by Dr. Merlyn Lot with bilateral carpal tunnel syndrome and they chose to try splints from the options they discussed.  [name] states that a CTS test was negative however I do not see that information or any EMG nerve conduction study.  The patient is intellectually disabled and unable to provide lots of details, she complains of low back pain, elbow and knee pain, also has hypertension and has a psychiatrist, she is morbidly obese.  Patient states she has pain in the heel and whole foot like they tingle and are asleep, numbness. Mostly at night in the feet. She can go to sleep but they hurt in the morning before getting out of bed, she complains during the day as well, a few months. She was told to supplement b12 so likely B12 deficiency and diabetes. I don't have a b12 value but they will see Dr. Victorino Dike this week and can also ask about thyroid.   Hands: in the thumb and 1st 3 fingers . Saw Dr. Merlyn Lot, diagnosed with CTS, and the splints didn't fit.  First 3 fingers. We discussed Dr. Merrilee Seashore evaluation and that she was diagnosed with CTS. We reviewed CTS. Mother had CTS and release. She has not been wearing the wrist splints at night. Discussed emg/ncs. Also offered OT but mother would like to make sure it is CTS will send back to Dr. Merlyn Lot for emg/ncs which he recommended anyway.  Reviewed notes, labs and imaging from outside physicians, which showed:  Labs collected include B12, BMP with elevated glucose otherwise normal with BUN 70 creatinine 0.7, A1c 6.9, I do not see B12 value in the notes.  Review of Systems: Patient complains of symptoms per HPI as well as the following symptoms pain. Pertinent negatives and positives per HPI. All others negative.   Social History   Socioeconomic History  . Marital status: Single    Spouse name: Not on file  . Number of children: Not on file  . Years of education: Not on file  . Highest education level: Not on file   Occupational History  . Not on file  Tobacco Use  . Smoking status: Never  . Smokeless tobacco: Never  Vaping Use  . Vaping status: Never Used  Substance and Sexual Activity  . Alcohol use: Yes    Comment: beer occ  . Drug use: Never  . Sexual activity: Not on file  Other Topics Concern  . Not on file  Social History Narrative   Caffeine: in hot chocolate   Left handed   Lives at home with parents   Social Determinants of Health   Financial Resource Strain: Not on file  Food Insecurity: Not on file  Transportation Needs: Not on file  Physical Activity: Not on file  Stress: Not on file  Social Connections: Not on file  Intimate Partner Violence: Not on file    Family History  Problem Relation Age of Onset  . Healthy Mother   . Migraines Mother   . Hypertension Father   . Diabetes Father   . Neuropathy Neg Hx   . Pseudotumor cerebri Neg Hx     Past Medical History:  Diagnosis Date  . Diabetes mellitus without complication (HCC)   . Obesity   . Pneumonia     Patient Active Problem List   Diagnosis Date Noted  . IIH (idiopathic intracranial hypertension) 02/22/2023  . Papilledema 02/22/2023  . Severe headache 02/22/2023  . Vision  loss 02/22/2023  . Diabetic renal disease (HCC) 01/29/2023  . Skin sensation disturbance 01/29/2023  . Suspected sleep apnea 04/19/2022  . Upper respiratory infection, acute 03/08/2022  . Chronic respiratory failure (HCC) 03/07/2022  . Abnormal CT of the chest 12/31/2019  . Intellectual disability 07/07/2019  . Morbid (severe) obesity due to excess calories (HCC) 07/07/2019  . Acute respiratory failure with hypoxia (HCC) 04/21/2019  . Pneumonia 04/21/2019  . Hyperglycemia 04/21/2019  . HTN (hypertension) 04/21/2019  . Developmental delay 04/21/2019    Past Surgical History:  Procedure Laterality Date  . NO PAST SURGERIES      Current Outpatient Medications  Medication Sig Dispense Refill  . acetaminophen (TYLENOL) 500  MG tablet Take 1,000 mg by mouth every 6 (six) hours as needed for moderate pain.    Marland Kitchen acetaZOLAMIDE (DIAMOX) 250 MG tablet Take 2 tablets (500 mg total) by mouth 2 (two) times daily. 120 tablet 0  . benztropine (COGENTIN) 1 MG tablet Take 1 mg by mouth at bedtime.    . blood glucose meter kit and supplies Dispense based on patient and insurance preference. Use up to four times daily as directed. (FOR ICD-10 E10.9, E11.9). 1 each 0  . dapagliflozin propanediol (FARXIGA) 5 MG TABS tablet Take 5 mg by mouth daily.    . famotidine (PEPCID) 20 MG tablet One after supper 30 tablet 11  . losartan (COZAAR) 100 MG tablet Take 100 mg by mouth daily.    . metFORMIN (GLUCOPHAGE-XR) 750 MG 24 hr tablet Take 750 mg by mouth daily with breakfast.    . mometasone-formoterol (DULERA) 100-5 MCG/ACT AERO Inhale 2 puffs into the lungs 2 (two) times daily. 1 each 4  . nebivolol (BYSTOLIC) 2.5 MG tablet Take 2.5 mg by mouth daily.    . ondansetron (ZOFRAN-ODT) 8 MG disintegrating tablet Take 1 tablet (8 mg total) by mouth every 8 (eight) hours as needed for nausea or vomiting. 10 tablet 0  . pantoprazole (PROTONIX) 40 MG tablet Take 1 tablet (40 mg total) by mouth daily. Take 30-60 min before first meal of the day 30 tablet 2  . pregabalin (LYRICA) 200 MG capsule TAKE 1 CAPSULE BY MOUTH TWICE A DAY 60 capsule 5  . Spacer/Aero-Holding Chambers (AEROCHAMBER MV) inhaler Use as instructed 1 each 0  . topiramate (TOPAMAX) 100 MG tablet Start with 1 pill at bedtime. Then in 2 weeks if no side effects can increase to 2 pills at bedtime. 60 tablet 6   No current facility-administered medications for this visit.    Allergies as of 03/12/2023  . (No Known Allergies)    Vitals: LMP 10/28/2019 (Approximate)  Last Weight:  Wt Readings from Last 1 Encounters:  02/19/23 264 lb (119.7 kg)   Last Height:   Ht Readings from Last 1 Encounters:  02/19/23 4\' 9"  (1.448 m)   Physical exam: Exam: Gen: NAD, conversant, well  nourised, morbidly obese, well groomed                     CV: RRR, no MRG. No Carotid Bruits. No peripheral edema, warm, nontender Eyes: Conjunctivae clear without exudates or hemorrhage  Neuro: Detailed Neurologic Exam  Speech:    Speech is normal; fluent .  Cognition:   Cognitive impairment Cranial Nerves:    The pupils are equal, round, and reactive to light. Papilledema. Visual fields are impaired to finger confrontation. Extraocular movements are intact. Trigeminal sensation is intact and the muscles of mastication are normal. The face  is symmetric. The palate elevates in the midline. Hearing intact. Voice is normal. Shoulder shrug is normal. The tongue has normal motion without fasciculations.   Coordination: No attaxia noted  Gait: No ataxia, wide based  Motor Observation:    No asymmetry, no atrophy, and no involuntary movements noted. Tone:    Normal muscle tone.    Posture:    Posture is normal. normal erect    Strength:    Strength is equal and antigravity in the upper and lower limbs.      Sensation: intact to LT     Reflex Exam:  DTR's:    Deep tendon reflexes in the upper and lower extremities are symmetrical bilaterally.   Toes:    The toes are downgoing bilaterally.   Clonus:    Clonus is absent.   Assessment/Plan:  Papilledema and IDIOPATHIC INTRACRANIAL HYPERTENSION. Patient with papilledema dxed with IDIOPATHIC INTRACRANIAL HYPERTENSION here for urgent referral from Dr. Dione Booze, reviewed his notes she has papilledema, prior imaging and opening pressure consistent with IDIOPATHIC INTRACRANIAL HYPERTENSION. Here owth parents who are concerned, she has vision loss, She cannot tolerate acetazolamide. The medication makes her sick. She was prescribed 500mg  twice a day but she can only tolerate 250mg  a day. She is cognitively impaired, she gets very upset at the mention of surgery, discussed risks of permanent blindness, other options such as neurosurgery, optic  nerve fenestration, LPs, other medication and significant risk of blindness. Had an extended conversation about iih and preovided reading materials I ma very concerned but surgery is not an immediate options after discussions with family due to patient's wishes.  Scheduled for sedated MRV 06/2022: Need a full physical exam at follow up appointment and to forward to team see email, for the sedation.   CTV showed: IMPRESSION: 1. Apparent nonocclusive filling defects in the lateral aspects of both transverse sinuses are favored to reflect artifact or prominent arachnoid granulations; however, nonocclusive thrombus can not be excluded. Recommend MRV with and without contrast for further evaluation. 2. Unchanged partially empty sell   Start Topiramate. Start with one pill and if no side effects increase to 2 pills at bedtime.  CT Scan of the Veins Lumbar Puncture Refer to the [facility]   IF she gets worse she will call, we will order another LP for fluid removal and call Dr. Dione Booze for re-examination asap.  No orders of the defined types were placed in this encounter.  No orders of the defined types were placed in this encounter.  Lumbar puncture for opening pressure and labs.   Weight loss is critical, discussed weight loss and risk of permanent vision loss   For any acute change especially worsening headache or vision loss call 911 and proceed to ED  To prevent or relieve headaches, try the following: Cool Compress. Lie down and place a cool compress on your head.  Avoid headache triggers. If certain foods or odors seem to have triggered your migraines in the past, avoid them. A headache diary might help you identify triggers.  Include physical activity in your daily routine. Try a daily walk or other moderate aerobic exercise.  Manage stress. Find healthy ways to cope with the stressors, such as delegating tasks on your to-do list.  Practice relaxation techniques.  Try deep breathing, yoga, massage and visualization.  Eat regularly. Eating regularly scheduled meals and maintaining a healthy diet might help prevent headaches. Also, drink plenty of fluids.  Follow a regular sleep schedule. Sleep  deprivation might contribute to headaches Consider biofeedback. With this mind-body technique, you learn to control certain bodily functions -- such as muscle tension, heart rate and blood pressure -- to prevent headaches or reduce headache pain.    Proceed to emergency room if you experience new or worsening symptoms or symptoms do not resolve, if you have new neurologic symptoms or if headache is severe, or for any concerning symptom.   Provided education and documentation from American headache Society toolbox including articles on: pseudotumoer cerebri(IIH), chronic migraine medication overuse headache, chronic migraines, prevention of migraines and other headaches, behavioral and other nonpharmacologic treatments for headache.   PRIOR A/P 11/2021:  Hands:  +Mcphalen's and +Tinels at the wrists. Has Seen Ortho dxed with CTS: Per Dr. Merlyn Lot copied from his notes "Plan: I discussed with Sheppard Coil and her mother the nature of carpal tunnel syndrome. We discussed treatment options including acceptance, day and/or nighttime splinting, injection of the carpal tunnel, and carpal tunnel release. We discussed potential further evaluation with nerve conduction studies. At this time she and her mother would like to try wrist splints and see if this provides some relief. I think this is reasonable. She was provided splints for use as needed. I will see her back on a as needed basis. If they note any worsening of symptoms or failure to improve I will be happy to see her back. They agree with the plan of care."   - My recommendation for the hands would be to return to Dr. Merlyn Lot and get EMG/NCS and pursue treatment. I will send referral back to his office for emg/ncs and further  treatment.  Feet: Patient states she has pain in the heel and whole foot like they tingle and are asleep, numbness. Mostly at night in the feet. She can go to sleep but they hurt in the morning before getting out of bed and all day. She was told to supplement b12 so maybe B12 deficiency?(I can't find the B12 value in the consult notes). and diabetes. I don't have a b12 value but they will see Dr. Victorino Dike this week and can also ask about thyroid as well and make sure that has been completed at some point. I will test for a few more causes of neuropathy (by exam appears to be small-fiber). Increased Lyrica. Dr. Victorino Dike can further increase Lyrica as needed.  -May consider daily alpha lipoic acid which is an antioxidant that may reduce free radical oxidative stress associated with diabetic polyneuropathy, existing evidence suggests that alpha lipoic acid significantly reduces stabbing, lancinating and burning pain and diabetic neuropathy with its onset of action as early as 1-2 weeks. 400-600mg /day  - topical lidocaine or capsaicin on the feet  No orders of the defined types were placed in this encounter.  No orders of the defined types were placed in this encounter.   Cc: Melida Quitter, MD,  Melida Quitter, MD  Naomie Dean, MD  [facility] [address] [place], [place] [address]  Phone [phone] Fax [phone]  I spent over 60 minutes of face-to-face and non-face-to-face time with patient on the  No diagnosis found.   diagnosis.  This included previsit chart review, lab review, study review, order entry, electronic health record documentation, patient education on the different diagnostic and therapeutic options, counseling and coordination of care, risks and benefits of management, compliance, or risk factor reduction

## 2023-03-20 ENCOUNTER — Ambulatory Visit (INDEPENDENT_AMBULATORY_CARE_PROVIDER_SITE_OTHER): Payer: Medicare HMO | Admitting: Physician Assistant

## 2023-03-20 ENCOUNTER — Encounter (INDEPENDENT_AMBULATORY_CARE_PROVIDER_SITE_OTHER): Payer: Self-pay | Admitting: Physician Assistant

## 2023-03-20 VITALS — BP 116/79 | HR 70 | Temp 97.8°F | Ht 59.5 in | Wt 260.0 lb

## 2023-03-20 DIAGNOSIS — F79 Unspecified intellectual disabilities: Secondary | ICD-10-CM | POA: Diagnosis not present

## 2023-03-20 DIAGNOSIS — R739 Hyperglycemia, unspecified: Secondary | ICD-10-CM

## 2023-03-20 DIAGNOSIS — Z6841 Body Mass Index (BMI) 40.0 and over, adult: Secondary | ICD-10-CM

## 2023-03-20 DIAGNOSIS — Z0289 Encounter for other administrative examinations: Secondary | ICD-10-CM

## 2023-03-20 DIAGNOSIS — G932 Benign intracranial hypertension: Secondary | ICD-10-CM | POA: Diagnosis not present

## 2023-03-20 DIAGNOSIS — E1165 Type 2 diabetes mellitus with hyperglycemia: Secondary | ICD-10-CM | POA: Insufficient documentation

## 2023-03-20 DIAGNOSIS — H4710 Unspecified papilledema: Secondary | ICD-10-CM

## 2023-03-20 DIAGNOSIS — E66813 Obesity, class 3: Secondary | ICD-10-CM | POA: Diagnosis not present

## 2023-03-20 DIAGNOSIS — J4520 Mild intermittent asthma, uncomplicated: Secondary | ICD-10-CM

## 2023-03-20 DIAGNOSIS — E1121 Type 2 diabetes mellitus with diabetic nephropathy: Secondary | ICD-10-CM | POA: Diagnosis not present

## 2023-03-20 DIAGNOSIS — R7989 Other specified abnormal findings of blood chemistry: Secondary | ICD-10-CM | POA: Diagnosis not present

## 2023-03-20 DIAGNOSIS — I10 Essential (primary) hypertension: Secondary | ICD-10-CM | POA: Diagnosis not present

## 2023-03-20 NOTE — Progress Notes (Signed)
Office: [phone]  /  Fax: [phone]   Initial Visit  Regina Wood was seen in clinic today to evaluate for obesity. She was here with her mom, Italie Lejeune who assists with her history and provides support and information for the visit today. The patient is intellectually disabled and unable to provide lots of details, she complains of low back pain, elbow and knee pain, also has hypertension and has a psychiatrist, she is morbidly obese.   She is interested in losing weight to improve overall health and reduce the risk of weight related complications. She presents today to review program treatment options, initial physical assessment, and evaluation.     She was referred by: Specialist- Dr. Lucia Gaskins- Neurology  When asked what else they would like to accomplish? She states: Adopt healthier eating patterns, Improve energy levels and physical activity, Improve existing medical conditions, Reduce number of medications, Improve quality of life, Improve appearance, Improve self-confidence, and Lose a target amount of weight : 100 lbs in 12 months.  Weight history: Gained weight since identical sister passed away in Apr 07, 2017.   When asked how has your weight affected you? She states: Contributed to medical problems, Contributed to orthopedic problems or mobility issues, Having fatigue, Having poor endurance, and Problems with eating patterns  Some associated conditions: Diabetes  Contributing factors: Family history of obesity, Consumption of processed foods, Use of obesogenic medications: Beta-blockers and Other: Lyrica for pain management, Reduced physical activity, Eating patterns, and Slow metabolism for age  Weight promoting medications identified: Beta-blockers and Other: Lyrica for pan   Current nutrition plan: None  Current level of physical activity: None and Limited due to chronic pain or orthopedic problems  Current or previous pharmacotherapy: GLP-1 + GIP  Response to  medication:  Just started on Mounjaro for Type 2 diabetes management   Past medical history includes:   Past Medical History:  Diagnosis Date   CTS (carpal tunnel syndrome)    Diabetes mellitus without complication (HCC)    Idiopathic intracranial hypertension    Obesity    Pneumonia      Objective:   BP 116/79   Pulse 70   Temp 97.8 F (36.6 C)   Ht 4' 11.5" (1.511 m)   Wt 260 lb (117.9 kg)   LMP 10/28/2019 (Approximate)   SpO2 100%   BMI 51.63 kg/m  She was weighed on the bioimpedance scale: Body mass index is 51.63 kg/m.  Peak Weight:273 lbs , Body Fat%:55.6%, Visceral Fat Rating:20, Weight trend over the last 12 months: Increasing  General:  Alert, oriented and cooperative. Patient is in no acute distress.  Respiratory: Normal respiratory effort, no problems with respiration noted   Gait: able to ambulate independently  Mental Status: Normal mood and affect. Normal behavior. Normal judgment and thought content.   DIAGNOSTIC DATA REVIEWED:  BMET    Component Value Date/Time   NA 139 02/07/2023 1224   K 4.1 02/07/2023 1224   CL 97 (L) 02/07/2023 1224   CO2 32 02/07/2023 1209   GLUCOSE 205 (H) 02/07/2023 1224   BUN 8 02/07/2023 1224   CREATININE 0.70 02/07/2023 1224   CALCIUM 9.1 02/07/2023 1209   GFRNONAA >60 02/07/2023 1209   GFRAA >60 04/21/2019 0610   Lab Results  Component Value Date   HGBA1C 7.6 (H) 03/09/2022   HGBA1C 8.7 (H) 04/21/2019   No results found for: "INSULIN" CBC    Component Value Date/Time   WBC 11.1 (H) 02/07/2023 1209   RBC 5.80 (  H) 02/07/2023 1209   HGB 16.7 (H) 02/07/2023 1224   HCT 49.0 (H) 02/07/2023 1224   PLT 226 02/07/2023 1209   MCV 82.4 02/07/2023 1209   MCH 24.3 (L) 02/07/2023 1209   MCHC 29.5 (L) 02/07/2023 1209   RDW 16.6 (H) 02/07/2023 1209   Iron/TIBC/Ferritin/ %Sat    Component Value Date/Time   FERRITIN 25 04/20/2019 2340   Lipid Panel     Component Value Date/Time   TRIG 104 04/20/2019 2341    Hepatic Function Panel     Component Value Date/Time   PROT 7.6 02/07/2023 1209   PROT 7.1 11/26/2021 1604   ALBUMIN 3.4 (L) 02/07/2023 1209   AST 21 02/07/2023 1209   ALT 18 02/07/2023 1209   ALKPHOS 91 02/07/2023 1209   BILITOT 0.7 02/07/2023 1209   No results found for: "TSH"   Assessment and Plan:   Hyperglycemia  Intellectual disability  IIH (idiopathic intracranial hypertension)  Class 3 severe obesity due to excess calories with serious comorbidity and body mass index (BMI) of 50.0 to 59.9 in adult [facility])        Obesity Treatment / Action Plan:  Patient will work on garnering support from family and friends to begin weight loss journey. Will work on eliminating or reducing the presence of highly palatable, calorie dense foods in the home. Will complete provided nutritional and psychosocial assessment questionnaire before the next appointment. Will be scheduled for indirect calorimetry to determine resting energy expenditure in a fasting state.  This will allow [place] to create a reduced calorie, high-protein meal plan to promote loss of fat mass while preserving muscle mass. Will think about ideas on how to incorporate physical activity into their daily routine. Will work on reducing intake of added sugars, simple sugars and processed carbs. Will reduce liquid calories and sugary drinks from diet. Counseled on the health benefits of losing 5%-15% of total body weight. Was counseled on nutritional approaches to weight loss and benefits of reducing processed foods and consuming plant-based foods and high quality protein as part of nutritional weight management. Was counseled on pharmacotherapy and role as an adjunct in weight management.  Will work on increasing water intake with a goal of 125 ounces for men and 91 ounces for women.   Obesity Education Performed Today:  She was weighed on the bioimpedance scale and results were discussed and documented in the  synopsis.  We discussed obesity as a disease and the importance of a more detailed evaluation of all the factors contributing to the disease.  We discussed the importance of long term lifestyle changes which include nutrition, exercise and behavioral modifications as well as the importance of customizing this to her specific health and social needs.  We discussed the benefits of reaching a healthier weight to alleviate the symptoms of existing conditions and reduce the risks of the biomechanical, metabolic and psychological effects of obesity.  ELAINE RATTERREE appears to be in the action stage of change and states they are ready to start intensive lifestyle modifications and behavioral modifications.  30 minutes was spent today on this visit including the above counseling, pre-visit chart review, and post-visit documentation.  Reviewed by clinician on day of visit: allergies, medications, problem list, medical history, surgical history, family history, social history, and previous encounter notes pertinent to obesity diagnosis.   Adaley Kiene,PA-C

## 2023-03-24 DIAGNOSIS — J9601 Acute respiratory failure with hypoxia: Secondary | ICD-10-CM | POA: Diagnosis not present

## 2023-03-27 DIAGNOSIS — E1121 Type 2 diabetes mellitus with diabetic nephropathy: Secondary | ICD-10-CM | POA: Diagnosis not present

## 2023-03-27 DIAGNOSIS — Z1339 Encounter for screening examination for other mental health and behavioral disorders: Secondary | ICD-10-CM | POA: Diagnosis not present

## 2023-03-27 DIAGNOSIS — Z0000 Encounter for general adult medical examination without abnormal findings: Secondary | ICD-10-CM | POA: Diagnosis not present

## 2023-03-27 DIAGNOSIS — Z6841 Body Mass Index (BMI) 40.0 and over, adult: Secondary | ICD-10-CM | POA: Diagnosis not present

## 2023-03-27 DIAGNOSIS — Z1331 Encounter for screening for depression: Secondary | ICD-10-CM | POA: Diagnosis not present

## 2023-03-27 DIAGNOSIS — R809 Proteinuria, unspecified: Secondary | ICD-10-CM | POA: Diagnosis not present

## 2023-03-27 DIAGNOSIS — H4710 Unspecified papilledema: Secondary | ICD-10-CM | POA: Diagnosis not present

## 2023-03-27 DIAGNOSIS — R82998 Other abnormal findings in urine: Secondary | ICD-10-CM | POA: Diagnosis not present

## 2023-03-27 DIAGNOSIS — I10 Essential (primary) hypertension: Secondary | ICD-10-CM | POA: Diagnosis not present

## 2023-03-27 DIAGNOSIS — M4186 Other forms of scoliosis, lumbar region: Secondary | ICD-10-CM | POA: Diagnosis not present

## 2023-03-27 DIAGNOSIS — F79 Unspecified intellectual disabilities: Secondary | ICD-10-CM | POA: Diagnosis not present

## 2023-03-27 DIAGNOSIS — J45909 Unspecified asthma, uncomplicated: Secondary | ICD-10-CM | POA: Diagnosis not present

## 2023-04-09 ENCOUNTER — Other Ambulatory Visit: Payer: Self-pay | Admitting: Internal Medicine

## 2023-04-09 DIAGNOSIS — Z0000 Encounter for general adult medical examination without abnormal findings: Secondary | ICD-10-CM

## 2023-04-12 DIAGNOSIS — J9601 Acute respiratory failure with hypoxia: Secondary | ICD-10-CM | POA: Diagnosis not present

## 2023-04-12 DIAGNOSIS — J069 Acute upper respiratory infection, unspecified: Secondary | ICD-10-CM | POA: Diagnosis not present

## 2023-04-12 DIAGNOSIS — R6250 Unspecified lack of expected normal physiological development in childhood: Secondary | ICD-10-CM | POA: Diagnosis not present

## 2023-04-16 DIAGNOSIS — H40033 Anatomical narrow angle, bilateral: Secondary | ICD-10-CM | POA: Diagnosis not present

## 2023-04-16 DIAGNOSIS — H04123 Dry eye syndrome of bilateral lacrimal glands: Secondary | ICD-10-CM | POA: Diagnosis not present

## 2023-04-16 DIAGNOSIS — H4710 Unspecified papilledema: Secondary | ICD-10-CM | POA: Diagnosis not present

## 2023-04-16 DIAGNOSIS — E119 Type 2 diabetes mellitus without complications: Secondary | ICD-10-CM | POA: Diagnosis not present

## 2023-04-16 LAB — HM DIABETES EYE EXAM

## 2023-04-22 ENCOUNTER — Ambulatory Visit (INDEPENDENT_AMBULATORY_CARE_PROVIDER_SITE_OTHER): Payer: Medicare HMO | Admitting: Internal Medicine

## 2023-04-23 DIAGNOSIS — J9601 Acute respiratory failure with hypoxia: Secondary | ICD-10-CM | POA: Diagnosis not present

## 2023-04-29 ENCOUNTER — Encounter: Payer: Self-pay | Admitting: Neurology

## 2023-04-29 ENCOUNTER — Ambulatory Visit (INDEPENDENT_AMBULATORY_CARE_PROVIDER_SITE_OTHER): Payer: Medicare HMO | Admitting: Neurology

## 2023-04-29 VITALS — BP 115/77 | HR 90 | Ht 59.5 in | Wt 261.0 lb

## 2023-04-29 DIAGNOSIS — R6250 Unspecified lack of expected normal physiological development in childhood: Secondary | ICD-10-CM

## 2023-04-29 DIAGNOSIS — E538 Deficiency of other specified B group vitamins: Secondary | ICD-10-CM

## 2023-04-29 DIAGNOSIS — R5383 Other fatigue: Secondary | ICD-10-CM

## 2023-04-29 DIAGNOSIS — F79 Unspecified intellectual disabilities: Secondary | ICD-10-CM | POA: Diagnosis not present

## 2023-04-29 DIAGNOSIS — G932 Benign intracranial hypertension: Secondary | ICD-10-CM

## 2023-04-29 DIAGNOSIS — Z79899 Other long term (current) drug therapy: Secondary | ICD-10-CM

## 2023-04-29 MED ORDER — TOPIRAMATE 100 MG PO TABS: 250.0000 mg | ORAL_TABLET | Freq: Every day | ORAL | 3 refills | Status: DC

## 2023-04-29 NOTE — Progress Notes (Signed)
[facility]    Provider:  Dr Lucia Gaskins Requesting Provider: Melida Quitter, MD Primary Care Provider:  Melida Quitter, MD    CC:  papilledema,   05/04/2023: She did not take her medicine, last week. Dr. Dione Booze, ophthalmologist. Her symptoms worsened when she did not take her medication. She saw Dr. Dione Booze. Parents here and report most information. They saw she has difficulty with speech, talks all the time, interrupts and they would like therapy. No vision changes.   Patient complains of symptoms per HPI as well as the following symptoms: obesity . Pertinent negatives and positives per HPI. All others negative  03/12/2023: She had an LP it was OP 37. She felt much better only complained of headaches twice since the LP. Taking acetazolamide in the morning 9could not tolerate more) and now on topiramate 200mg  at bedtime and doing better. She missed her meds and the headaches worsend also the setting of couugh and cold. Whn taking medications feels better. She is on her meds and still getting headaches. Prior to the illness was doing well. She tried to take a sleep test but didn't slepe. Up at night up and down.   02/22/2023: Patient with papilledema dxed with IDIOPATHIC INTRACRANIAL HYPERTENSION here for urgent referral from Dr. Dione Booze, reviewed his notes she has papilledema, prior imaging and opening pressure consistent with IDIOPATHIC INTRACRANIAL HYPERTENSION. Here owth parents who are concerned, she has vision loss, She cannot tolerate acetazolamide. The medication makes her sick. She was prescribed 500mg  twice a day but she can only tolerate 250mg  a day. She is cognitively impaired, she gets very upset at the mention of surgery, discussed risks of permanent blindness, other options such as neurosurgery, optic nerve fenestration, LPs, other medication and significant risk of blindness. Had an extended conversation about iih and preovided reading materials I ma very concerned but  surgery is not an immediate options after discussions with family due to patient's wishes.   Reviewed notes, labs and imaging from outside physicians, which showed:   MRI brain w/wo: 06/05/2022:  IMPRESSION: 1. Evaluation is limited by motion artifact. Within this limitation, no acute intracranial process or acute orbital abnormality. 2. Partial empty sella, which is nonspecific but can be seen in the setting of idiopathic intracranial hypertension. Evaluation for additional findings related to intracranial hypertension is limited by motion artifact. Correlate with symptoms and consider lumbar puncture with opening pressure if clinically indicated. Patient complains of symptoms per HPI as well as the following symptoms: vision loss, cognitive impaitrment . Pertinent negatives and positives per HPI. All others negative  06/06/2022: LP CLINICAL DATA:  42 year female with papilledema, intracranial hypertension suspected. Request for image-guided lumbar puncture.   EXAM: LUMBAR PUNCTURE UNDER FLUOROSCOPY   PROCEDURE: An appropriate skin entry site was determined fluoroscopically. Operator donned sterile gloves and mask. Skin site was marked, then prepped with Betadine, draped in usual sterile fashion, and infiltrated locally with 1% lidocaine. A 20 gauge spinal needle advanced into the thecal sac at L4-L5 from a left interlaminar approach.   Clear colorless CSF spontaneously returned, with opening pressure of 24.5 cm water. 15 ml CSF were collected and divided among 4 sterile vials for the requested laboratory studies.   Closing pressure 15.5 cm of water.   The needle was then removed. The patient tolerated the procedure well without signs of immediate postprocedure complication.   FLUOROSCOPY: Radiation Exposure Index (as provided by the fluoroscopic device): 41.5 mGy Kerma   IMPRESSION: Technically successful lumbar puncture  under fluoroscopy.   This exam was performed by  Loyce Dys PA-C, and was supervised and interpreted by Donzetta Kohut, MD.    HPI:  DYLYNN MORONEY is a 42 y.o. female here as requested by Melida Quitter, MD for tingling in hands, feet and legs. Reviewed Dr. Sigurd Sos notes: failed gabapentin and lyrica, has developmental delay, diabetes, diabetic nephropathy. Had emg/ncs?  I reviewed Laurelyn Sickle notes, complaining of foot leg and hands hurting and wondering if she should be seen, has not gotten any better with medication changes, she is crying a lot, Also reviewed orthopedics notes Dr. Merlyn Lot March 2023 who presented with her mother, diagnosed with intellectual disability, complaints of bilateral hand pain and numbness and tingling, right worse than left, at that time approximately ongoing 2 months, no injuries, she was diagnosed by Dr. Merlyn Lot with bilateral carpal tunnel syndrome and they chose to try splints from the options they discussed.  [name] states that a CTS test was negative however I do not see that information or any EMG nerve conduction study.  The patient is intellectually disabled and unable to provide lots of details, she complains of low back pain, elbow and knee pain, also has hypertension and has a psychiatrist, she is morbidly obese.  Patient states she has pain in the heel and whole foot like they tingle and are asleep, numbness. Mostly at night in the feet. She can go to sleep but they hurt in the morning before getting out of bed, she complains during the day as well, a few months. She was told to supplement b12 so likely B12 deficiency and diabetes. I don't have a b12 value but they will see Dr. Victorino Dike this week and can also ask about thyroid.   Hands: in the thumb and 1st 3 fingers . Saw Dr. Merlyn Lot, diagnosed with CTS, and the splints didn't fit.  First 3 fingers. We discussed Dr. Merrilee Seashore evaluation and that she was diagnosed with CTS. We reviewed CTS. Mother had CTS and release. She has not been wearing the wrist splints at  night. Discussed emg/ncs. Also offered OT but mother would like to make sure it is CTS will send back to Dr. Merlyn Lot for emg/ncs which he recommended anyway.  Reviewed notes, labs and imaging from outside physicians, which showed:  Labs collected include B12, BMP with elevated glucose otherwise normal with BUN 70 creatinine 0.7, A1c 6.9, I do not see B12 value in the notes.  Review of Systems: Patient complains of symptoms per HPI as well as the following symptoms pain. Pertinent negatives and positives per HPI. All others negative.   Social History   Socioeconomic History   Marital status: Single    Spouse name: Not on file   Number of children: Not on file   Years of education: Not on file   Highest education level: Not on file  Occupational History   Not on file  Tobacco Use   Smoking status: Never   Smokeless tobacco: Never  Vaping Use   Vaping status: Never Used  Substance and Sexual Activity   Alcohol use: Yes    Comment: beer occ   Drug use: Never   Sexual activity: Not on file  Other Topics Concern   Not on file  Social History Narrative   Caffeine: in hot chocolate   Left handed   Lives at home with parents   Social Drivers of Health   Financial Resource Strain: Not on file  Food Insecurity: Not on  file  Transportation Needs: Not on file  Physical Activity: Not on file  Stress: Not on file  Social Connections: Not on file  Intimate Partner Violence: Not on file    Family History  Problem Relation Age of Onset   Healthy Mother    Migraines Mother    Hypertension Father    Diabetes Father    Neuropathy Neg Hx    Pseudotumor cerebri Neg Hx     Past Medical History:  Diagnosis Date   CTS (carpal tunnel syndrome)    Diabetes mellitus without complication (HCC)    Idiopathic intracranial hypertension    Obesity    Pneumonia     Patient Active Problem List   Diagnosis Date Noted   Class 3 severe obesity due to excess calories with serious  comorbidity and body mass index (BMI) of 50.0 to 59.9 in adult (HCC) 03/20/2023   Type 2 diabetes mellitus with hyperglycemia, without long-term current use of insulin (HCC) 03/20/2023   Asthma 03/13/2023   IIH (idiopathic intracranial hypertension) 02/22/2023   Papilledema 02/22/2023   Severe headache 02/22/2023   Vision loss 02/22/2023   Diabetic renal disease (HCC) 01/29/2023   Skin sensation disturbance 01/29/2023   Suspected sleep apnea 04/19/2022   Chronic respiratory failure (HCC) 03/07/2022   Abnormal CT of the chest 12/31/2019   Intellectual disability 07/07/2019   Morbid (severe) obesity due to excess calories (HCC) 07/07/2019   Hyperglycemia 04/21/2019   HTN (hypertension) 04/21/2019   Developmental delay 04/21/2019    Past Surgical History:  Procedure Laterality Date   NO PAST SURGERIES      Current Outpatient Medications  Medication Sig Dispense Refill   acetaminophen (TYLENOL) 500 MG tablet Take 1,000 mg by mouth every 6 (six) hours as needed for moderate pain.     acetaZOLAMIDE (DIAMOX) 250 MG tablet Take 2 tablets (500 mg total) by mouth 2 (two) times daily. 120 tablet 0   albuterol (VENTOLIN HFA) 108 (90 Base) MCG/ACT inhaler Inhale 2 puffs into the lungs every 6 (six) hours as needed for wheezing or shortness of breath. 8 g 2   benztropine (COGENTIN) 1 MG tablet Take 1 mg by mouth at bedtime.     blood glucose meter kit and supplies Dispense based on patient and insurance preference. Use up to four times daily as directed. (FOR ICD-10 E10.9, E11.9). 1 each 0   dapagliflozin propanediol (FARXIGA) 5 MG TABS tablet Take 5 mg by mouth daily.     famotidine (PEPCID) 20 MG tablet One after supper 30 tablet 11   losartan (COZAAR) 100 MG tablet Take 100 mg by mouth daily.     metFORMIN (GLUCOPHAGE-XR) 750 MG 24 hr tablet Take 750 mg by mouth daily with breakfast.     nebivolol (BYSTOLIC) 2.5 MG tablet Take 2.5 mg by mouth daily.     ondansetron (ZOFRAN-ODT) 8 MG  disintegrating tablet Take 1 tablet (8 mg total) by mouth every 8 (eight) hours as needed for nausea or vomiting. 10 tablet 0   pantoprazole (PROTONIX) 40 MG tablet TAKE 1 TABLET (40 MG TOTAL) BY MOUTH DAILY. TAKE 30-60 MIN BEFORE FIRST MEAL OF THE DAY 90 tablet 1   pregabalin (LYRICA) 200 MG capsule TAKE 1 CAPSULE BY MOUTH TWICE A DAY 60 capsule 5   Spacer/Aero-Holding Chambers (AEROCHAMBER MV) inhaler Use as instructed 1 each 0   topiramate (TOPAMAX) 100 MG tablet Take 2.5 tablets (250 mg total) by mouth at bedtime. Then can increase to 300mg  at bedtime if  needed(3 tabs) 270 tablet 3   No current facility-administered medications for this visit.    Allergies as of 04/29/2023   (No Known Allergies)    Vitals: BP 115/77 (Cuff Size: Large)   Pulse 90   Ht 4' 11.5" (1.511 m)   Wt 261 lb (118.4 kg)   LMP 10/28/2019 (Approximate)   BMI 51.83 kg/m  Last Weight:  Wt Readings from Last 1 Encounters:  04/29/23 261 lb (118.4 kg)   Last Height:   Ht Readings from Last 1 Encounters:  04/29/23 4' 11.5" (1.511 m)  Physical exam: Exam: Gen: NAD, conversant, well nourised, obese, well groomed                     CV: RRR, no MRG. No Carotid Bruits. No peripheral edema, warm, nontender Eyes: Conjunctivae clear without exudates or hemorrhage Abdomen soft and non tender Neck supple Physical Exam Constitutional:      Appearance: She is obese.  HENT:     Head: Normocephalic and atraumatic.     Right Ear: External ear normal.     Left Ear: External ear normal.     Nose: Nose normal.     Mouth/Throat:     Mouth: Mucous membranes are moist.     Pharynx: Oropharynx is clear.  Eyes:     Extraocular Movements: Extraocular movements intact.     Conjunctiva/sclera: Conjunctivae normal.     Pupils: Pupils are equal, round, and reactive to light.  Cardiovascular:     Rate and Rhythm: Normal rate and regular rhythm.     Pulses: Normal pulses.     Heart sounds: Normal heart sounds.  Pulmonary:      Effort: Pulmonary effort is normal.     Breath sounds: Normal breath sounds.  Abdominal:     General: Bowel sounds are normal.     Palpations: Abdomen is soft.  Musculoskeletal:        General: Normal range of motion.     Cervical back: Normal range of motion and neck supple.  Skin:    General: Skin is warm and dry.  Neurological:     Mental Status: She is alert.   Neuro: Detailed Neurologic Exam  Speech:    Speech is normal; fluent and spontaneous  Cognition:    The patient is oriented to person, place, and time;     Cognitive impairment chronic Cranial Nerves:    The pupils are equal, round, and reactive to light. Fundi appear flat cannot visualize due to eye movement/intellectual disability cannot follow examinstructions Visual fields are full to finger confrontation. Extraocular movements are intact. Trigeminal sensation is intact and the muscles of mastication are normal. The face is symmetric. The palate elevates in the midline. Hearing intact. Voice is normal. Shoulder shrug is normal. The tongue has normal motion without fasciculations.   Coordination: nml  Gait: Can heel and toe walk, slightly wide based and brisk walk  Motor Observation: No assymmetry or tremor Tone: nml  Posture: nml    Strength:    Strength is V/V in the upper and lower limbs.      Sensation: intact to LT     Reflex Exam:  DTR's:    Deep tendon reflexes in the upper and lower extremities are symmetrical bilaterally.   Toes:    The toes are equiv bilaterally.   Clonus:    Clonus is absent.  Assessment/Plan:  Papilledema and IDIOPATHIC INTRACRANIAL HYPERTENSION. Patient with papilledema dxed with IDIOPATHIC INTRACRANIAL HYPERTENSION  here for urgent referral from Dr. Dione Booze, reviewed his notes she has papilledema, prior imaging and opening pressure consistent with IDIOPATHIC INTRACRANIAL HYPERTENSION. Here owth parents who are concerned, she has vision loss, She cannot tolerate  acetazolamide. The medication makes her sick. She was prescribed 500mg  twice a day but she can only tolerate 250mg  a day. She is cognitively impaired, she gets very upset at the mention of surgery, discussed risks of permanent blindness, other options such as neurosurgery, optic nerve fenestration, LPs, other medication and significant risk of blindness. Had an extended conversation about iih and preovided reading materials I ma very concerned but surgery is not an immediate options after discussions with family due to patient's wishes.  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 Next door. Developmental delay/intellectual disabilitt  Orders Placed This Encounter  Procedures   Topiramate Level   Acetazolamide, (Diamox), S/P   CBC with Differential/Platelets   Comprehensive metabolic panel   TSH Rfx on Abnormal to Free T4   B12 and Folate Panel   Methylmalonic acid, serum   Ambulatory referral to Speech Therapy   Meds ordered this encounter  Medications   topiramate (TOPAMAX) 100 MG tablet    Sig: Take 2.5 tablets (250 mg total) by mouth at bedtime. Then can increase to 300mg  at bedtime if needed(3 tabs)    Dispense:  270 tablet    Refill:  3     IF she gets worse she will call, we will order another LP for fluid removal and call Dr. Dione Booze for re-examination asap, please inform Dr. Lucia Gaskins asap should she call.   - LP with opening pressure 33, removed 37ml of fluid, patient felt much better, tolerated procedure very  well and in fact had a "great time" per parents (who are here and provide much information) with Dr. Alfredo Batty (much appreciate my colleague) - She did not tolerate Diamox BID but appears to be doing better on Diamox in the morning and Topiramate 200mg  at bedtime. I had limited view of her fundi but appear papilledema is improved, she will see my wonderful colleague Dr. Dione Booze in early December and will await his examination for verification - we had a conversation about IDIOPATHIC INTRACRANIAL HYPERTENSION again today, I think the family felt better about everything, said Ms. Mckenna is not complaining of headaches often anymore, patient is lovely, I answered all question, we will see them back later in December after exam with ophthalmology - Possible Transverse sinus thrombosis, saw filling defect CTV, follow up MRV   Scheduled for sedated MRV 06/2022: Need a full physical exam at follow up appointment and to forward to team see email, for the sedation.   CTV showed: IMPRESSION: 1. Apparent nonocclusive filling defects in the lateral aspects of both transverse sinuses are favored to reflect artifact or prominent arachnoid granulations; however, nonocclusive thrombus can not be excluded. Recommend MRV with and without contrast for further evaluation. 2. Unchanged partially empty sell  Referred to the [facility]   Weight loss is critical, discussed weight loss and risk of permanent  vision loss   For any acute change especially worsening headache or vision loss call 911 and proceed to ED  To prevent or relieve headaches, try the following: Cool Compress. Lie down and place a cool compress on your head.  Avoid headache triggers. If certain foods or odors seem to have triggered your migraines in the past, avoid them. A headache diary might help you identify triggers.  Include physical activity in your daily routine. Try a daily walk or other moderate aerobic exercise.  Manage  stress. Find healthy ways to cope with the stressors, such as delegating tasks on your to-do list.  Practice relaxation techniques. Try deep breathing, yoga, massage and visualization.  Eat regularly. Eating regularly scheduled meals and maintaining a healthy diet might help prevent headaches. Also, drink plenty of fluids.  Follow a regular sleep schedule. Sleep deprivation might contribute to headaches Consider biofeedback. With this mind-body technique, you learn to control certain bodily functions -- such as muscle tension, heart rate and blood pressure -- to prevent headaches or reduce headache pain.    Proceed to emergency room if you experience new or worsening symptoms or symptoms do not resolve, if you have new neurologic symptoms or if headache is severe, or for any concerning symptom.   Provided education and documentation from American headache Society toolbox including articles on: pseudotumoer cerebri(IIH), chronic migraine medication overuse headache, chronic migraines, prevention of migraines and other headaches, behavioral and other nonpharmacologic treatments for headache.   PRIOR A/P 11/2021:  Hands:  +Mcphalen's and +Tinels at the wrists. Has Seen Ortho dxed with CTS: Per Dr. Merlyn Lot copied from his notes "Plan: I discussed with Sheppard Coil and her mother the nature of carpal tunnel syndrome. We discussed treatment options including acceptance, day and/or nighttime splinting, injection of the carpal tunnel, and carpal tunnel release. We discussed potential further evaluation with nerve conduction studies. At this time she and her mother would like to try wrist splints and see if this provides some relief. I think this is reasonable. She was provided splints for use as needed. I will see her back on a as needed basis. If they note any worsening of symptoms or failure to improve I will be happy to see her back. They agree with the plan of care."   - My recommendation for the hands  would be to return to Dr. Merlyn Lot and get EMG/NCS and pursue treatment. I will send referral back to his office for emg/ncs and further treatment.  Feet: Patient states she has pain in the heel and whole foot like they tingle and are asleep, numbness. Mostly at night in the feet. She can go to sleep but they hurt in the morning before getting out of bed and all day. She was told to supplement b12 so maybe B12 deficiency?(I can't find the B12 value in the consult notes). and diabetes. I don't have a b12 value but they will see Dr. Victorino Dike this week and can also ask about thyroid as well and make sure that has been completed at some point. I will test for a few more causes of neuropathy (by exam appears to be small-fiber). Increased Lyrica. Dr. Victorino Dike can further increase Lyrica as needed.  -May consider daily alpha lipoic acid which is an antioxidant that may reduce free radical oxidative stress associated with diabetic polyneuropathy, existing evidence suggests that alpha lipoic acid significantly reduces stabbing, lancinating and burning pain and diabetic neuropathy with its onset of action as early as 1-2 weeks. 400-600mg /day  -  topical lidocaine or capsaicin on the feet  Cc: Melida Quitter, MD,  Nadene Rubins Nyoka Cowden, MD  Naomie Dean, MD  [facility] [address] [place], [place] [address]  Phone [phone] Fax [phone] I spent over 40 minutes of face-to-face and non-face-to-face time with patient on the  1. Idiopathic intracranial hypertension   2. B12 deficiency   3. Long term use of drug   4. Other fatigue   5. Developmental delay   6. Intellectual disability     diagnosis.  This included previsit chart review, lab review, study review, order entry, electronic health record documentation, patient education on the different diagnostic and therapeutic options, counseling and coordination of care, risks and benefits of management, compliance, or risk factor  reduction

## 2023-04-29 NOTE — Patient Instructions (Addendum)
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 Next door.   Meds ordered this encounter  Medications   topiramate (TOPAMAX) 100 MG tablet    Sig: Take 2.5 tablets (250 mg total) by mouth at bedtime. Then can increase to 300mg  at bedtime if needed(3 tabs)    Dispense:  270 tablet    Refill:  3   Orders Placed This Encounter  Procedures   Topiramate Level   Acetazolamide, (Diamox), S/P   CBC with Differential/Platelets   Comprehensive metabolic panel   TSH Rfx on Abnormal to Free T4   B12 and Folate Panel   Methylmalonic acid, serum     Idiopathic Intracranial Hypertension  Idiopathic intracranial hypertension (IIH) is a condition that increases pressure around the brain. The fluid that surrounds the brain and spinal cord (cerebrospinal fluid, or CSF) increases and causes the pressure. Idiopathic means that the cause of this condition is not known. IIH affects the brain and spinal cord. If this condition is not treated, it can cause vision loss or blindness. What are the causes? The cause of this condition is not known. What increases the risk? The following factors may make you more likely to develop this condition: Being obese. Being a person who is female, between the ages of 24 and 36 years old, and who has not gone through menopause. Taking certain medicines, such as birth control, acne medicines, or steroids. What are the signs or symptoms? Symptoms of this condition include: Headaches. This is the most common symptom. Brief periods of total blindness. Double vision, blurred vision, or poor side (peripheral) vision. Pain in the shoulders or neck. Nausea and vomiting. A  sound like rushing water or a pulsing sound within the ears (pulsatile tinnitus), or ringing in the ears. How is this diagnosed? This condition may be diagnosed based on: Your symptoms and medical history. Imaging tests of the brain, such as: CT scan. MRI. Magnetic resonance venogram (MRV) to check the veins. Diagnostic lumbar puncture. This is a procedure to remove and examine a sample of CSF. This procedure can determine whether your fluid pressure is too high. An eye exam to check for swelling or nerve damage in the eyes. How is this treated? Treatment for this condition depends on the symptoms. The goal of treatment is to decrease the pressure around your brain. Common treatments include: Weight loss through healthy eating, salt restriction, and exercise, if you are overweight. Medicines to decrease the production of CSF and lower the pressure within your skull. Medicines to prevent or treat headaches. Other treatments may include: Surgery to place drains (shunts) in your brain to remove extra fluid. Lumbar puncture to remove extra CSF. Follow these instructions at home: If you are overweight or obese, work with your health care provider to lose weight. Take over-the-counter and prescription medicines only as told by your health care provider. Ask your health care provider if the medicine prescribed to you requires you to avoid driving or using machinery. Do not use any products that contain nicotine or tobacco. These products include cigarettes, chewing tobacco, and vaping devices, such as e-cigarettes. If you need help quitting, ask your health care provider. Keep all follow-up visits. Your health care provider will need to monitor you regularly. Contact  a health care provider if: You have changes in your vision, such as: Double vision. Blurred vision. Poor peripheral vision. Get help right away if: You have any of the following symptoms and they get worse or do not get  better: Headaches. Nausea. Vomiting. Sudden trouble seeing. This information is not intended to replace advice given to you by your health care provider. Make sure you discuss any questions you have with your health care provider. Document Revised: 09/18/2021 Document Reviewed: 08/28/2021 Elsevier Patient Education  2024 [employer].

## 2023-05-02 ENCOUNTER — Other Ambulatory Visit: Payer: Self-pay | Admitting: Internal Medicine

## 2023-05-02 NOTE — Telephone Encounter (Signed)
Pt was last seen 03-13-2023. The lov does not state if you want to keep her on this medication. Please advise.

## 2023-05-05 ENCOUNTER — Telehealth: Payer: Self-pay

## 2023-05-05 ENCOUNTER — Telehealth: Payer: Self-pay | Admitting: *Deleted

## 2023-05-05 ENCOUNTER — Other Ambulatory Visit: Payer: Self-pay | Admitting: Anesthesiology

## 2023-05-05 ENCOUNTER — Telehealth: Payer: Self-pay | Admitting: Neurology

## 2023-05-05 DIAGNOSIS — R519 Headache, unspecified: Secondary | ICD-10-CM

## 2023-05-05 DIAGNOSIS — G932 Benign intracranial hypertension: Secondary | ICD-10-CM

## 2023-05-05 NOTE — Telephone Encounter (Signed)
Thank you, reviewed notes,12/03/2022: Subtle edema OU.  04/16/2023: Showed a little swelling, but improved.

## 2023-05-05 NOTE — Telephone Encounter (Signed)
Referral for speech pathology sent through EPIC to [facility]. Phone: [phone]

## 2023-05-05 NOTE — Telephone Encounter (Signed)
Called and spoke to tracy at [facility] who stated that she will fax those records asap to the pod 4 fax machine.

## 2023-05-05 NOTE — Telephone Encounter (Signed)
-----   Message from Anson Fret sent at 05/04/2023  8:10 PM EST ----- Regarding: Can I get Dr. Laruth Bouchard last notes please for patient? Can I get Dr. Laruth Bouchard last notes please for patient?

## 2023-05-06 ENCOUNTER — Ambulatory Visit (INDEPENDENT_AMBULATORY_CARE_PROVIDER_SITE_OTHER): Payer: Medicare HMO | Admitting: Internal Medicine

## 2023-05-06 NOTE — Telephone Encounter (Signed)
.    This was opened in error.

## 2023-05-13 ENCOUNTER — Ambulatory Visit: Payer: Medicare HMO

## 2023-05-13 DIAGNOSIS — J069 Acute upper respiratory infection, unspecified: Secondary | ICD-10-CM | POA: Diagnosis not present

## 2023-05-13 DIAGNOSIS — R6250 Unspecified lack of expected normal physiological development in childhood: Secondary | ICD-10-CM | POA: Diagnosis not present

## 2023-05-13 DIAGNOSIS — J9601 Acute respiratory failure with hypoxia: Secondary | ICD-10-CM | POA: Diagnosis not present

## 2023-05-15 LAB — CBC WITH DIFFERENTIAL/PLATELET
Basophils Absolute: 0.1 10*3/uL (ref 0.0–0.2)
Basos: 1 %
EOS (ABSOLUTE): 0 10*3/uL (ref 0.0–0.4)
Eos: 0 %
Hematocrit: 49.9 % — ABNORMAL HIGH (ref 34.0–46.6)
Hemoglobin: 15 g/dL (ref 11.1–15.9)
Immature Grans (Abs): 0 10*3/uL (ref 0.0–0.1)
Immature Granulocytes: 0 %
Lymphocytes Absolute: 3.4 10*3/uL — ABNORMAL HIGH (ref 0.7–3.1)
Lymphs: 28 %
MCH: 23.1 pg — ABNORMAL LOW (ref 26.6–33.0)
MCHC: 30.1 g/dL — ABNORMAL LOW (ref 31.5–35.7)
MCV: 77 fL — ABNORMAL LOW (ref 79–97)
Monocytes Absolute: 0.6 10*3/uL (ref 0.1–0.9)
Monocytes: 5 %
Neutrophils Absolute: 8 10*3/uL — ABNORMAL HIGH (ref 1.4–7.0)
Neutrophils: 66 %
Platelets: 323 10*3/uL (ref 150–450)
RBC: 6.48 x10E6/uL — ABNORMAL HIGH (ref 3.77–5.28)
RDW: 19 % — ABNORMAL HIGH (ref 11.7–15.4)
WBC: 12.2 10*3/uL — ABNORMAL HIGH (ref 3.4–10.8)

## 2023-05-15 LAB — COMPREHENSIVE METABOLIC PANEL
ALT: 9 [IU]/L (ref 0–32)
AST: 11 [IU]/L (ref 0–40)
Albumin: 4.2 g/dL (ref 3.9–4.9)
Alkaline Phosphatase: 130 [IU]/L — ABNORMAL HIGH (ref 44–121)
BUN/Creatinine Ratio: 13 (ref 9–23)
BUN: 11 mg/dL (ref 6–24)
Bilirubin Total: 0.3 mg/dL (ref 0.0–1.2)
CO2: 26 mmol/L (ref 20–29)
Calcium: 9.9 mg/dL (ref 8.7–10.2)
Chloride: 101 mmol/L (ref 96–106)
Creatinine, Ser: 0.82 mg/dL (ref 0.57–1.00)
Globulin, Total: 3.6 g/dL (ref 1.5–4.5)
Glucose: 117 mg/dL — ABNORMAL HIGH (ref 70–99)
Potassium: 4.3 mmol/L (ref 3.5–5.2)
Sodium: 141 mmol/L (ref 134–144)
Total Protein: 7.8 g/dL (ref 6.0–8.5)
eGFR: 92 mL/min/{1.73_m2} (ref >59)

## 2023-05-15 LAB — B12 AND FOLATE PANEL
Folate: 7.5 ng/mL (ref >3.0)
Vitamin B-12: 612 pg/mL (ref 232–1245)

## 2023-05-15 LAB — ACETAZOLAMIDE, (DIAMOX), S/P: Acetazolamide: 12.9 ug/mL

## 2023-05-15 LAB — TOPIRAMATE LEVEL: Topiramate Lvl: 6 ug/mL (ref 2.0–25.0)

## 2023-05-15 LAB — METHYLMALONIC ACID, SERUM: Methylmalonic Acid: 192 nmol/L (ref 0–378)

## 2023-05-15 LAB — TSH RFX ON ABNORMAL TO FREE T4: TSH: 0.715 u[IU]/mL (ref 0.450–4.500)

## 2023-05-20 ENCOUNTER — Telehealth: Payer: Self-pay | Admitting: *Deleted

## 2023-05-20 NOTE — Telephone Encounter (Signed)
 Spoke to mother of pt and relayed the results of her labs. I will forward to pcp as well. She verbalized understanding.

## 2023-05-20 NOTE — Telephone Encounter (Signed)
-----   Message from Onetha KATHEE Epp sent at 05/12/2023 12:35 PM EST ----- Pod4: let her parents know She has abnromalities on her labs but these are chronic and have not changed(stable) nothing new(for example her WBCs are elevated, her glucose is elevated) they can follow up non urgently with primary care to follow as they have been doing. Topiramate  level, thyroid , b12 within normal limits. Still awaiting acetazolamide  level but we will only call if abnormal (FYI Dr. Stephane)

## 2023-05-24 DIAGNOSIS — J9601 Acute respiratory failure with hypoxia: Secondary | ICD-10-CM | POA: Diagnosis not present

## 2023-05-27 ENCOUNTER — Ambulatory Visit (INDEPENDENT_AMBULATORY_CARE_PROVIDER_SITE_OTHER): Payer: Medicare HMO | Admitting: Internal Medicine

## 2023-05-27 DIAGNOSIS — F429 Obsessive-compulsive disorder, unspecified: Secondary | ICD-10-CM | POA: Diagnosis not present

## 2023-06-03 ENCOUNTER — Ambulatory Visit: Payer: Medicare HMO | Attending: Neurology

## 2023-06-03 DIAGNOSIS — R6250 Unspecified lack of expected normal physiological development in childhood: Secondary | ICD-10-CM | POA: Diagnosis not present

## 2023-06-03 DIAGNOSIS — R471 Dysarthria and anarthria: Secondary | ICD-10-CM | POA: Insufficient documentation

## 2023-06-03 DIAGNOSIS — R41841 Cognitive communication deficit: Secondary | ICD-10-CM | POA: Diagnosis not present

## 2023-06-03 NOTE — Therapy (Signed)
OUTPATIENT SPEECH LANGUAGE PATHOLOGY EVALUATION   Patient Name: Regina Wood MRN: [id number] DOB:07/30/1980, 43 y.o., female Today's Date: 06/03/2023  PCP: Melida Quitter MD REFERRING PROVIDER: Anson Fret, MD  END OF SESSION:  End of Session - 06/03/23 1459     Visit Number 1    Number of Visits 7    Date for SLP Re-Evaluation 07/15/23    Authorization Type [employer] Medicare/[employer]    SLP Start Time 1315    SLP Stop Time  1358    SLP Time Calculation (min) 43 min    Activity Tolerance Patient tolerated treatment well             Past Medical History:  Diagnosis Date   CTS (carpal tunnel syndrome)    Diabetes mellitus without complication (HCC)    Idiopathic intracranial hypertension    Obesity    Pneumonia    Past Surgical History:  Procedure Laterality Date   NO PAST SURGERIES     Patient Active Problem List   Diagnosis Date Noted   Class 3 severe obesity due to excess calories with serious comorbidity and body mass index (BMI) of 50.0 to 59.9 in adult (HCC) 03/20/2023   Type 2 diabetes mellitus with hyperglycemia, without long-term current use of insulin (HCC) 03/20/2023   Asthma 03/13/2023   IIH (idiopathic intracranial hypertension) 02/22/2023   Papilledema 02/22/2023   Severe headache 02/22/2023   Vision loss 02/22/2023   Diabetic renal disease (HCC) 01/29/2023   Skin sensation disturbance 01/29/2023   Suspected sleep apnea 04/19/2022   Chronic respiratory failure (HCC) 03/07/2022   Abnormal CT of the chest 12/31/2019   Intellectual disability 07/07/2019   Morbid (severe) obesity due to excess calories (HCC) 07/07/2019   Hyperglycemia 04/21/2019   HTN (hypertension) 04/21/2019   Developmental delay 04/21/2019    ONSET DATE: 05/04/2023 (referral date)   REFERRING DIAG: R62.50 (ICD-10-CM) - Developmental delay  THERAPY DIAG:  Dysarthria and anarthria  Cognitive communication deficit  Rationale for Evaluation and Treatment:  Habilitation  SUBJECTIVE:   SUBJECTIVE STATEMENT: Mom reported constant verbosity, repetition of same information, occasional word finding, and reduced speech intelligibility  Pt accompanied by: family members ([name]; [name])  PERTINENT HISTORY: Regina Wood is a 43 y.o. female with PMHX for developmental delay, diabetes, diabetic nephropathy.  PAIN:  Are you having pain? No  FALLS: Has patient fallen in last 6 months?  No  LIVING ENVIRONMENT: Lives with: lives with their family Lives in: House/apartment  PLOF:  Level of assistance: Needed assistance with ADLs, Needed assistance with IADLS Employment: Full-time employment  PATIENT GOALS: improve speech intelligibility   OBJECTIVE:  Note: Objective measures were completed at Evaluation unless otherwise noted.  COGNITION: Overall cognitive status: History of cognitive impairments - at baseline Areas of impairment:  Attention: Impaired: Focused, Sustained, Selective Memory: Impaired: Working Teacher, music term Behavior: Restless, Impulsive, and Poor frustration tolerance  AUDITORY COMPREHENSION: Overall auditory comprehension: Intact for simple conversation with repetition occasionally required  READING COMPREHENSION:n/a (unable to read beyond select words)  EXPRESSION: verbal  VERBAL EXPRESSION: Level of generative/spontaneous verbalization: conversation Automatic speech: name: intact and social response: intact  Naming: Confrontation: 76-100% Pragmatics: Impaired: topic appropriateness, topic maintenance, and turn taking Interfering components: attention and premorbid deficit Comments: Occasional anomia in conversation but could be vocabulary baseline or related to processing   WRITTEN EXPRESSION: Written expression: unable to write beyond name  MOTOR SPEECH: Overall motor speech: impaired Level of impairment: Word, Phrase, Sentence, and Conversation Respiration:  speaking on residual capacity Phonation:  normal Resonance: WFL Articulation: Impaired: word, phrase, sentence, and conversation Intelligibility: Intelligibility reduced Motor planning: Impaired: inconsistent Motor speech errors: unaware and inconsistent Interfering components: premorbid status Effective technique: slow rate  ORAL MOTOR EXAMINATION: Overall status: Impaired:   Lingual: Bilateral (Coordination)  STANDARDIZED ASSESSMENTS: Tikofsky 50 word intelligibility test (modified d/t illiterate): 48/50 given model  as pt unable to read                                                                                                   TREATMENT DATE:  06/03/23: Provided recommendations to aid communication effectiveness at home, including use of dysarthria strategies, providing opportunities for new opportunities to increase vocab and topics, cognitive linguistic stimulating tasks, and establishing boundaries for quiet time to aid pt awareness. Will trial short course of ST intervention to train dysarthria strategies, and provide communication recommendations, and assess ability to carryover independently.   PATIENT EDUCATION: Education details: POC, recommendations Person educated: Patient and Parent Education method: Explanation Education comprehension: verbalized understanding and needs further education   GOALS: Goals reviewed with patient? Yes  LONG TERM GOALS: Target date: 07/15/2023 (STG=LTGs)   Pt will carryover dysarthria strategies for short functional phrases with 90% accuracy given occasional mod A  Baseline:  Goal status: INITIAL  2.  Pt will carryover dysarthria strategies in personally relevant short conversations with 90% accuracy given occasional max A  Baseline:  Goal status: INITIAL  3.  Caregivers will carryover recommendations to aid communication effectiveness at home > 1 week  Baseline:  Goal status: INITIAL   ASSESSMENT:  CLINICAL IMPRESSION: Patient is a 43 y.o. F who was seen today for  speech concerns secondary to developmental delay. Has not had ST since exiting school (~20 years ago). Family reports persistent verbosity, repetitive language, reduced speech intelligibility, and occasional word finding. Pt rated ~30% intelligible in conversation despite context. Caregiver able to clarify occasional responses d/t familiarity. Trialed dysarthria strategies, in which pt able to demo at word level with success. Will trial short course of ST intervention to train dysarthria strategies and provide caregiver education to aid cognitive linguistic engagement and success at home.   OBJECTIVE IMPAIRMENTS: include expressive language. These impairments are limiting patient from effectively communicating at home and in community. Factors affecting potential to achieve goals and functional outcome are ability to learn/carryover information, co-morbidities, previous level of function, and severity of impairments. Patient will benefit from skilled SLP services to address above impairments and improve overall function.  REHAB POTENTIAL: Guarded d/t prior baseline  PLAN:  SLP FREQUENCY: 1x/week (schedule every other week per family request)   SLP DURATION: 6 weeks  PLANNED INTERVENTIONS: Language facilitation, Environmental controls, Cueing hierachy, Internal/external aids, Functional tasks, Multimodal communication approach, SLP instruction and feedback, Compensatory strategies, and Patient/family education    Gracy Racer, CCC-SLP 06/03/2023, 3:41 PM

## 2023-06-10 ENCOUNTER — Ambulatory Visit (INDEPENDENT_AMBULATORY_CARE_PROVIDER_SITE_OTHER): Payer: Medicare HMO | Admitting: Internal Medicine

## 2023-06-13 DIAGNOSIS — R6250 Unspecified lack of expected normal physiological development in childhood: Secondary | ICD-10-CM | POA: Diagnosis not present

## 2023-06-13 DIAGNOSIS — J069 Acute upper respiratory infection, unspecified: Secondary | ICD-10-CM | POA: Diagnosis not present

## 2023-06-13 DIAGNOSIS — J9601 Acute respiratory failure with hypoxia: Secondary | ICD-10-CM | POA: Diagnosis not present

## 2023-06-16 ENCOUNTER — Telehealth: Payer: Self-pay | Admitting: *Deleted

## 2023-06-16 NOTE — Therapy (Signed)
OUTPATIENT SPEECH LANGUAGE PATHOLOGY TREATMENT   Patient Name: Regina Wood MRN: [id number] DOB:20-Jun-1980, 43 y.o., female Today's Date: 06/17/2023  PCP: Melida Quitter MD REFERRING PROVIDER: Anson Fret, MD  END OF SESSION:  End of Session - 06/17/23 1451     Visit Number 2    Number of Visits 7    Date for SLP Re-Evaluation 07/15/23    Authorization Type A[employer]na Medicare/[employer]    SLP Start Time 1405    SLP Stop Time  1445    SLP Time Calculation (min) 40 min    Activity Tolerance Patient tolerated treatment well              Past Medical History:  Diagnosis Date   CTS (carpal tunnel syndrome)    Diabetes mellitus without complication (HCC)    Idiopathic intracranial hypertension    Obesity    Pneumonia    Past Surgical History:  Procedure Laterality Date   NO PAST SURGERIES     Patient Active Problem List   Diagnosis Date Noted   Class 3 severe obesity due to excess calories with serious comorbidity and body mass index (BMI) of 50.0 to 59.9 in adult (HCC) 03/20/2023   Type 2 diabetes mellitus with hyperglycemia, without long-term current use of insulin (HCC) 03/20/2023   Asthma 03/13/2023   IIH (idiopathic intracranial hypertension) 02/22/2023   Papilledema 02/22/2023   Severe headache 02/22/2023   Vision loss 02/22/2023   Diabetic renal disease (HCC) 01/29/2023   Skin sensation disturbance 01/29/2023   Suspected sleep apnea 04/19/2022   Chronic respiratory failure (HCC) 03/07/2022   Abnormal CT of the chest 12/31/2019   Intellectual disability 07/07/2019   Morbid (severe) obesity due to excess calories (HCC) 07/07/2019   Hyperglycemia 04/21/2019   HTN (hypertension) 04/21/2019   Developmental delay 04/21/2019    ONSET DATE: 05/04/2023 (referral date)   REFERRING DIAG: R62.50 (ICD-10-CM) - Developmental delay  THERAPY DIAG:  Dysarthria and anarthria  Cognitive communication deficit  Rationale for Evaluation and Treatment:  Habilitation  SUBJECTIVE:   SUBJECTIVE STATEMENT: "my mom says I talk too much. I get aggravated"  Pt accompanied by: family members ([name]; [name])  PERTINENT HISTORY: Regina Wood is a 43 y.o. female with PMHX for developmental delay, diabetes, diabetic nephropathy.  PAIN:  Are you having pain? No  FALLS: Has patient fallen in last 6 months?  No  LIVING ENVIRONMENT: Lives with: lives with their family Lives in: House/apartment  PLOF:  Level of assistance: Needed assistance with ADLs, Needed assistance with IADLS Employment: Full-time employment  PATIENT GOALS: improve speech intelligibility   OBJECTIVE:  Note: Objective measures were completed at Evaluation unless otherwise noted.                                                                                                   TREATMENT DATE:  06-17-23: Generated functional phrases to target carryover of dysarthria strategies as well as reduce communication breakdown and decrease pt frustration. Pt able to read and verbalize with 90% intelligibility today. Trained hand signals to aid clear communication for "  stop" and "slow." Pt able to demonstrate comprehension and augment communication with trained hand signal with usual fading to occasional modeling today. Pt able to slow rate in conversation with use of hand signal and verbal cues with good accuracy. Speech intelligibility rated ~80% in conversation today with use of trained techniques.   06/03/23: Provided recommendations to aid communication effectiveness at home, including use of dysarthria strategies, providing opportunities for new opportunities to increase vocab and topics, cognitive linguistic stimulating tasks, and establishing boundaries for quiet time to aid pt awareness. Will trial short course of ST intervention to train dysarthria strategies, and provide communication recommendations, and assess ability to carryover independently.   PATIENT  EDUCATION: Education details: POC, recommendations Person educated: Patient and Parent Education method: Explanation Education comprehension: verbalized understanding and needs further education   GOALS: Goals reviewed with patient? Yes  LONG TERM GOALS: Target date: 07/15/2023 (STG=LTGs)   Pt will carryover dysarthria strategies for short functional phrases with 90% accuracy given occasional mod A  Baseline:  Goal status: IN PROGRESS  2.  Pt will carryover dysarthria strategies in personally relevant short conversations with 90% accuracy given occasional max A  Baseline:  Goal status: IN PROGRESS  3.  Caregivers will carryover recommendations to aid communication effectiveness at home > 1 week  Baseline:  Goal status: IN PROGRESS   ASSESSMENT:  CLINICAL IMPRESSION: Patient is a 43 y.o. F who was seen today for speech concerns secondary to developmental delay. Has not had ST since exiting school (~20 years ago). Family reports persistent verbosity, repetitive language, reduced speech intelligibility, and occasional word finding. Trialed dysarthria strategies, in which pt able to demo at word level, sentence level, and discourse level with increasing success. Rated 90% intelligible for oral reading at phrase level and 80% intelligible in structured conversation. Will continue to trial short course of ST intervention to train dysarthria strategies and provide caregiver education to aid cognitive linguistic engagement and success at home.   OBJECTIVE IMPAIRMENTS: include expressive language. These impairments are limiting patient from effectively communicating at home and in community. Factors affecting potential to achieve goals and functional outcome are ability to learn/carryover information, co-morbidities, previous level of function, and severity of impairments. Patient will benefit from skilled SLP services to address above impairments and improve overall function.  REHAB  POTENTIAL: Guarded d/t prior baseline  PLAN:  SLP FREQUENCY: 1x/week (schedule every other week per family request)   SLP DURATION: 6 weeks  PLANNED INTERVENTIONS: Language facilitation, Environmental controls, Cueing hierachy, Internal/external aids, Functional tasks, Multimodal communication approach, SLP instruction and feedback, Compensatory strategies, and Patient/family education    Gracy Racer, CCC-SLP 06/17/2023, 2:51 PM

## 2023-06-16 NOTE — Telephone Encounter (Signed)
 Pt is scheduled for MRV/ MRI, needs within 30 day H and P form completed.  The pt is scheduled this Thursday for this.  PCP will not do as not ordering MD.  Can she see NP?  Can it be VV with Dr. Tresia Fruit.  Form to be faxed.

## 2023-06-17 ENCOUNTER — Encounter: Payer: Self-pay | Admitting: Neurology

## 2023-06-17 ENCOUNTER — Ambulatory Visit (INDEPENDENT_AMBULATORY_CARE_PROVIDER_SITE_OTHER): Payer: Medicare HMO | Admitting: Neurology

## 2023-06-17 ENCOUNTER — Ambulatory Visit: Payer: Medicare HMO | Attending: Neurology

## 2023-06-17 VITALS — BP 138/86 | HR 90 | Ht 59.5 in | Wt 262.0 lb

## 2023-06-17 DIAGNOSIS — R471 Dysarthria and anarthria: Secondary | ICD-10-CM | POA: Diagnosis not present

## 2023-06-17 DIAGNOSIS — G932 Benign intracranial hypertension: Secondary | ICD-10-CM | POA: Diagnosis not present

## 2023-06-17 DIAGNOSIS — R41841 Cognitive communication deficit: Secondary | ICD-10-CM | POA: Diagnosis not present

## 2023-06-17 DIAGNOSIS — F79 Unspecified intellectual disabilities: Secondary | ICD-10-CM

## 2023-06-17 NOTE — Patient Instructions (Addendum)
Hand signals = stop          = slow    Practice saying these phrases: Stop, I need a break.   I need to clean my room.   I feel aggravated.  I work at [employer].   I work 5 days a week.   I have cats and dogs.   I like to watch Netflix.   I like to play Candy Crush and Monopoly.   I have Diabetes.   Supernatural  [employer]  [employer] by the [employer]

## 2023-06-17 NOTE — Progress Notes (Signed)
Patient: Regina Wood Date of Birth: 1981-02-25  Reason for Visit: Follow up History from: Patient, mother Primary Neurologist: Lucia Gaskins   ASSESSMENT AND PLAN 43 y.o. year old female with papilledema and idiopathic intracranial hypertension. LP October 2024 opening pressure 37 cm water.  She has cognitive impairment at baseline.  Scheduled for MRI/MRV with sedation 06/19/23. Needs to be seen today for physical exam before the sedation.   -Patient is cleared from a neurological standpoint for sedation for her imaging study.  No significant abnormalities on physical exam.  She is obese.  Sleep apnea is suspected, but cannot undergo testing.  She does have cognitive impairment with some behaviors.  She was pleasant today, would not follow some commands.  Mother reports she exaggerates.  No medication changes.  On paperwork for medical clearance GLP receptor agonist should be held 7 days before anesthesia.  She is on [facility], due today but will hold until after procedure (last had on 06/10/23). VS are reassuring.   HISTORY OF PRESENT ILLNESS: Today 06/17/23 Here with her mom. Needs medical clearance for her MRV with sedation. She is taking 300 mg of Topamax at bedtime. Sees Dr. Dione Booze next week. Has missed 2 appointment at the weight loss center, going on 07/08/23. Last night complained of headache for 1st time in weeks. Diabetes reportedly under good control, on oral meds. On mounjaro, 10 lbs weight loss. Mom deals with behaviors from her, bad behavior, threats. She is intellectually disabled. Goes to day program. Goes to bathroom independently. Needs assistance with showering, dressing. Walking is good. Denies snores, but seems at risk for sleep apnea. Mentions her feet going to sleep. Wearing left wrist splint.   HISTORY  05/04/2023: She did not take her medicine, last week. Dr. Dione Booze, ophthalmologist. Her symptoms worsened when she did not take her medication. She saw Dr. Dione Booze. Parents here and  report most information. They saw she has difficulty with speech, talks all the time, interrupts and they would like therapy. No vision changes.    Patient complains of symptoms per HPI as well as the following symptoms: obesity . Pertinent negatives and positives per HPI. All others negative   03/12/2023: She had an LP it was OP 37. She felt much better only complained of headaches twice since the LP. Taking acetazolamide in the morning 9could not tolerate more) and now on topiramate 200mg  at bedtime and doing better. She missed her meds and the headaches worsend also the setting of couugh and cold. Whn taking medications feels better. She is on her meds and still getting headaches. Prior to the illness was doing well. She tried to take a sleep test but didn't slepe. Up at night up and down.    02/22/2023: Patient with papilledema dxed with IDIOPATHIC INTRACRANIAL HYPERTENSION here for urgent referral from Dr. Dione Booze, reviewed his notes she has papilledema, prior imaging and opening pressure consistent with IDIOPATHIC INTRACRANIAL HYPERTENSION. Here owth parents who are concerned, she has vision loss, She cannot tolerate acetazolamide. The medication makes her sick. She was prescribed 500mg  twice a day but she can only tolerate 250mg  a day. She is cognitively impaired, she gets very upset at the mention of surgery, discussed risks of permanent blindness, other options such as neurosurgery, optic nerve fenestration, LPs, other medication and significant risk of blindness. Had an extended conversation about iih and preovided reading materials I ma very concerned but surgery is not an immediate options after discussions with family due to patient's wishes.  REVIEW OF SYSTEMS: Out of a complete 14 system review of symptoms, the patient complains only of the following symptoms, and all other reviewed systems are negative.  See HPI  ALLERGIES: No Known Allergies  HOME MEDICATIONS: Outpatient Medications  Prior to Visit  Medication Sig Dispense Refill   acetaminophen (TYLENOL) 500 MG tablet Take 1,000 mg by mouth every 6 (six) hours as needed for moderate pain.     acetaZOLAMIDE (DIAMOX) 250 MG tablet Take 2 tablets (500 mg total) by mouth 2 (two) times daily. 120 tablet 0   albuterol (VENTOLIN HFA) 108 (90 Base) MCG/ACT inhaler Inhale 2 puffs into the lungs every 6 (six) hours as needed for wheezing or shortness of breath. 8 g 2   benztropine (COGENTIN) 1 MG tablet Take 1 mg by mouth at bedtime.     blood glucose meter kit and supplies Dispense based on patient and insurance preference. Use up to four times daily as directed. (FOR ICD-10 E10.9, E11.9). 1 each 0   dapagliflozin propanediol (FARXIGA) 5 MG TABS tablet Take 5 mg by mouth daily.     losartan (COZAAR) 100 MG tablet Take 100 mg by mouth daily.     metFORMIN (GLUCOPHAGE-XR) 750 MG 24 hr tablet Take 1,500 mg by mouth daily with breakfast.     MOUNJARO 5 MG/0.5ML Pen Inject 5 mg into the skin once a week.     nebivolol (BYSTOLIC) 2.5 MG tablet Take 2.5 mg by mouth daily.     ondansetron (ZOFRAN-ODT) 8 MG disintegrating tablet Take 1 tablet (8 mg total) by mouth every 8 (eight) hours as needed for nausea or vomiting. 10 tablet 0   pantoprazole (PROTONIX) 40 MG tablet TAKE 1 TABLET (40 MG TOTAL) BY MOUTH DAILY. TAKE 30-60 MIN BEFORE FIRST MEAL OF THE DAY (Patient not taking: Reported on 06/16/2023) 90 tablet 1   pregabalin (LYRICA) 200 MG capsule TAKE 1 CAPSULE BY MOUTH TWICE A DAY 60 capsule 5   Spacer/Aero-Holding Chambers (AEROCHAMBER MV) inhaler Use as instructed 1 each 0   topiramate (TOPAMAX) 100 MG tablet Take 2.5 tablets (250 mg total) by mouth at bedtime. Then can increase to 300mg  at bedtime if needed(3 tabs) (Patient taking differently: Take 100 mg by mouth at bedtime. Then can increase to 300mg  at bedtime if needed(3 tabs)) 270 tablet 3   famotidine (PEPCID) 20 MG tablet One after supper (Patient not taking: Reported on 06/16/2023) 30  tablet 11   No facility-administered medications prior to visit.    PAST MEDICAL HISTORY: Past Medical History:  Diagnosis Date   CTS (carpal tunnel syndrome)    Diabetes mellitus without complication (HCC)    Idiopathic intracranial hypertension    Obesity    Pneumonia     PAST SURGICAL HISTORY: Past Surgical History:  Procedure Laterality Date   NO PAST SURGERIES      FAMILY HISTORY: Family History  Problem Relation Age of Onset   Healthy Mother    Migraines Mother    Hypertension Father    Diabetes Father    Neuropathy Neg Hx    Pseudotumor cerebri Neg Hx     SOCIAL HISTORY: Social History   Socioeconomic History   Marital status: Single    Spouse name: Not on file   Number of children: Not on file   Years of education: Not on file   Highest education level: Not on file  Occupational History   Not on file  Tobacco Use   Smoking status: Never   Smokeless  tobacco: Never  Vaping Use   Vaping status: Never Used  Substance and Sexual Activity   Alcohol use: Yes    Comment: beer occ   Drug use: Never   Sexual activity: Not on file  Other Topics Concern   Not on file  Social History Narrative   Caffeine: in hot chocolate   Left handed   Lives at home with parents   Social Drivers of Health   Financial Resource Strain: Not on file  Food Insecurity: Not on file  Transportation Needs: Not on file  Physical Activity: Not on file  Stress: Not on file  Social Connections: Not on file  Intimate Partner Violence: Not on file    PHYSICAL EXAM  Vitals:   06/17/23 1504  BP: 138/86  Pulse: 90  Weight: 262 lb (118.8 kg)  Height: 4' 11.5" (1.511 m)   Body mass index is 52.03 kg/m.  Generalized: Well developed, in no acute distress  Neurological examination  Mentation: Alert oriented to time, place, intellectual disability, cognitive impairment.  Mother provides most history.  She is not completely cooperative with exam commands. Acts as if she cannot  perform. Interrupts. Asks the same questions.  Cranial nerve II-XII: Pupils were equal round reactive to light. Extraocular movements were full, visual field were full on confrontational test. Facial sensation and strength were normal.  Head turning and shoulder shrug  were normal and symmetric. Motor: The motor testing reveals 5 over 5 strength of all 4 extremities. Good symmetric motor tone is noted throughout.  Sensory: Sensory testing is intact to soft touch on all 4 extremities. No evidence of extinction is noted.  Coordination: Cerebellar testing reveals good finger-nose-finger and heel-to-shin bilaterally.  Gait and station: Gait is normal for body habitus and independent. Reflexes: Deep tendon reflexes are symmetric and normal bilaterally.   DIAGNOSTIC DATA (LABS, IMAGING, TESTING) - I reviewed patient records, labs, notes, testing and imaging myself where available.  Lab Results  Component Value Date   WBC 12.2 (H) 04/29/2023   HGB 15.0 04/29/2023   HCT 49.9 (H) 04/29/2023   MCV 77 (L) 04/29/2023   PLT 323 04/29/2023      Component Value Date/Time   NA 141 04/29/2023 1120   K 4.3 04/29/2023 1120   CL 101 04/29/2023 1120   CO2 26 04/29/2023 1120   GLUCOSE 117 (H) 04/29/2023 1120   GLUCOSE 205 (H) 02/07/2023 1224   BUN 11 04/29/2023 1120   CREATININE 0.82 04/29/2023 1120   CALCIUM 9.9 04/29/2023 1120   PROT 7.8 04/29/2023 1120   ALBUMIN 4.2 04/29/2023 1120   AST 11 04/29/2023 1120   ALT 9 04/29/2023 1120   ALKPHOS 130 (H) 04/29/2023 1120   BILITOT 0.3 04/29/2023 1120   GFRNONAA >60 02/07/2023 1209   GFRAA >60 04/21/2019 0610   Lab Results  Component Value Date   TRIG 104 04/20/2019   Lab Results  Component Value Date   HGBA1C 7.6 (H) 03/09/2022   Lab Results  Component Value Date   VITAMINB12 612 04/29/2023   Lab Results  Component Value Date   TSH 0.715 04/29/2023    Margie Ege, AGNP-C, DNP 06/17/2023, 3:20 PM [facility] [address] [place], [place] [address] [phone]

## 2023-06-18 ENCOUNTER — Other Ambulatory Visit: Payer: Self-pay

## 2023-06-18 ENCOUNTER — Telehealth: Payer: Self-pay | Admitting: Anesthesiology

## 2023-06-18 ENCOUNTER — Encounter (HOSPITAL_COMMUNITY): Payer: Self-pay

## 2023-06-18 NOTE — Anesthesia Preprocedure Evaluation (Addendum)
Anesthesia Evaluation  Patient identified by MRN, date of birth, ID band Patient awake    Reviewed: Allergy & Precautions, NPO status , Patient's Chart, lab work & pertinent test results  Airway Mallampati: III  TM Distance: >3 FB Neck ROM: Full    Dental no notable dental hx.    Pulmonary asthma , sleep apnea    Pulmonary exam normal breath sounds clear to auscultation       Cardiovascular Normal cardiovascular exam Rhythm:Regular Rate:Normal  03/2022 Echo   1. Left ventricular ejection fraction, by estimation, is 60 to 65%. The  left ventricle has normal function. The left ventricle has no regional  wall motion abnormalities. Left ventricular diastolic parameters were  normal.   2. Right ventricular systolic function is normal. The right ventricular  size is normal.   3. The mitral valve is normal in structure. No evidence of mitral valve  regurgitation. No evidence of mitral stenosis.   4. The aortic valve has an indeterminant number of cusps. Aortic valve  regurgitation is not visualized. No aortic stenosis is present.  - Comparison(s): No prior Echocardiogram.     Neuro/Psych  Neuromuscular disease    GI/Hepatic negative GI ROS,,,  Endo/Other  diabetes, Type 2, Oral Hypoglycemic Agents  Class 4 obesity  Renal/GU      Musculoskeletal   Abdominal  (+) + obese  Peds  Hematology   Anesthesia Other Findings   Reproductive/Obstetrics                             Anesthesia Physical Anesthesia Plan  ASA: 3  Anesthesia Plan: General   Post-op Pain Management: Tylenol PO (pre-op)*   Induction: Intravenous  PONV Risk Score and Plan: 4 or greater and Treatment may vary due to age or medical condition, Ondansetron and Midazolam  Airway Management Planned: LMA  Additional Equipment: None  Intra-op Plan:   Post-operative Plan: Extubation in OR  Informed Consent: I have reviewed  the patients History and Physical, chart, labs and discussed the procedure including the risks, benefits and alternatives for the proposed anesthesia with the patient or authorized representative who has indicated his/her understanding and acceptance.     Dental advisory given  Plan Discussed with: CRNA and Surgeon  Anesthesia Plan Comments: (See PAT note written 06/18/2023 by Shonna Chock, PA-C.  papilledema and idiopathic intracranial hypertension.  )       Anesthesia Quick Evaluation

## 2023-06-18 NOTE — Telephone Encounter (Signed)
H & P Form for Out-Patient Adult Sedation Procedures was faxed to Claudean Kinds at [facility] to [phone]. Received confirmation of receipt.

## 2023-06-18 NOTE — Progress Notes (Signed)
PCP - Melida Quitter, MD Pulmonology-Byrum, Molly Maduro, MD  Neurology-Ahern, Desma Maxim, MD  Cardiologist -   PPM/ICD - denies Device Orders - n/a Rep Notified - n/a  Chest x-ray - 01-29-23 EKG - 02-07-23 Stress Test - denies ECHO - 03-08-22 Cardiac Cath - denies  CPAP - denies  GLP-1 -MOUNJARO LAST DOSE 06-10-23  Fasting Blood Sugar - per patient's mom blood sugar this am 115 Checks Blood Sugar daily  Blood Thinner Instructions: denies Aspirin Instructions:   ERAS Protcol - NPO  COVID TEST- n/a  Anesthesia review: yes Idopathic intracranial Htn, Dm,   Patient verbally denies any shortness of breath, fever, cough and chest pain during phone call   -------------  SDW INSTRUCTIONS given:  Your procedure is scheduled on June 19, 2023.  Report to [facility] Main Entrance "A" at 5:30 A.M., and check in at the Admitting office.  Call this number if you have problems the morning of surgery:  [phone]   Remember:  Do not eat or drink after midnight the night before your surgery    Take these medicines the morning of surgery with A SIP OF WATER  acetaminophen (TYLENOL)  albuterol  inhaler  nebivolol (BYSTOLIC)  ondansetron (ZOFRAN-ODT)  pregabalin (LYRICA)   WHAT DO I DO ABOUT MY DIABETES MEDICATION?   Do not take oral diabetes medicines (pills) the morning of surgery.  The day of surgery, do not take other diabetes injectables, including Byetta (exenatide), Bydureon (exenatide ER), Victoza (liraglutide), or Trulicity (dulaglutide).  If your CBG is greater than 220 mg/dL, you may take  of your sliding scale (correction) dose of insulin.   HOW TO MANAGE YOUR DIABETES BEFORE AND AFTER SURGERY  Why is it important to control my blood sugar before and after surgery? Improving blood sugar levels before and after surgery helps healing and can limit problems. A way of improving blood sugar control is eating a healthy diet by:  Eating less sugar and carbohydrates   Increasing activity/exercise  Talking with your doctor about reaching your blood sugar goals High blood sugars (greater than 180 mg/dL) can raise your risk of infections and slow your recovery, so you will need to focus on controlling your diabetes during the weeks before surgery. Make sure that the doctor who takes care of your diabetes knows about your planned surgery including the date and location.  How do I manage my blood sugar before surgery? Check your blood sugar at least 4 times a day, starting 2 days before surgery, to make sure that the level is not too high or low.  Check your blood sugar the morning of your surgery when you wake up and every 2 hours until you get to the Short Stay unit.  If your blood sugar is less than 70 mg/dL, you will need to treat for low blood sugar: Do not take insulin. Treat a low blood sugar (less than 70 mg/dL) with  cup of clear juice (cranberry or apple), 4 glucose tablets, OR glucose gel. Recheck blood sugar in 15 minutes after treatment (to make sure it is greater than 70 mg/dL). If your blood sugar is not greater than 70 mg/dL on recheck, call [phone] for further instructions. Report your blood sugar to the short stay nurse when you get to Short Stay.  If you are admitted to the hospital after surgery: Your blood sugar will be checked by the staff and you will probably be given insulin after surgery (instead of oral diabetes medicines) to make sure you  have good blood sugar levels. The goal for blood sugar control after surgery is 80-180 mg/dL.  As of today, STOP taking any Aspirin (unless otherwise instructed by your surgeon) Aleve, Naproxen, Ibuprofen, Motrin, Advil, Goody's, BC's, all herbal medications, fish oil, and all vitamins.                      Do not wear jewelry, make up, or nail polish            Do not wear lotions, powders, perfumes/colognes, or deodorant.            Do not shave 48 hours prior to surgery.  Men may shave face  and neck.            Do not bring valuables to the hospital.            [facility] is not responsible for any belongings or valuables.  Do NOT Smoke (Tobacco/Vaping) 24 hours prior to your procedure If you use a CPAP at night, you may bring all equipment for your overnight stay.   Contacts, glasses, dentures or bridgework may not be worn into surgery.      For patients admitted to the hospital, discharge time will be determined by your treatment team.   Patients discharged the day of surgery will not be allowed to drive home, and someone needs to stay with them for 24 hours.    Special instructions:   [facility]- Preparing For Surgery  Before surgery, you can play an important role. Because skin is not sterile, your skin needs to be as free of germs as possible. You can reduce the number of germs on your skin by washing with CHG (chlorahexidine gluconate) Soap before surgery.  CHG is an antiseptic cleaner which kills germs and bonds with the skin to continue killing germs even after washing.    Oral Hygiene is also important to reduce your risk of infection.  Remember - BRUSH YOUR TEETH THE MORNING OF SURGERY WITH YOUR REGULAR TOOTHPASTE  Please do not use if you have an allergy to CHG or antibacterial soaps. If your skin becomes reddened/irritated stop using the CHG.  Do not shave (including legs and underarms) for at least 48 hours prior to first CHG shower. It is OK to shave your face.  Please follow these instructions carefully.   Shower the NIGHT BEFORE SURGERY and the MORNING OF SURGERY with DIAL Soap.   Pat yourself dry with a CLEAN TOWEL.  Wear CLEAN PAJAMAS to bed the night before surgery  Place CLEAN SHEETS on your bed the night of your first shower and DO NOT SLEEP WITH PETS.   Day of Surgery: Please shower morning of surgery  Wear Clean/Comfortable clothing the morning of surgery Do not apply any deodorants/lotions.   Remember to brush your teeth WITH YOUR  REGULAR TOOTHPASTE.   Questions were answered. Patient verbalized understanding of instructions.

## 2023-06-18 NOTE — Progress Notes (Signed)
Anesthesia Chart Review: SAME DAY WORK-UP  Date/Time: 06/19/23 0800   Procedure: MR MRV HEAD W WO CONTRAST   Diagnosis:      Other vascular headache [G44.1]     Transverse sinus thrombosis [G08]     Intracranial hypertension [G93.2]   Location: [facility] MRI       DISCUSSION: Patient is a 43 year old female scheduled for the above procedure. She is followed by neurology and ophthalmology for papilledema and idiopathic intracranial hypertension. CT Venogram in October 2024 showed a filling defect in the lateral aspects of both transverse sinus favored to reflect artifact but could not exclude thrombus. MRV recommended, but per Dr. Trevor Mace telephone encounter, "She has intellectual disabilities and is so scared [of MRV]." Mom agreeable to scheduled with sedation. She is deathly afraid of MRI/MRV."  She had H&P and neurology clearance visit by Margie Ege, NP on 06/17/23.   History includes never smoker, DM2, cognitive impairment, papilledema with idiopathic intracranial hypertension (last LP 02/2023 opening pressure 37, on Diamox), morbid obesity. Suspected undiagnosed OSA, but unsuccessful attempt at sleep study in January 2024. She uses nocturnal O2.   Last pulmonology follow-up was on 03/13/23 with Dr. Delton Coombes. Followed for DOE, suspected asthma, and for abnormal chest CT in 2020 with areas of patchy bilateral groundglass consistent with either pneumonitis or air trapping. Findings stable on serial CT imaging. He suspected related to air trapping. She had not been able to complete formal PFTs. She did not find Dulera beneficial, but has albuterol as needed. He was holding off on dedicated chest CT unless clinical change.  One year follow-up planned.   A1c 7.5% in July 2024 at [facility]). She is on Fargixa, metformin, Mounjaro. PAT RN to verify last Farxiga dose. Last Mounjaro dose documented as 06/10/23 and advised to hold until after her procedure per recent  neurology visit.    Anesthesia team to evaluate on the day of procedure.    VS: LMP 10/28/2019 (Approximate)  Wt Readings from Last 3 Encounters:  06/17/23 118.8 kg  04/29/23 118.4 kg  03/20/23 117.9 kg   BP Readings from Last 3 Encounters:  06/17/23 138/86  04/29/23 115/77  03/20/23 116/79   Pulse Readings from Last 3 Encounters:  06/17/23 90  04/29/23 90  03/20/23 70     PROVIDERS: Melida Quitter, MD is PCP  Levy Pupa, MD is pulmonologist Naomie Dean, MD is neurologist   LABS: For day of procedure as indicated. Most recent results in [facility] include: Lab Results  Component Value Date   WBC 12.2 (H) 04/29/2023   HGB 15.0 04/29/2023   HCT 49.9 (H) 04/29/2023   PLT 323 04/29/2023   GLUCOSE 117 (H) 04/29/2023   TRIG 104 04/20/2019   ALT 9 04/29/2023   AST 11 04/29/2023   NA 141 04/29/2023   K 4.3 04/29/2023   CL 101 04/29/2023   CREATININE 0.82 04/29/2023   BUN 11 04/29/2023   CO2 26 04/29/2023   TSH 0.715 04/29/2023   INR 0.9 02/07/2023  A1c 7.5% on 11/26/22 ([facility] CE).   OTHER: Home Sleep Study 05/21/22: Impressions: Suboptimal study with multiple periods of loss of signals from airflow sensor. Loss of signals from oximetry monitor and heart rate monitor. Recommendations: Suboptimal for the determination of significant sleep disordered breathing.  If significant concern for sleep disordered breathing, suggest scheduling for an in lab polysomnogram.     IMAGES: CT Venogram 02/24/23:  IMPRESSION: 1. Apparent nonocclusive filling defects in the lateral  aspects of both transverse sinuses are favored to reflect artifact or prominent arachnoid granulations; however, nonocclusive thrombus can not be excluded. Recommend MRV with and without contrast for further evaluation. 2. Unchanged partially empty sella.  CTV showed possible filling defects which could be artifacts but we need to make sure they are not clots. Would patient consider an MRi of the veins?  If not we can hold off until lumbar puncture or try something else like sending to Dr. Corliss Skains for for a cerebral Venograph? Did theu get the LP scheduled? We can also sedate her for the MRV (MRI to look at the veins) thanks, sorry I know how scared she is of the MRi.   CT Head 02/07/23: MPRESSION: No acute intracranial abnormality.  CXR 01/29/23: IMPRESSION: Streaky and patchy right infrahilar opacity, possible atelectasis or mild pneumonia   CT Chest 12/05/22: FINDINGS: - Cardiovascular: Heart is enlarged.  No pericardial effusion. - Mediastinum/Nodes: No pathologically enlarged mediastinal or axillary lymph nodes. Hilar regions are difficult to definitively evaluate without IV contrast. - Lungs/Pleura: Image quality is degraded by expiratory phase imaging which create added density and mosaic attenuation in the lungs. Minimal scarring in the right middle lobe and lingula. No pleural fluid. Airway is otherwise unremarkable. - Upper Abdomen: Visualized portions of the liver, adrenal glands, right kidney, spleen, pancreas and stomach are grossly unremarkable. - Musculoskeletal: Degenerative changes in the spine. No worrisome lytic or sclerotic lesions. IMPRESSION: No acute findings.   EKG: 02/10/23: SR at 80 bpm   CV: Echo 03/08/22: IMPRESSIONS   1. Left ventricular ejection fraction, by estimation, is 60 to 65%. The  left ventricle has normal function. The left ventricle has no regional  wall motion abnormalities. Left ventricular diastolic parameters were  normal.   2. Right ventricular systolic function is normal. The right ventricular  size is normal.   3. The mitral valve is normal in structure. No evidence of mitral valve  regurgitation. No evidence of mitral stenosis.   4. The aortic valve has an indeterminant number of cusps. Aortic valve  regurgitation is not visualized. No aortic stenosis is present.  - Comparison(s): No prior Echocardiogram.    Past Medical  History:  Diagnosis Date   CTS (carpal tunnel syndrome)    Diabetes mellitus without complication (HCC)    Idiopathic intracranial hypertension    Obesity    Pneumonia     Past Surgical History:  Procedure Laterality Date   NO PAST SURGERIES      MEDICATIONS:  pantoprazole (PROTONIX) 40 MG tablet   acetaminophen (TYLENOL) 500 MG tablet   acetaZOLAMIDE (DIAMOX) 250 MG tablet   albuterol (VENTOLIN HFA) 108 (90 Base) MCG/ACT inhaler   benztropine (COGENTIN) 1 MG tablet   blood glucose meter kit and supplies   dapagliflozin propanediol (FARXIGA) 5 MG TABS tablet   famotidine (PEPCID) 20 MG tablet   losartan (COZAAR) 100 MG tablet   metFORMIN (GLUCOPHAGE-XR) 750 MG 24 hr tablet   MOUNJARO 5 MG/0.5ML Pen   nebivolol (BYSTOLIC) 2.5 MG tablet   ondansetron (ZOFRAN-ODT) 8 MG disintegrating tablet   pregabalin (LYRICA) 200 MG capsule   Spacer/Aero-Holding Chambers (AEROCHAMBER MV) inhaler   topiramate (TOPAMAX) 100 MG tablet   No current facility-administered medications for this encounter.    Shonna Chock, PA-C Surgical Short Stay/Anesthesiology [facility] Phone [phone] [facility] Phone [phone] 06/18/2023 10:03 AM

## 2023-06-19 ENCOUNTER — Ambulatory Visit (HOSPITAL_COMMUNITY): Payer: Self-pay | Admitting: Vascular Surgery

## 2023-06-19 ENCOUNTER — Ambulatory Visit (HOSPITAL_BASED_OUTPATIENT_CLINIC_OR_DEPARTMENT_OTHER): Payer: Self-pay | Admitting: Vascular Surgery

## 2023-06-19 ENCOUNTER — Encounter (HOSPITAL_COMMUNITY): Payer: Self-pay | Admitting: *Deleted

## 2023-06-19 ENCOUNTER — Encounter (HOSPITAL_COMMUNITY)
Admission: RE | Disposition: A | Discharge status: Home / Self Care | Payer: Self-pay | Source: Home / Self Care | Attending: *Deleted

## 2023-06-19 ENCOUNTER — Ambulatory Visit (HOSPITAL_COMMUNITY)
Admission: RE | Admit: 2023-06-19 | Discharge: 2023-06-19 | Disposition: A | Discharge status: Home / Self Care | Payer: Medicare HMO | Attending: *Deleted | Admitting: *Deleted

## 2023-06-19 ENCOUNTER — Ambulatory Visit (HOSPITAL_COMMUNITY)
Admission: RE | Admit: 2023-06-19 | Discharge: 2023-06-19 | Disposition: A | Discharge status: Home / Self Care | Payer: Medicare HMO | Source: Ambulatory Visit | Attending: Neurology | Admitting: *Deleted

## 2023-06-19 DIAGNOSIS — G08 Intracranial and intraspinal phlebitis and thrombophlebitis: Secondary | ICD-10-CM

## 2023-06-19 DIAGNOSIS — G932 Benign intracranial hypertension: Secondary | ICD-10-CM

## 2023-06-19 DIAGNOSIS — G441 Vascular headache, not elsewhere classified: Secondary | ICD-10-CM | POA: Insufficient documentation

## 2023-06-19 DIAGNOSIS — E669 Obesity, unspecified: Secondary | ICD-10-CM | POA: Diagnosis not present

## 2023-06-19 DIAGNOSIS — Z6841 Body Mass Index (BMI) 40.0 and over, adult: Secondary | ICD-10-CM | POA: Insufficient documentation

## 2023-06-19 DIAGNOSIS — Z79899 Other long term (current) drug therapy: Secondary | ICD-10-CM | POA: Diagnosis not present

## 2023-06-19 DIAGNOSIS — Z7984 Long term (current) use of oral hypoglycemic drugs: Secondary | ICD-10-CM | POA: Diagnosis not present

## 2023-06-19 DIAGNOSIS — E119 Type 2 diabetes mellitus without complications: Secondary | ICD-10-CM | POA: Diagnosis not present

## 2023-06-19 DIAGNOSIS — H4710 Unspecified papilledema: Secondary | ICD-10-CM | POA: Insufficient documentation

## 2023-06-19 DIAGNOSIS — J45909 Unspecified asthma, uncomplicated: Secondary | ICD-10-CM | POA: Diagnosis not present

## 2023-06-19 HISTORY — PX: RADIOLOGY WITH ANESTHESIA: SHX6223

## 2023-06-19 LAB — GLUCOSE, CAPILLARY
Glucose-Capillary: 99 mg/dL (ref 70–99)
Glucose-Capillary: 127 mg/dL — ABNORMAL HIGH (ref 70–99)

## 2023-06-19 SURGERY — MRI WITH ANESTHESIA
Anesthesia: General

## 2023-06-19 MED ORDER — GADOBUTROL 1 MMOL/ML IV SOLN
10.0000 mL | Freq: Once | INTRAVENOUS | Status: AC | PRN
  Administered 2023-06-19: 10 mL via INTRAVENOUS

## 2023-06-19 MED ORDER — LIDOCAINE 2% (20 MG/ML) 5 ML SYRINGE
INTRAMUSCULAR | Status: DC | PRN
  Administered 2023-06-19: 60 mg via INTRAVENOUS

## 2023-06-19 MED ORDER — ORAL CARE MOUTH RINSE: 15.0000 mL | Freq: Once | OROMUCOSAL | Status: AC

## 2023-06-19 MED ORDER — FENTANYL CITRATE (PF) 250 MCG/5ML IJ SOLN
INTRAMUSCULAR | Status: AC
  Filled 2023-06-19: qty 5

## 2023-06-19 MED ORDER — CHLORHEXIDINE GLUCONATE 0.12 % MT SOLN
OROMUCOSAL | Status: AC
  Administered 2023-06-19: 15 mL via OROMUCOSAL
  Filled 2023-06-19: qty 15

## 2023-06-19 MED ORDER — SODIUM CHLORIDE 0.9% FLUSH: 3.0000 mL | INTRAVENOUS | Status: DC | PRN

## 2023-06-19 MED ORDER — SODIUM CHLORIDE 0.9% FLUSH: 3.0000 mL | Freq: Two times a day (BID) | INTRAVENOUS | Status: DC

## 2023-06-19 MED ORDER — MIDAZOLAM HCL 2 MG/2ML IJ SOLN
INTRAMUSCULAR | Status: AC
  Filled 2023-06-19: qty 2

## 2023-06-19 MED ORDER — CHLORHEXIDINE GLUCONATE 0.12 % MT SOLN: 15.0000 mL | Freq: Once | OROMUCOSAL | Status: AC

## 2023-06-19 MED ORDER — INSULIN ASPART 100 UNIT/ML IJ SOLN: 0.0000 [IU] | INTRAMUSCULAR | Status: DC | PRN

## 2023-06-19 MED ORDER — PROPOFOL 10 MG/ML IV BOLUS
INTRAVENOUS | Status: DC | PRN
  Administered 2023-06-19: 200 mg via INTRAVENOUS

## 2023-06-19 MED ORDER — SODIUM CHLORIDE 0.9 % IV SOLN
INTRAVENOUS | Status: DC | PRN
Start: 2023-06-19 — End: 2023-06-19

## 2023-06-19 NOTE — Anesthesia Procedure Notes (Signed)
Procedure Name: LMA Insertion Date/Time: 06/19/2023 8:17 AM  Performed by: Gus Puma, CRNAPre-anesthesia Checklist: Patient identified, Emergency Drugs available, Suction available and Patient being monitored Patient Re-evaluated:Patient Re-evaluated prior to induction Oxygen Delivery Method: Circle System Utilized Preoxygenation: Pre-oxygenation with 100% oxygen Induction Type: IV induction Ventilation: Mask ventilation without difficulty LMA: LMA with gastric port inserted LMA Size: 4.0 Number of attempts: 1 Airway Equipment and Method: Bite block Placement Confirmation: positive ETCO2 Tube secured with: Tape Dental Injury: Teeth and Oropharynx as per pre-operative assessment

## 2023-06-19 NOTE — Anesthesia Postprocedure Evaluation (Signed)
Anesthesia Post Note  Patient: Regina Wood  Procedure(s) Performed: MR MRV HEAD W WO CONTRAST MRI/MRV HEAD WITH AND WITOUT CONTRAST WITH ANESTHESIA     Patient location during evaluation: PACU Anesthesia Type: General Level of consciousness: awake and alert Pain management: pain level controlled Vital Signs Assessment: post-procedure vital signs reviewed and stable Respiratory status: spontaneous breathing, nonlabored ventilation, respiratory function stable and patient connected to nasal cannula oxygen Cardiovascular status: blood pressure returned to baseline and stable Postop Assessment: no apparent nausea or vomiting Anesthetic complications: no  No notable events documented.  Last Vitals:  Vitals:   06/19/23 0915 06/19/23 0930  BP: 105/88 127/72  Pulse: 85 84  Resp: 20 (!) 21  Temp:  36.5 C  SpO2: 93% 94%    Last Pain:  Vitals:   06/19/23 0556  TempSrc: Oral                 Trevor Iha

## 2023-06-19 NOTE — Interval H&P Note (Signed)
Anesthesia H&P Update: History and Physical Exam reviewed; patient is OK for planned anesthetic and procedure. ? ?

## 2023-06-19 NOTE — Addendum Note (Signed)
Addendum  created 06/19/23 1146 by Trevor Iha, MD   Review and Sign - Ready for Procedure

## 2023-06-19 NOTE — Transfer of Care (Signed)
Immediate Anesthesia Transfer of Care Note  Patient: Regina Wood  Procedure(s) Performed: MR MRV HEAD W WO CONTRAST MRI/MRV HEAD WITH AND WITOUT CONTRAST WITH ANESTHESIA  Patient Location: PACU  Anesthesia Type:General  Level of Consciousness: drowsy and patient cooperative  Airway & Oxygen Therapy: Patient Spontanous Breathing  Post-op Assessment: Report given to RN and Post -op Vital signs reviewed and stable  Post vital signs: Reviewed and stable  Last Vitals:  Vitals Value Taken Time  BP 130/98 06/19/23 0911  Temp    Pulse 86 06/19/23 0915  Resp 26 06/19/23 0915  SpO2 88 % 06/19/23 0915  Vitals shown include unfiled device data.  Last Pain:  Vitals:   06/19/23 0556  TempSrc: Oral         Complications: No notable events documented.

## 2023-06-20 ENCOUNTER — Encounter (HOSPITAL_COMMUNITY): Payer: Self-pay | Admitting: Radiology

## 2023-06-20 LAB — POCT PREGNANCY, URINE: Preg Test, Ur: NEGATIVE

## 2023-06-24 DIAGNOSIS — H4710 Unspecified papilledema: Secondary | ICD-10-CM | POA: Diagnosis not present

## 2023-06-24 DIAGNOSIS — E119 Type 2 diabetes mellitus without complications: Secondary | ICD-10-CM | POA: Diagnosis not present

## 2023-06-24 DIAGNOSIS — H40033 Anatomical narrow angle, bilateral: Secondary | ICD-10-CM | POA: Diagnosis not present

## 2023-06-24 DIAGNOSIS — H04123 Dry eye syndrome of bilateral lacrimal glands: Secondary | ICD-10-CM | POA: Diagnosis not present

## 2023-06-24 DIAGNOSIS — J9601 Acute respiratory failure with hypoxia: Secondary | ICD-10-CM | POA: Diagnosis not present

## 2023-07-01 DIAGNOSIS — I10 Essential (primary) hypertension: Secondary | ICD-10-CM | POA: Diagnosis not present

## 2023-07-01 DIAGNOSIS — E1121 Type 2 diabetes mellitus with diabetic nephropathy: Secondary | ICD-10-CM | POA: Diagnosis not present

## 2023-07-08 ENCOUNTER — Ambulatory Visit (INDEPENDENT_AMBULATORY_CARE_PROVIDER_SITE_OTHER): Payer: Medicare HMO | Admitting: Family Medicine

## 2023-07-08 ENCOUNTER — Encounter (INDEPENDENT_AMBULATORY_CARE_PROVIDER_SITE_OTHER): Payer: Self-pay | Admitting: Family Medicine

## 2023-07-08 VITALS — BP 113/77 | HR 84 | Temp 97.8°F | Ht 60.0 in | Wt 254.0 lb

## 2023-07-08 DIAGNOSIS — R0602 Shortness of breath: Secondary | ICD-10-CM | POA: Diagnosis not present

## 2023-07-08 DIAGNOSIS — E1169 Type 2 diabetes mellitus with other specified complication: Secondary | ICD-10-CM

## 2023-07-08 DIAGNOSIS — E1159 Type 2 diabetes mellitus with other circulatory complications: Secondary | ICD-10-CM

## 2023-07-08 DIAGNOSIS — E1165 Type 2 diabetes mellitus with hyperglycemia: Secondary | ICD-10-CM

## 2023-07-08 DIAGNOSIS — Z7984 Long term (current) use of oral hypoglycemic drugs: Secondary | ICD-10-CM | POA: Diagnosis not present

## 2023-07-08 DIAGNOSIS — E785 Hyperlipidemia, unspecified: Secondary | ICD-10-CM | POA: Diagnosis not present

## 2023-07-08 DIAGNOSIS — Z6841 Body Mass Index (BMI) 40.0 and over, adult: Secondary | ICD-10-CM | POA: Diagnosis not present

## 2023-07-08 DIAGNOSIS — E559 Vitamin D deficiency, unspecified: Secondary | ICD-10-CM | POA: Diagnosis not present

## 2023-07-08 DIAGNOSIS — Z1331 Encounter for screening for depression: Secondary | ICD-10-CM | POA: Diagnosis not present

## 2023-07-08 DIAGNOSIS — R5383 Other fatigue: Secondary | ICD-10-CM | POA: Diagnosis not present

## 2023-07-08 DIAGNOSIS — I152 Hypertension secondary to endocrine disorders: Secondary | ICD-10-CM

## 2023-07-08 NOTE — Assessment & Plan Note (Signed)
 Patient has a history of hyperlipidemia. She is not on medication currently.  She has not had a lipid panel in epic- will draw flp today.

## 2023-07-08 NOTE — Progress Notes (Signed)
 Chief Complaint:  Obesity   Subjective:  Regina Wood (MR# [id number]) is a 43 y.o. female who presents for evaluation and treatment of obesity and related comorbidities.   Regina Wood is currently in the action stage of change and ready to dedicate time achieving and maintaining a healthier weight. Regina Wood is interested in becoming our patient and working on intensive lifestyle modifications including (but not limited to) diet and exercise for weight loss.  Regina Wood has been struggling with her weight. She has been unsuccessful in either losing weight, maintaining weight loss, or reaching her healthy weight goal.  Works 21 hours a week at [employer].  Lives at home with [name], aunt Delight Stare, and niece Dorathy Daft.  Her family is supportive of her and will be changing how they eat but they do not eat meals together. Started gaining weight after her twin died in August 23, 2002.  Steady weight gain since that time.    Eats outside the home 5-6 times a week at [facility] and gets chicken nuggets, fries and a drink.  Dislikes almost all meat except hamburger and chicken nuggets.  Dislikes almost all vegetables.  Food Recall: Starts with water in the am. Half bagel with cream cheese and orange juice and hot chocolate. Satisfied from this.  Lunch at work- [employer], [employer] or [employer]- [employer] chicken nuggets and french fries, [employer] fries, mozzarella sticks and drink.  Feels satisfied but mom reports she states she feels full at that time.  Dinner is green peas, half baked potato but very occasional she will eat something made from ground beef.  She will eat sandwiches with [place], mayo, cheese and pickles.   Indirect Calorimeter completed today shows a RMR: August 22, 2072.  Other Fatigue Regina Wood admits to daytime somnolence and admits to waking up still tired. Patient has a history of symptoms of daytime fatigue. Anaria generally gets 4.5-6 hours of sleep per night, and states that she has difficulty falling  asleep. Snoring is not present. Apneic episodes is not present. Epworth Sleepiness Score is 9.   Shortness of Breath Regina Wood notes increasing shortness of breath with exercising and seems to be worsening over time with weight gain. She notes getting out of breath sooner with activity than she used to. This has not gotten worse recently. Regina Wood denies shortness of breath at rest or orthopnea.  Depression Screen Regina Wood's Food and Mood (modified PHQ-9) score was 5.     07/08/2023    7:21 AM  Depression screen PHQ 2/9  Decreased Interest 0  Down, Depressed, Hopeless 0  PHQ - 2 Score 0  Altered sleeping 3  Tired, decreased energy 3  Change in appetite 3  Feeling bad or failure about yourself  0  Trouble concentrating 0  Moving slowly or fidgety/restless 0  Suicidal thoughts 0  PHQ-9 Score 9     Objective:  No data recorded     07/08/2023    6:00 AM 06/19/2023    9:30 AM 06/19/2023    9:15 AM  Vitals with BMI  Height 5\' 0"     Weight 254 lbs    BMI 49.61    Systolic 113 127 440  Diastolic 77 72 88  Pulse 84 84 85    No data recorded   EKG: Normal sinus rhythm, rate 80.  General: Cooperative, alert, well developed, in no acute distress. HEENT: Conjunctivae and lids unremarkable. Cardiovascular: Regular rhythm.  Lungs: Normal work of breathing. Neurologic: No focal deficits.   Lab Results  Component Value Date  CREATININE 0.77 07/08/2023   BUN 10 07/08/2023   NA 141 07/08/2023   K 4.0 07/08/2023   CL 104 07/08/2023   CO2 22 07/08/2023   Lab Results  Component Value Date   ALT 12 07/08/2023   AST 12 07/08/2023   ALKPHOS 138 (H) 07/08/2023   BILITOT 0.3 07/08/2023   Lab Results  Component Value Date   HGBA1C 7.6 (H) 03/09/2022   HGBA1C 8.7 (H) 04/21/2019   Lab Results  Component Value Date   INSULIN 5.3 07/08/2023   Lab Results  Component Value Date   TSH 0.895 07/08/2023   Lab Results  Component Value Date   CHOL 99 (L) 07/08/2023   HDL 37  (L) 07/08/2023   LDLCALC 47 07/08/2023   TRIG 72 07/08/2023   Lab Results  Component Value Date   WBC 12.2 (H) 04/29/2023   HGB 15.0 04/29/2023   HCT 49.9 (H) 04/29/2023   MCV 77 (L) 04/29/2023   PLT 323 04/29/2023   Lab Results  Component Value Date   FERRITIN 25 04/20/2019    Assessment and Plan:   Other Fatigue  Regina Wood does feel that her weight is causing her energy to be lower than it should be. Fatigue may be related to obesity, depression or many other causes. Labs will be ordered, and in the meanwhile, Regina Wood will focus on self care including making healthy food choices, increasing physical activity and focusing on stress reduction.  Shortness of Breath  Regina Wood does feel that she gets out of breath more easily that she used to when she exercises. 's shortness of breath appears to be obesity related and exercise induced. She has agreed to work on weight loss and gradually increase exercise to treat her exercise induced shortness of breath. Will continue to monitor closely.   Problem List Items Addressed This Visit       Cardiovascular and Mediastinum   Hypertension associated with diabetes [facility])   Patient has had diagnosis for the last few years.  She is on combination medication of nebivolol, losartan and blood pressure is well controlled.  CMP ordered today.      Relevant Orders   Comprehensive metabolic panel (Completed)     Endocrine   Type 2 diabetes mellitus with hyperglycemia, without long-term current use of insulin (HCC)   Patient on farxiga and metformin.  She previously tried [place] but voices that she got sick but could not further elaborate.  She did not like taking the shot.  Mother reports she is still taking Mounjaro.  Last A1c a few weeks ago was 6.1 per mother.  She is not experiencing any side effects of her current medication.  Diagnosed a few years ago.  She is on pregabalin for diabetic neuropathy.       Relevant Orders   Insulin,  random (Completed)   Vitamin B12 (Completed)   Hyperlipidemia associated with type 2 diabetes mellitus (HCC)   Patient has a history of hyperlipidemia. She is not on medication currently.  She has not had a lipid panel in epic- will draw flp today.       Relevant Orders   Lipid Panel With LDL/HDL Ratio (Completed)     Other   Morbid (severe) obesity due to excess calories (HCC)   Anthropometric Measurements Height: 5' (1.524 m) Weight: 254 lb (115.2 kg) BMI (Calculated): 49.61 Starting Weight: 254 lb Waist Measurement : 54 inches Other Clinical Data RMR: 2074 Fasting: yes Labs: yes Today's Visit #: 1 Starting Date:  07/08/23       Vitamin D deficiency   Diagnosis likely given other medical comorbidities.  Vitamin D level today.      Relevant Orders   VITAMIN D 25 Hydroxy (Vit-D Deficiency, Fractures) (Completed)   Other Visit Diagnoses       Other fatigue    -  Primary   Relevant Orders   Thyroid Panel With TSH (Completed)     SOBOE (shortness of breath on exertion)         Depression screening         BMI 45.0-49.9, adult (HCC)         Obesity with starting BMI of 49.6           Regina Wood is currently in the action stage of change and her goal is to continue with weight loss efforts. I recommend Varie begin the structured treatment plan as follows:  She has agreed to Category 3 Plan  Exercise goals: No exercise has been prescribed at this time.  Behavioral modification strategies:increasing lean protein intake, no skipping meals, meal planning and cooking strategies, ways to avoid nighttime snacking, and better snacking choices  She was informed of the importance of frequent follow-up visits to maximize her success with intensive lifestyle modifications for her multiple health conditions. She was informed we would discuss her lab results at her next visit unless there is a critical issue that needs to be addressed sooner. Regina Wood agreed to keep her next visit  at the agreed upon time to discuss these results.  Labs ordered with plans to discuss at the next visit.   Attestation Statements:  Reviewed by clinician on day of visit: allergies, medications, problem list, medical history, surgical history, family history, social history, and previous encounter notes. This is the patient's first visit at [facility]. The patient's NEW PATIENT PACKET was reviewed at length. Included in the packet: current and past health history, medications, allergies, ROS, gynecologic history (women only), surgical history, family history, social history, weight history, weight loss surgery history (for those that have had weight loss surgery), nutritional evaluation, mood and food questionnaire, PHQ9, Epworth questionnaire, sleep habits questionnaire, patient life and health improvement goals questionnaire. These will all be scanned into the patient's chart under media.   During the visit, I independently reviewed the patient's EKG, bioimpedance scale results, and indirect calorimeter results. I used this information to tailor a meal plan for the patient that will help her to lose weight and will improve her obesity-related conditions going forward. I performed a medically necessary appropriate examination and/or evaluation. I discussed the assessment and treatment plan with the patient. The patient was provided an opportunity to ask questions and all were answered. The patient agreed with the plan and demonstrated an understanding of the instructions. Labs were ordered at this visit and will be reviewed at the next visit unless more critical results need to be addressed immediately. Clinical information was updated and documented in the EMR.     Time spent on visit including pre-visit chart review and post-visit charting and care was 45 minutes.   Reuben Likes, MD

## 2023-07-08 NOTE — Assessment & Plan Note (Addendum)
 Patient on farxiga and metformin.  She previously tried [place] but voices that she got sick but could not further elaborate.  She did not like taking the shot.  Mother reports she is still taking Mounjaro.  Last A1c a few weeks ago was 6.1 per mother.  She is not experiencing any side effects of her current medication.  Diagnosed a few years ago.  She is on pregabalin for diabetic neuropathy.

## 2023-07-08 NOTE — Assessment & Plan Note (Signed)
 Patient has had diagnosis for the last few years.  She is on combination medication of nebivolol, losartan and blood pressure is well controlled.  CMP ordered today.

## 2023-07-10 LAB — COMPREHENSIVE METABOLIC PANEL
ALT: 12 IU/L (ref 0–32)
AST: 12 IU/L (ref 0–40)
Albumin: 3.9 g/dL (ref 3.9–4.9)
Alkaline Phosphatase: 138 IU/L — ABNORMAL HIGH (ref 44–121)
BUN/Creatinine Ratio: 13 (ref 9–23)
BUN: 10 mg/dL (ref 6–24)
Bilirubin Total: 0.3 mg/dL (ref 0.0–1.2)
CO2: 22 mmol/L (ref 20–29)
Calcium: 8.3 mg/dL — ABNORMAL LOW (ref 8.7–10.2)
Chloride: 104 mmol/L (ref 96–106)
Creatinine, Ser: 0.77 mg/dL (ref 0.57–1.00)
Globulin, Total: 3.3 g/dL (ref 1.5–4.5)
Glucose: 89 mg/dL (ref 70–99)
Potassium: 4 mmol/L (ref 3.5–5.2)
Sodium: 141 mmol/L (ref 134–144)
Total Protein: 7.2 g/dL (ref 6.0–8.5)
eGFR: 99 mL/min/{1.73_m2} (ref >59)

## 2023-07-10 LAB — THYROID PANEL WITH TSH
Free Thyroxine Index: 1.4 (ref 1.2–4.9)
T3 Uptake Ratio: 20 % — ABNORMAL LOW (ref 24–39)
T4, Total: 7 ug/dL (ref 4.5–12.0)
TSH: 0.895 u[IU]/mL (ref 0.450–4.500)

## 2023-07-10 LAB — VITAMIN D 25 HYDROXY (VIT D DEFICIENCY, FRACTURES): Vit D, 25-Hydroxy: 36.6 ng/mL (ref 30.0–100.0)

## 2023-07-10 LAB — LIPID PANEL WITH LDL/HDL RATIO
Cholesterol, Total: 99 mg/dL — ABNORMAL LOW (ref 100–199)
HDL: 37 mg/dL — ABNORMAL LOW (ref >39)
LDL Chol Calc (NIH): 47 mg/dL (ref 0–99)
LDL/HDL Ratio: 1.3 ratio (ref 0.0–3.2)
Triglycerides: 72 mg/dL (ref 0–149)
VLDL Cholesterol Cal: 15 mg/dL (ref 5–40)

## 2023-07-10 LAB — INSULIN, RANDOM: INSULIN: 5.3 u[IU]/mL (ref 2.6–24.9)

## 2023-07-10 LAB — VITAMIN B12: Vitamin B-12: 604 pg/mL (ref 232–1245)

## 2023-07-11 DIAGNOSIS — J069 Acute upper respiratory infection, unspecified: Secondary | ICD-10-CM | POA: Diagnosis not present

## 2023-07-11 DIAGNOSIS — J9601 Acute respiratory failure with hypoxia: Secondary | ICD-10-CM | POA: Diagnosis not present

## 2023-07-11 DIAGNOSIS — R6250 Unspecified lack of expected normal physiological development in childhood: Secondary | ICD-10-CM | POA: Diagnosis not present

## 2023-07-14 ENCOUNTER — Ambulatory Visit: Payer: Self-pay

## 2023-07-14 NOTE — Therapy (Signed)
 [facility] [address] [place], [place], [address] Phone: [phone]   Fax:  [phone]  Patient Details  Name: Regina Wood MRN: [id number] Date of Birth: [birth year] Referring Provider:  Anson Fret, MD   Encounter Date: 07/14/2023  SPEECH THERAPY DISCHARGE SUMMARY  Visits from Start of Care: 2  Current functional level related to goals / functional outcomes: Family requested discharge per front office staff. ST goals not met.    Remaining deficits: Developmental delay, dysarthria   Education / Equipment: Dysarthria strategies, caregiver ed   Patient agrees to discharge. Patient goals were not met. Patient is being discharged due to the patient's caregiver's request.   Gracy Racer, CCC-SLP 07/14/2023, 10:38 AM  [facility] [address] [place], [place], [address] Phone: [phone]   Fax:  [phone]

## 2023-07-22 ENCOUNTER — Ambulatory Visit (INDEPENDENT_AMBULATORY_CARE_PROVIDER_SITE_OTHER): Payer: Medicare HMO | Admitting: Family Medicine

## 2023-07-22 ENCOUNTER — Encounter (INDEPENDENT_AMBULATORY_CARE_PROVIDER_SITE_OTHER): Payer: Self-pay | Admitting: Family Medicine

## 2023-07-22 VITALS — BP 100/62 | HR 88 | Temp 97.5°F | Ht 60.0 in | Wt 255.0 lb

## 2023-07-22 DIAGNOSIS — E66813 Obesity, class 3: Secondary | ICD-10-CM | POA: Diagnosis not present

## 2023-07-22 DIAGNOSIS — E559 Vitamin D deficiency, unspecified: Secondary | ICD-10-CM | POA: Diagnosis not present

## 2023-07-22 DIAGNOSIS — J9601 Acute respiratory failure with hypoxia: Secondary | ICD-10-CM | POA: Diagnosis not present

## 2023-07-22 DIAGNOSIS — Z6841 Body Mass Index (BMI) 40.0 and over, adult: Secondary | ICD-10-CM

## 2023-07-22 DIAGNOSIS — Z7985 Long-term (current) use of injectable non-insulin antidiabetic drugs: Secondary | ICD-10-CM

## 2023-07-22 DIAGNOSIS — E1165 Type 2 diabetes mellitus with hyperglycemia: Secondary | ICD-10-CM | POA: Diagnosis not present

## 2023-07-22 NOTE — Assessment & Plan Note (Signed)
 Diagnosis likely given other medical comorbidities.  Vitamin D level today.

## 2023-07-22 NOTE — Progress Notes (Unsigned)
 SUBJECTIVE:  Chief Complaint: Obesity  Interim History: Patient here for first follow up.  Mother is present and states patient often struggles and refuses to eat foods she does not like. Mother is questioning whether or not the program is a good fit given patient's inflexibility to try new foods.  Regina Wood is here to discuss her progress with her obesity treatment plan. She is on the Category 4 Plan and states she is following her eating plan approximately 25 % of the time. She states she is not exercising.   OBJECTIVE: Visit Diagnoses: Problem List Items Addressed This Visit       Endocrine   Type 2 diabetes mellitus with hyperglycemia, without long-term current use of insulin (HCC) - Primary   Patient on Mounjaro with significant satiety.  We discussed importance of focusing on protein intake in the foods she will eat and we discussed a variety of higher protein options for the foods patient will eat.        Other   Morbid (severe) obesity due to excess calories (HCC)   Class 3 severe obesity due to excess calories with serious comorbidity and body mass index (BMI) of 50.0 to 59.9 in adult [facility])   Anthropometric Measurements Height: 5' (1.524 m) Weight: 255 lb (115.7 kg) BMI (Calculated): 49.8 Weight at Last Visit: 254 lb Weight Lost Since Last Visit: 0 Weight Gained Since Last Visit: 1 Starting Weight: 254 lb Total Weight Loss (lbs): 0 lb (0 kg) Body Composition  Body Fat %: 56.8 % Fat Mass (lbs): 145.2 lbs Muscle Mass (lbs): 105 lbs Visceral Fat Rating : 20 Other Clinical Data Today's Visit #: 2 Starting Date: 07/08/23 Comments: Cat 4        Vitamin D deficiency   Discussed importance of vitamin d supplementation.  Vitamin d supplementation has been shown to decrease fatigue, decrease risk of progression to insulin resistance and then prediabetes, decreases risk of falling in older age and can even assist in decreasing depressive symptoms in PTSD.   Patient  encouraged to continue to start OTC Vitamin d 5k.      Other Visit Diagnoses       BMI 45.0-49.9, adult (HCC)           No data recorded       07/22/2023    3:00 PM 07/08/2023    6:00 AM 06/19/2023    9:30 AM  Vitals with BMI  Height 5\' 0"  5\' 0"    Weight 255 lbs 254 lbs   BMI 49.8 49.61   Systolic 100 113 332  Diastolic 62 77 72  Pulse 88 84 84      ASSESSMENT AND PLAN:  Diet: Regina Wood is currently in the action stage of change. As such, her goal is to continue with weight loss efforts and has agreed to practicing portion control and making smarter food choices, such as increasing vegetables and decreasing simple carbohydrates.   Exercise:  For substantial health benefits, adults should do at least 150 minutes (2 hours and 30 minutes) a week of moderate-intensity, or 75 minutes (1 hour and 15 minutes) a week of vigorous-intensity aerobic physical activity, or an equivalent combination of moderate- and vigorous-intensity aerobic activity. Aerobic activity should be performed in episodes of at least 10 minutes, and preferably, it should be spread throughout the week.  Behavior Modification:  We discussed the following Behavioral Modification Strategies today: increasing lean protein intake, decreasing simple carbohydrates, increasing vegetables, meal planning and cooking strategies, avoiding temptations, and  planning for success.   Return if symptoms worsen or fail to improve.Marland Kitchen She was informed of the importance of frequent follow up visits to maximize her success with intensive lifestyle modifications for her multiple health conditions.  Attestation Statements:   Reviewed by clinician on day of visit: allergies, medications, problem list, medical history, surgical history, family history, social history, and previous encounter notes.   Time spent on visit including pre-visit chart review and post-visit care and charting was 25 minutes  Reuben Likes, MD

## 2023-07-22 NOTE — Assessment & Plan Note (Signed)
 Anthropometric Measurements Height: 5' (1.524 m) Weight: 254 lb (115.2 kg) BMI (Calculated): 49.61 Starting Weight: 254 lb Waist Measurement : 54 inches Other Clinical Data RMR: [id number] Fasting: yes Labs: yes Today's Visit #: 1 Starting Date: 07/08/23

## 2023-07-25 NOTE — Assessment & Plan Note (Signed)
 Patient on Mounjaro with significant satiety.  We discussed importance of focusing on protein intake in the foods she will eat and we discussed a variety of higher protein options for the foods patient will eat.

## 2023-07-25 NOTE — Assessment & Plan Note (Signed)
 Discussed importance of vitamin d supplementation.  Vitamin d supplementation has been shown to decrease fatigue, decrease risk of progression to insulin resistance and then prediabetes, decreases risk of falling in older age and can even assist in decreasing depressive symptoms in PTSD.   Patient encouraged to continue to start OTC Vitamin d 5k.

## 2023-07-25 NOTE — Assessment & Plan Note (Signed)
 Anthropometric Measurements Height: 5' (1.524 m) Weight: 255 lb (115.7 kg) BMI (Calculated): 49.8 Weight at Last Visit: 254 lb Weight Lost Since Last Visit: 0 Weight Gained Since Last Visit: 1 Starting Weight: 254 lb Total Weight Loss (lbs): 0 lb (0 kg) Body Composition  Body Fat %: 56.8 % Fat Mass (lbs): 145.2 lbs Muscle Mass (lbs): 105 lbs Visceral Fat Rating : 20 Other Clinical Data Today's Visit #: 2 Starting Date: 07/08/23 Comments: Cat 4

## 2023-08-07 ENCOUNTER — Ambulatory Visit (INDEPENDENT_AMBULATORY_CARE_PROVIDER_SITE_OTHER): Admitting: Family Medicine

## 2023-08-11 DIAGNOSIS — R6250 Unspecified lack of expected normal physiological development in childhood: Secondary | ICD-10-CM | POA: Diagnosis not present

## 2023-08-11 DIAGNOSIS — J069 Acute upper respiratory infection, unspecified: Secondary | ICD-10-CM | POA: Diagnosis not present

## 2023-08-11 DIAGNOSIS — J9601 Acute respiratory failure with hypoxia: Secondary | ICD-10-CM | POA: Diagnosis not present

## 2023-08-13 ENCOUNTER — Ambulatory Visit: Payer: Medicare HMO | Admitting: Neurology

## 2023-08-22 DIAGNOSIS — J9601 Acute respiratory failure with hypoxia: Secondary | ICD-10-CM | POA: Diagnosis not present

## 2023-09-01 ENCOUNTER — Other Ambulatory Visit: Payer: Self-pay | Admitting: Neurology

## 2023-09-01 DIAGNOSIS — G629 Polyneuropathy, unspecified: Secondary | ICD-10-CM

## 2023-09-02 ENCOUNTER — Encounter: Payer: Self-pay | Admitting: Neurology

## 2023-09-02 ENCOUNTER — Telehealth: Payer: Self-pay | Admitting: Neurology

## 2023-09-02 NOTE — Telephone Encounter (Signed)
 LVM informing pt of need to reschedule 09/12/23 appt due to Dr. Tresia Fruit needing extra time with another patient  If patient calls back to r/s I have held a slot for her with Dr. Tresia Fruit for 09/05/23 at 8:15am

## 2023-09-08 NOTE — Telephone Encounter (Signed)
 Last visit with Dr Tresia Fruit 04/29/23 Next visit needs reschedule, office LVM for pt on 09/02/23  Last fills:

## 2023-09-10 DIAGNOSIS — J069 Acute upper respiratory infection, unspecified: Secondary | ICD-10-CM | POA: Diagnosis not present

## 2023-09-10 DIAGNOSIS — J9601 Acute respiratory failure with hypoxia: Secondary | ICD-10-CM | POA: Diagnosis not present

## 2023-09-10 DIAGNOSIS — R6250 Unspecified lack of expected normal physiological development in childhood: Secondary | ICD-10-CM | POA: Diagnosis not present

## 2023-09-12 ENCOUNTER — Ambulatory Visit: Admitting: Neurology

## 2023-09-15 ENCOUNTER — Telehealth: Payer: Self-pay | Admitting: Neurology

## 2023-09-15 NOTE — Telephone Encounter (Signed)
 Pt's mother, Cadey Tesfay asking if there is a earlier available than August. Would like a call back to discuss if need to wait until August due to had had MRI and have not been able to discuss the results. Good time call back would be after 3 pm.

## 2023-09-15 NOTE — Telephone Encounter (Signed)
 Phone room: Please advise mother that the radiologist hasn't reviewed result nor forwarded to us  and once Regina Wood gets the results. She will review them and make her recommendations and we will be in touch. The reading doctors are behind  Thanks,  [employer]

## 2023-09-21 DIAGNOSIS — J9601 Acute respiratory failure with hypoxia: Secondary | ICD-10-CM | POA: Diagnosis not present

## 2023-09-22 DIAGNOSIS — H40033 Anatomical narrow angle, bilateral: Secondary | ICD-10-CM | POA: Diagnosis not present

## 2023-09-22 DIAGNOSIS — H4710 Unspecified papilledema: Secondary | ICD-10-CM | POA: Diagnosis not present

## 2023-09-22 DIAGNOSIS — H04123 Dry eye syndrome of bilateral lacrimal glands: Secondary | ICD-10-CM | POA: Diagnosis not present

## 2023-09-22 DIAGNOSIS — E119 Type 2 diabetes mellitus without complications: Secondary | ICD-10-CM | POA: Diagnosis not present

## 2023-09-25 ENCOUNTER — Telehealth: Payer: Self-pay | Admitting: Neurology

## 2023-09-25 ENCOUNTER — Telehealth: Payer: Self-pay | Admitting: *Deleted

## 2023-09-25 NOTE — Telephone Encounter (Signed)
 Copied from CRM [id number]. Topic: Clinical - Medication Question >> Sep 25, 2023 12:29 PM Jethro Morrison wrote: Reason for CRM: PT MOM  Art Bigness) [id number] CALLED REQUESTING OXYGEN  TO BE REMOVED OR DISCHARGED FROM HOME [employer]  ADVISED HER SHE WILL NEED THIS DONE THE FAX NUMBER IS [phone].  ATC x1.  LVM to return call.

## 2023-09-25 NOTE — Telephone Encounter (Signed)
 Pt's aid called stating that the pt was needing a f/u appt due to the pt's headaches coming back about two weeks ago. She stated that the pt will wake up and complain about the headaches and will go to bed still complaining that her head hurts. Pt has been scheduled and put on the wait list.

## 2023-10-01 NOTE — Progress Notes (Unsigned)
    Regina Wood D.[facility] [address] [place] [address] Phone: [phone]   Assessment and Plan:     There are no diagnoses linked to this encounter.  ***   Pertinent previous records reviewed include ***    Follow Up: ***     Subjective:   I, Regina Wood, am serving as a Neurosurgeon for Doctor Regina Wood  Chief Complaint: knee pain   HPI:   10/02/2023 Patient is a 43 year old female with knee pain. Patient states   Relevant Historical Information: ***  Additional pertinent review of systems negative.   Current Outpatient Medications:    acetaminophen  (TYLENOL ) 500 MG tablet, Take 1,000 mg by mouth every 6 (six) hours as needed for moderate pain., Disp: , Rfl:    acetaZOLAMIDE  (DIAMOX ) 250 MG tablet, Take 2 tablets (500 mg total) by mouth 2 (two) times daily., Disp: 120 tablet, Rfl: 0   albuterol  (VENTOLIN  HFA) 108 (90 Base) MCG/ACT inhaler, Inhale 2 puffs into the lungs every 6 (six) hours as needed for wheezing or shortness of breath., Disp: 8 g, Rfl: 2   benztropine  (COGENTIN ) 1 MG tablet, Take 1 mg by mouth at bedtime., Disp: , Rfl:    blood glucose meter kit and supplies, Dispense based on patient and insurance preference. Use up to four times daily as directed. (FOR ICD-10 E10.9, E11.9)., Disp: 1 each, Rfl: 0   dapagliflozin  propanediol (FARXIGA ) 5 MG TABS tablet, Take 5 mg by mouth daily., Disp: , Rfl:    famotidine  (PEPCID ) 20 MG tablet, One after supper, Disp: 30 tablet, Rfl: 11   losartan  (COZAAR ) 100 MG tablet, Take 100 mg by mouth daily., Disp: , Rfl:    metFORMIN  (GLUCOPHAGE -XR) 750 MG 24 hr tablet, Take 1,500 mg by mouth daily with breakfast., Disp: , Rfl:    MOUNJARO 5 MG/0.5ML Pen, Inject 5 mg into the skin once a week., Disp: , Rfl:    nebivolol (BYSTOLIC) 2.5 MG tablet, Take 2.5 mg by mouth daily., Disp: , Rfl:    ondansetron  (ZOFRAN -ODT) 8 MG disintegrating tablet, Take 1 tablet (8 mg total) by mouth  every 8 (eight) hours as needed for nausea or vomiting., Disp: 10 tablet, Rfl: 0   pantoprazole  (PROTONIX ) 40 MG tablet, TAKE 1 TABLET (40 MG TOTAL) BY MOUTH DAILY. TAKE 30-60 MIN BEFORE FIRST MEAL OF THE DAY, Disp: 90 tablet, Rfl: 1   pregabalin  (LYRICA ) 200 MG capsule, TAKE 1 CAPSULE BY MOUTH TWICE A DAY, Disp: 60 capsule, Rfl: 1   Spacer/Aero-Holding Chambers (AEROCHAMBER MV) inhaler, Use as instructed, Disp: 1 each, Rfl: 0   topiramate  (TOPAMAX ) 100 MG tablet, Take 2.5 tablets (250 mg total) by mouth at bedtime. Then can increase to 300mg  at bedtime if needed(3 tabs) (Patient taking differently: Take 100 mg by mouth at bedtime. Then can increase to 300mg  at bedtime if needed(3 tabs)), Disp: 270 tablet, Rfl: 3   Objective:     There were no vitals filed for this visit.    There is no height or weight on file to calculate BMI.    Physical Exam:    ***   Electronically signed by:  Regina Wood D.[facility] 7:44 AM 10/01/23

## 2023-10-02 ENCOUNTER — Ambulatory Visit (INDEPENDENT_AMBULATORY_CARE_PROVIDER_SITE_OTHER)

## 2023-10-02 ENCOUNTER — Ambulatory Visit (INDEPENDENT_AMBULATORY_CARE_PROVIDER_SITE_OTHER): Admitting: Sports Medicine

## 2023-10-02 VITALS — HR 104 | Ht 60.0 in | Wt 255.0 lb

## 2023-10-02 DIAGNOSIS — M25561 Pain in right knee: Secondary | ICD-10-CM

## 2023-10-02 DIAGNOSIS — M170 Bilateral primary osteoarthritis of knee: Secondary | ICD-10-CM | POA: Diagnosis not present

## 2023-10-02 DIAGNOSIS — M25562 Pain in left knee: Secondary | ICD-10-CM

## 2023-10-02 DIAGNOSIS — M1712 Unilateral primary osteoarthritis, left knee: Secondary | ICD-10-CM

## 2023-10-02 DIAGNOSIS — G8929 Other chronic pain: Secondary | ICD-10-CM

## 2023-10-02 MED ORDER — MELOXICAM 15 MG PO TABS: 15.0000 mg | ORAL_TABLET | Freq: Every day | ORAL | 0 refills | Status: AC

## 2023-10-02 NOTE — Patient Instructions (Signed)
-   Start meloxicam 15 mg daily x2 weeks.  If still having pain after 2 weeks, complete 3rd-week of NSAID. May use remaining NSAID as needed once daily for pain control.  Do not to use additional over-the-counter NSAIDs (ibuprofen , naproxen, Advil , Aleve, etc.) while taking prescription NSAIDs.  May use Tylenol  [phone] mg 2 to 3 times a day for breakthrough pain. Can get a knee compression sleeve at pharmacy  5 week follow up  Knee HEP

## 2023-10-09 DIAGNOSIS — F72 Severe intellectual disabilities: Secondary | ICD-10-CM | POA: Diagnosis not present

## 2023-10-09 DIAGNOSIS — F849 Pervasive developmental disorder, unspecified: Secondary | ICD-10-CM | POA: Diagnosis not present

## 2023-10-09 DIAGNOSIS — F411 Generalized anxiety disorder: Secondary | ICD-10-CM | POA: Diagnosis not present

## 2023-10-10 ENCOUNTER — Ambulatory Visit: Payer: Self-pay | Admitting: Sports Medicine

## 2023-10-13 ENCOUNTER — Telehealth: Payer: Self-pay | Admitting: Neurology

## 2023-10-13 NOTE — Telephone Encounter (Signed)
 I have reached out to Dr Tresia Fruit. Will update as soon as I hear back.

## 2023-10-13 NOTE — Telephone Encounter (Signed)
Pt called to get MRI result.

## 2023-10-14 NOTE — Telephone Encounter (Signed)
 Spoke with Dr Tresia Fruit then spoke with pt's mother. Offered appt with Dr Tresia Fruit for tomorrow 6/11 at 8 am but mother stated that was too early for patient. Was able to schedule pt for next Wed 6/18 at 1:00 pm arrive 12:45. Pt's mother was appreciative.

## 2023-10-15 NOTE — Telephone Encounter (Signed)
 Thank you, I will review MRV results at appointment

## 2023-10-15 NOTE — Telephone Encounter (Signed)
 Spoke with mother again and offered cancellation with Dr Tresia Fruit tomorrow 6/12 at 2:30 pm check-in at 2:15 pm. She was appreciative. I canceled the 6/18 appt.

## 2023-10-15 NOTE — Telephone Encounter (Signed)
 Noted

## 2023-10-16 ENCOUNTER — Ambulatory Visit (INDEPENDENT_AMBULATORY_CARE_PROVIDER_SITE_OTHER): Admitting: Neurology

## 2023-10-16 ENCOUNTER — Encounter: Payer: Self-pay | Admitting: Neurology

## 2023-10-16 VITALS — BP 130/79 | HR 93 | Ht 59.0 in | Wt 258.0 lb

## 2023-10-16 DIAGNOSIS — G08 Intracranial and intraspinal phlebitis and thrombophlebitis: Secondary | ICD-10-CM

## 2023-10-16 DIAGNOSIS — G932 Benign intracranial hypertension: Secondary | ICD-10-CM | POA: Diagnosis not present

## 2023-10-16 NOTE — Progress Notes (Signed)
 [facility]    Provider:  Dr Tresia Fruit Requesting Provider: Azalia Leo, MD Primary Care Provider:  Azalia Leo, MD    CC:  papilledema, IDIOPATHIC INTRACRANIAL HYPERTENSION   10/16/2023: CTV in 03/2023 showed possible filling defects in the transverse sinuses and recommended MRV which showed chronic nonocclusive thrombus and areas of stenosis. Patient may be a candidate for venous stenting however patient will not tolerate, we have discussed surgical options in the past with extreme anxiety from patient. At this time manage medically.  She sometimes misses her medications, mom says she will miss a few nights a week due to behavior problems. She has been doing much better the last few weeks and the headaches are better since bing on the medication. Last saw Dr. Candi Chafe and the papilledema is improved, about a month ago. She complains more in the mornings with headaches. Tried to check for sleep apnea, did not work. She sleeps with the TV and radio on. She will not wear a cpap.     IMPRESSION: 1. Multifocal abnormal tissue along the walls of the bilateral transverse sinuses and proximal left sigmoid sinus concerning for chronic nonocclusive thrombus. 2. Additional foci of nonocclusive thrombus versus septations within the mid right transverse sinus and in the distal left transverse sinus/proximal left sigmoid sinus. 3. Most prominent focus of abnormal tissue within the mid right transverse sinus associated with moderate stenosis. 4. Severe stenosis of the mid and distal left transverse sinus.    To test for venous clotting disease, labs should include a D-dimer test, which checks for elevated levels of a protein released when clots break down. Other tests might include antithrombin III, Factor V Leiden mutation analysis, protein C, and protein S levels, as well as tests for anticardiolipin antibodies and beta-2 glycoprotein 1 antibodies.  Detailed  Explanation:  05/04/2023: She did not take her medicine, last week. Dr. Candi Chafe, ophthalmologist. Her symptoms worsened when she did not take her medication. She saw Dr. Candi Chafe. Parents here and report most information. They saw she has difficulty with speech, talks all the time, interrupts and they would like therapy. No vision changes.   Patient complains of symptoms per HPI as well as the following symptoms: obesity . Pertinent negatives and positives per HPI. All others negative  03/12/2023: She had an LP it was OP 37. She felt much better only complained of headaches twice since the LP. Taking acetazolamide  in the morning 9could not tolerate more) and now on topiramate  200mg  at bedtime and doing better. She missed her meds and the headaches worsend also the setting of couugh and cold. Whn taking medications feels better. She is on her meds and still getting headaches. Prior to the illness was doing well. She tried to take a sleep test but didn't slepe. Up at night up and down.   02/22/2023: Patient with papilledema dxed with IDIOPATHIC INTRACRANIAL HYPERTENSION here for urgent referral from Dr. Candi Chafe, reviewed his notes she has papilledema, prior imaging and opening pressure consistent with IDIOPATHIC INTRACRANIAL HYPERTENSION. Here owth parents who are concerned, she has vision loss, She cannot tolerate acetazolamide . The medication makes her sick. She was prescribed 500mg  twice a day but she can only tolerate 250mg  a day. She is cognitively impaired, she gets very upset at the mention of surgery, discussed risks of permanent blindness, other options such as neurosurgery, optic nerve fenestration, LPs, other medication and significant risk of blindness. Had an extended conversation about iih and preovided reading materials I ma very  concerned but surgery is not an immediate options after discussions with family due to patient's wishes.   Reviewed notes, labs and imaging from outside physicians, which  showed:   MRI brain w/wo: 06/05/2022:  IMPRESSION: 1. Evaluation is limited by motion artifact. Within this limitation, no acute intracranial process or acute orbital abnormality. 2. Partial empty sella, which is nonspecific but can be seen in the setting of idiopathic intracranial hypertension. Evaluation for additional findings related to intracranial hypertension is limited by motion artifact. Correlate with symptoms and consider lumbar puncture with opening pressure if clinically indicated. Patient complains of symptoms per HPI as well as the following symptoms: vision loss, cognitive impaitrment . Pertinent negatives and positives per HPI. All others negative  06/06/2022: LP CLINICAL DATA:  43 year female with papilledema, intracranial hypertension suspected. Request for image-guided lumbar puncture.   EXAM: LUMBAR PUNCTURE UNDER FLUOROSCOPY   PROCEDURE: An appropriate skin entry site was determined fluoroscopically. Operator donned sterile gloves and mask. Skin site was marked, then prepped with Betadine, draped in usual sterile fashion, and infiltrated locally with 1% lidocaine . A 20 gauge spinal needle advanced into the thecal sac at L4-L5 from a left interlaminar approach.   Clear colorless CSF spontaneously returned, with opening pressure of 24.5 cm water. 15 ml CSF were collected and divided among 4 sterile vials for the requested laboratory studies.   Closing pressure 15.5 cm of water.   The needle was then removed. The patient tolerated the procedure well without signs of immediate postprocedure complication.   FLUOROSCOPY: Radiation Exposure Index (as provided by the fluoroscopic device): 41.5 mGy Kerma   IMPRESSION: Technically successful lumbar puncture under fluoroscopy.   This exam was performed by Quintin Buckle PA-C, and was supervised and interpreted by Zara Heymann, MD.    HPI:  Regina Wood is a 43 y.o. female here as requested by Azalia Leo, MD for tingling in hands, feet and legs. Reviewed Dr. Mikeal Alder notes: failed gabapentin and lyrica , has developmental delay, diabetes, diabetic nephropathy. Had emg/ncs?  I reviewed Veatrice Georgis notes, complaining of foot leg and hands hurting and wondering if she should be seen, has not gotten any better with medication changes, she is crying a lot, Also reviewed orthopedics notes Dr. Huntley Mai March 2023 who presented with her mother, diagnosed with intellectual disability, complaints of bilateral hand pain and numbness and tingling, right worse than left, at that time approximately ongoing 2 months, no injuries, she was diagnosed by Dr. Huntley Mai with bilateral carpal tunnel syndrome and they chose to try splints from the options they discussed.  Hayes Lipps states that a CTS test was negative however I do not see that information or any EMG nerve conduction study.  The patient is intellectually disabled and unable to provide lots of details, she complains of low back pain, elbow and knee pain, also has hypertension and has a psychiatrist, she is morbidly obese.  Patient states she has pain in the heel and whole foot like they tingle and are asleep, numbness. Mostly at night in the feet. She can go to sleep but they hurt in the morning before getting out of bed, she complains during the day as well, a few months. She was told to supplement b12 so likely B12 deficiency and diabetes. I don't have a b12 value but they will see Dr. Clotilda Danish this week and can also ask about thyroid .   Hands: in the thumb and 1st 3 fingers . Saw Dr. Huntley Mai, diagnosed with CTS,  and the splints didn't fit.  First 3 fingers. We discussed Dr. Vernetta Gong evaluation and that she was diagnosed with CTS. We reviewed CTS. Mother had CTS and release. She has not been wearing the wrist splints at night. Discussed emg/ncs. Also offered OT but mother would like to make sure it is CTS will send back to Dr. Huntley Mai for emg/ncs which he recommended  anyway.  Reviewed notes, labs and imaging from outside physicians, which showed:  Labs collected include B12, BMP with elevated glucose otherwise normal with BUN 70 creatinine 0.7, A1c 6.9, I do not see B12 value in the notes.  Review of Systems: Patient complains of symptoms per HPI as well as the following symptoms pain. Pertinent negatives and positives per HPI. All others negative.   Social History   Socioeconomic History   Marital status: Single    Spouse name: Not on file   Number of children: Not on file   Years of education: Not on file   Highest education level: Not on file  Occupational History   Not on file  Tobacco Use   Smoking status: Never   Smokeless tobacco: Never  Vaping Use   Vaping status: Never Used  Substance and Sexual Activity   Alcohol  use: Yes    Comment: beer occ   Drug use: Never   Sexual activity: Not on file  Other Topics Concern   Not on file  Social History Narrative   Caffeine : in hot chocolate   Left handed   Lives at home with parents   Social Drivers of Health   Financial Resource Strain: Not on file  Food Insecurity: Not on file  Transportation Needs: Not on file  Physical Activity: Not on file  Stress: Not on file  Social Connections: Not on file  Intimate Partner Violence: Not on file    Family History  Problem Relation Age of Onset   Hyperlipidemia Mother    Hypertension Mother    Diabetes Mother    Healthy Mother    Migraines Mother    Depression Mother    Anxiety disorder Mother    Obesity Mother    Obesity Father    Cancer Father    Hypertension Father    Diabetes Father    Neuropathy Neg Hx    Pseudotumor cerebri Neg Hx     Past Medical History:  Diagnosis Date   Back pain    Bilateral swelling of feet    CTS (carpal tunnel syndrome)    Diabetes mellitus without complication (HCC)    High blood pressure    High cholesterol    Idiopathic intracranial hypertension    Obesity    Pneumonia    SOB  (shortness of breath)     Patient Active Problem List   Diagnosis Date Noted   Transverse sinus thrombosis 10/17/2023   Vitamin D  deficiency 07/22/2023   Hyperlipidemia associated with type 2 diabetes mellitus (HCC) 07/08/2023   Class 3 severe obesity due to excess calories with serious comorbidity and body mass index (BMI) of 50.0 to 59.9 in adult 03/20/2023   Type 2 diabetes mellitus with hyperglycemia, without long-term current use of insulin  (HCC) 03/20/2023   Asthma 03/13/2023   IIH (idiopathic intracranial hypertension) 02/22/2023   Papilledema 02/22/2023   Severe headache 02/22/2023   Vision loss 02/22/2023   Diabetic renal disease (HCC) 01/29/2023   Skin sensation disturbance 01/29/2023   Suspected sleep apnea 04/19/2022   Chronic respiratory failure (HCC) 03/07/2022   Abnormal CT of  the chest 12/31/2019   Intellectual disability 07/07/2019   Morbid (severe) obesity due to excess calories (HCC) 07/07/2019   Hyperglycemia 04/21/2019   Hypertension associated with diabetes (HCC) 04/21/2019   Developmental delay 04/21/2019    Past Surgical History:  Procedure Laterality Date   NO PAST SURGERIES     RADIOLOGY WITH ANESTHESIA N/A 06/19/2023   Procedure: MRI/MRV HEAD WITH AND WITOUT CONTRAST WITH ANESTHESIA;  Surgeon: Radiologist, Medication, MD;  Location: [facility] OR;  Service: Radiology;  Laterality: N/A;    Current Outpatient Medications  Medication Sig Dispense Refill   acetaminophen  (TYLENOL ) 500 MG tablet Take 1,000 mg by mouth every 6 (six) hours as needed for moderate pain.     acetaZOLAMIDE  (DIAMOX ) 250 MG tablet Take 2 tablets (500 mg total) by mouth 2 (two) times daily. 120 tablet 0   albuterol  (VENTOLIN  HFA) 108 (90 Base) MCG/ACT inhaler Inhale 2 puffs into the lungs every 6 (six) hours as needed for wheezing or shortness of breath. 8 g 2   benztropine  (COGENTIN ) 1 MG tablet Take 1 mg by mouth at bedtime.     blood glucose meter kit and supplies Dispense based on  patient and insurance preference. Use up to four times daily as directed. (FOR ICD-10 E10.9, E11.9). 1 each 0   dapagliflozin  propanediol (FARXIGA ) 5 MG TABS tablet Take 5 mg by mouth daily.     famotidine  (PEPCID ) 20 MG tablet One after supper 30 tablet 11   losartan  (COZAAR ) 100 MG tablet Take 100 mg by mouth daily.     meloxicam  (MOBIC ) 15 MG tablet Take 1 tablet (15 mg total) by mouth daily. 30 tablet 0   metFORMIN  (GLUCOPHAGE -XR) 750 MG 24 hr tablet Take 1,500 mg by mouth daily with breakfast.     MOUNJARO 5 MG/0.5ML Pen Inject 5 mg into the skin once a week.     nebivolol (BYSTOLIC) 2.5 MG tablet Take 2.5 mg by mouth daily.     ondansetron  (ZOFRAN -ODT) 8 MG disintegrating tablet Take 1 tablet (8 mg total) by mouth every 8 (eight) hours as needed for nausea or vomiting. 10 tablet 0   pantoprazole  (PROTONIX ) 40 MG tablet TAKE 1 TABLET (40 MG TOTAL) BY MOUTH DAILY. TAKE 30-60 MIN BEFORE FIRST MEAL OF THE DAY 90 tablet 1   pregabalin  (LYRICA ) 200 MG capsule TAKE 1 CAPSULE BY MOUTH TWICE A DAY 60 capsule 1   Spacer/Aero-Holding Chambers (AEROCHAMBER MV) inhaler Use as instructed 1 each 0   topiramate  (TOPAMAX ) 100 MG tablet Take 2.5 tablets (250 mg total) by mouth at bedtime. Then can increase to 300mg  at bedtime if needed(3 tabs) (Patient taking differently: Take 100 mg by mouth at bedtime. Then can increase to 300mg  at bedtime if needed(3 tabs)) 270 tablet 3   No current facility-administered medications for this visit.    Allergies as of 10/16/2023   (No Known Allergies)    Vitals: BP 130/79   Pulse 93   Ht 4' 11 (1.499 m)   Wt 258 lb (117 kg)   LMP 09/17/2023 (Approximate)   BMI 52.11 kg/m  Last Weight:  Wt Readings from Last 1 Encounters:  10/16/23 258 lb (117 kg)   Last Height:   Ht Readings from Last 1 Encounters:  10/16/23 4' 11 (1.499 m)   Physical exam: Exam: Gen: NAD, conversant      CV: No palpitations or chest pain or SOB. VS: Breathing at a normal rate.  obese. Not febrile. Eyes: Conjunctivae clear without exudates or hemorrhage  Neuro: Detailed Neurologic Exam  Speech:    Speech is normal Cognition: Intellectual disbility Cranial Nerves:    The pupils are equal, round, and reactive to light. Visual fields are full. Blurring of the margins on fundoscopic exam, stable from last appointment. Extraocular movements are intact.  The face is symmetric with normal sensation. The palate elevates in the midline. Hearing intact. Voice is normal. Shoulder shrug is normal. The tongue has normal motion without fasciculations.   Coordination: normal  Motor Observation:   no involuntary movements noted. Tone:    Appears normal  Posture:    Posture is normal. normal erect    Strength:    Strength is anti-gravity and symmetric in the upper and lower limbs.      Sensation: intact to LT, no reports of numbness or tingling or paresthesias       Assessment/Plan:  Papilledema and IDIOPATHIC INTRACRANIAL HYPERTENSION. Patient with papilledema dxed with IDIOPATHIC INTRACRANIAL HYPERTENSION here for urgent referral from Dr. Candi Chafe, reviewed his notes she has papilledema, prior imaging and opening pressure consistent with IDIOPATHIC INTRACRANIAL HYPERTENSION. Here owth parents who are concerned, she has vision loss, She cannot tolerate acetazolamide . The medication makes her sick. She was prescribed 500mg  twice a day but she can only tolerate 250mg  a day. She is cognitively impaired, she gets very upset at the mention of surgery, discussed risks of permanent blindness, other options such as neurosurgery, optic nerve fenestration, LPs, other medication and significant risk of blindness. Had an extended conversation about iih and preovided reading materials I ma very concerned but surgery is not an immediate options after discussions with family due to patient's wishes.  - Tried sleep eval, could not tolerate, will not wear cpap - MRV with chronic venous sinus  thrombi and stenosis, patient would not tolerate stenting or evaluation, medically manage, will order clotting labs - continue topiramate  and diamox , she is doing better, headaches increae when she misses medications - Continue mounjaro and increase as tolerated for weight loss - Consider another lumbar puncture if needed   Orders Placed This Encounter  Procedures   Antithrombin III   Factor V Leiden (PCR w/Gel electrophoresis)   Protein C activity   Protein C, total   Protein S activity   Protein S, total   Cardiolipin antibodies, IgG, IgM, IgA   Beta-2-glycoprotein i abs, IgG/M/A   No orders of the defined types were placed in this encounter.    IF she gets worse she will call, we will order another LP for fluid removal and call Dr. Candi Chafe for re-examination asap, please inform Dr. Tresia Fruit asap should she call.   - LP with opening pressure 33, removed 37ml of fluid, patient felt much better, tolerated procedure very well and in fact had a great time per parents (who are here and provide much information) with Dr. Vanda Gemma (much appreciate my colleague) - She did not tolerate Diamox  BID but appears to be doing better on Diamox  in the morning and Topiramate  200mg  at bedtime. I had limited view of her fundi but appear papilledema is improved, she will see my wonderful colleague Dr. Candi Chafe in early December and will await his examination for verification - we had a conversation about IDIOPATHIC INTRACRANIAL HYPERTENSION again today, I think the family felt better about everything, said Regina Wood is not complaining of headaches often anymore, patient is lovely, I answered all question, we will see them back later in December after exam with ophthalmology - Possible Transverse sinus thrombosis, saw filling  defect CTV, follow up MRV   Scheduled for sedated MRV 06/2022: Need a full physical exam at follow up appointment and to forward to team see email, for the sedation.   CTV showed:  IMPRESSION: 1. Apparent nonocclusive filling defects in the lateral aspects of both transverse sinuses are favored to reflect artifact or prominent arachnoid granulations; however, nonocclusive thrombus can not be excluded. Recommend MRV with and without contrast for further evaluation. 2. Unchanged partially empty sell  Referred to the [facility] wellness [facility]   Weight loss is critical, discussed weight loss and risk of permanent vision loss   For any acute change especially worsening headache or vision loss call 911 and proceed to ED  To prevent or relieve headaches, try the following: Cool Compress. Lie down and place a cool compress on your head.  Avoid headache triggers. If certain foods or odors seem to have triggered your migraines in the past, avoid them. A headache diary might help you identify triggers.  Include physical activity in your daily routine. Try a daily walk or other moderate aerobic exercise.  Manage stress. Find healthy ways to cope with the stressors, such as delegating tasks on your to-do list.  Practice relaxation techniques. Try deep breathing, yoga, massage and visualization.  Eat regularly. Eating regularly scheduled meals and maintaining a healthy diet might help prevent headaches. Also, drink plenty of fluids.  Follow a regular sleep schedule. Sleep deprivation might contribute to headaches Consider biofeedback. With this mind-body technique, you learn to control certain bodily functions -- such as muscle tension, heart rate and blood pressure -- to prevent headaches or reduce headache pain.    Proceed to emergency room if you experience new or worsening symptoms or symptoms do not resolve, if you have new neurologic symptoms or if headache is severe, or for any concerning symptom.   Provided education and documentation from American headache Society toolbox including articles on: pseudotumoer cerebri(IIH), chronic migraine medication overuse headache,  chronic migraines, prevention of migraines and other headaches, behavioral and other nonpharmacologic treatments for headache.   PRIOR A/P 11/2021:  Hands:  +Mcphalen's and +Tinels at the wrists. Has Seen Ortho dxed with CTS: Per Dr. Huntley Mai copied from his notes Plan: I discussed with Harry Lindau and her mother the nature of carpal tunnel syndrome. We discussed treatment options including acceptance, day and/or nighttime splinting, injection of the carpal tunnel, and carpal tunnel release. We discussed potential further evaluation with nerve conduction studies. At this time she and her mother would like to try wrist splints and see if this provides some relief. I think this is reasonable. She was provided splints for use as needed. I will see her back on a as needed basis. If they note any worsening of symptoms or failure to improve I will be happy to see her back. They agree with the plan of care.   - My recommendation for the hands would be to return to Dr. Huntley Mai and get EMG/NCS and pursue treatment. I will send referral back to his office for emg/ncs and further treatment.  Feet: Patient states she has pain in the heel and whole foot like they tingle and are asleep, numbness. Mostly at night in the feet. She can go to sleep but they hurt in the morning before getting out of bed and all day. She was told to supplement b12 so maybe B12 deficiency?(I can't find the B12 value in the consult notes). and diabetes. I don't have a b12 value but  they will see Dr. Clotilda Danish this week and can also ask about thyroid  as well and make sure that has been completed at some point. I will test for a few more causes of neuropathy (by exam appears to be small-fiber). Increased Lyrica . Dr. Clotilda Danish can further increase Lyrica  as needed.  -May consider daily alpha lipoic acid which is an antioxidant that may reduce free radical oxidative stress associated with diabetic polyneuropathy, existing evidence suggests that alpha  lipoic acid significantly reduces stabbing, lancinating and burning pain and diabetic neuropathy with its onset of action as early as 1-2 weeks. 400-600mg /day  - topical lidocaine  or capsaicin on the feet  Cc: Azalia Leo, MD,  Charolet Cope Chales [place], MD  Aldona Amel, MD  [facility] [address] [place], [place] [address]  Phone [phone] Fax [phone] I spent over 30 minutes of face-to-face and non-face-to-face time with patient on the  1. IIH (idiopathic intracranial hypertension)   2. Transverse sinus thrombosis      diagnosis.  This included previsit chart review, lab review, study review, order entry, electronic health record documentation, patient education on the different diagnostic and therapeutic options, counseling and coordination of care, risks and benefits of management, compliance, or risk factor reduction

## 2023-10-17 DIAGNOSIS — G08 Intracranial and intraspinal phlebitis and thrombophlebitis: Secondary | ICD-10-CM | POA: Insufficient documentation

## 2023-10-21 ENCOUNTER — Ambulatory Visit: Payer: Self-pay | Admitting: Neurology

## 2023-10-21 NOTE — Telephone Encounter (Signed)
 Spoke to mom (checked DPR ) Gave Patients labwork results informed mom per Dr Tresia Fruit still awaiting one lab and I will contact them if this is abnormal. But no suggestion of a clotting disorder which is great news  Mom expressed understanding and thanked me for calling

## 2023-10-21 NOTE — Telephone Encounter (Signed)
-----   Message from Glory Larsen sent at 10/21/2023  9:56 AM EDT ----- So far blood work is normal, still awaiting one lab and I will contact them if this is abnormal. But no suggestion of a clotting disorder which is great news, thank you ----- Message ----- From: Interface, [facility] Lab Results In Sent: 10/17/2023   4:36 PM EDT To: Glory Larsen, MD

## 2023-10-22 ENCOUNTER — Ambulatory Visit: Admitting: Neurology

## 2023-10-22 NOTE — Telephone Encounter (Signed)
 Called and spoke with patient's mother, Art [facility]), she advised that the oxygen  had already been picked up.  Nothing further needed.

## 2023-10-28 LAB — BETA-2-GLYCOPROTEIN I ABS, IGG/M/A
Beta-2 Glyco 1 IgA: <9 GPI IgA units (ref 0–25)
Beta-2 Glyco 1 IgM: <9 GPI IgM units (ref 0–32)
Beta-2 Glyco I IgG: <9 GPI IgG units (ref 0–20)

## 2023-10-28 LAB — FACTOR V LEIDEN (PCR W/GEL ELECTROPHORESIS)

## 2023-10-28 LAB — PROTEIN S, TOTAL: Protein S Ag, Total: 77 % (ref 60–150)

## 2023-10-28 LAB — PROTEIN C ACTIVITY: Protein C Activity: 76 % (ref 73–180)

## 2023-10-28 LAB — ANTITHROMBIN III: AntiThromb III Func: 127 % (ref 75–135)

## 2023-10-28 LAB — PROTEIN C, TOTAL: Protein C Antigen: 72 % (ref 60–150)

## 2023-10-28 NOTE — Progress Notes (Deleted)
 Ben Jackson [facility] [address] [place] [address] Phone: [phone]   Assessment and Plan:     There are no diagnoses linked to this encounter.  ***   Pertinent previous records reviewed include ***    Follow Up: ***     Subjective:   I, Jacolby Risby, am serving as a Neurosurgeon for Doctor Morene Mace   Chief Complaint: knee pain    HPI:    10/02/2023 Patient is a 43 year old female with knee pain. Patient states bilateral knee pain left is worse than right. A month of pain. Antalgic gait. Numbness and tingling hx of diabetes. Patellar tendon pain. Decreased ROM pain when moving feels a catching sensation    11/06/2023 Patient states   Relevant Historical Information: DM type II, hypertension, developmental delay  Additional pertinent review of systems negative.   Current Outpatient Medications:    acetaminophen  (TYLENOL ) 500 MG tablet, Take 1,000 mg by mouth every 6 (six) hours as needed for moderate pain., Disp: , Rfl:    acetaZOLAMIDE  (DIAMOX ) 250 MG tablet, Take 2 tablets (500 mg total) by mouth 2 (two) times daily., Disp: 120 tablet, Rfl: 0   albuterol  (VENTOLIN  HFA) 108 (90 Base) MCG/ACT inhaler, Inhale 2 puffs into the lungs every 6 (six) hours as needed for wheezing or shortness of breath., Disp: 8 g, Rfl: 2   benztropine  (COGENTIN ) 1 MG tablet, Take 1 mg by mouth at bedtime., Disp: , Rfl:    blood glucose meter kit and supplies, Dispense based on patient and insurance preference. Use up to four times daily as directed. (FOR ICD-10 E10.9, E11.9)., Disp: 1 each, Rfl: 0   dapagliflozin  propanediol (FARXIGA ) 5 MG TABS tablet, Take 5 mg by mouth daily., Disp: , Rfl:    famotidine  (PEPCID ) 20 MG tablet, One after supper, Disp: 30 tablet, Rfl: 11   losartan  (COZAAR ) 100 MG tablet, Take 100 mg by mouth daily., Disp: , Rfl:    meloxicam  (MOBIC ) 15 MG tablet, Take 1 tablet (15 mg total) by mouth daily., Disp: 30 tablet,  Rfl: 0   metFORMIN  (GLUCOPHAGE -XR) 750 MG 24 hr tablet, Take 1,500 mg by mouth daily with breakfast., Disp: , Rfl:    MOUNJARO 5 MG/0.5ML Pen, Inject 5 mg into the skin once a week., Disp: , Rfl:    nebivolol (BYSTOLIC) 2.5 MG tablet, Take 2.5 mg by mouth daily., Disp: , Rfl:    ondansetron  (ZOFRAN -ODT) 8 MG disintegrating tablet, Take 1 tablet (8 mg total) by mouth every 8 (eight) hours as needed for nausea or vomiting., Disp: 10 tablet, Rfl: 0   pantoprazole  (PROTONIX ) 40 MG tablet, TAKE 1 TABLET (40 MG TOTAL) BY MOUTH DAILY. TAKE 30-60 MIN BEFORE FIRST MEAL OF THE DAY, Disp: 90 tablet, Rfl: 1   pregabalin  (LYRICA ) 200 MG capsule, TAKE 1 CAPSULE BY MOUTH TWICE A DAY, Disp: 60 capsule, Rfl: 1   Spacer/Aero-Holding Chambers (AEROCHAMBER MV) inhaler, Use as instructed, Disp: 1 each, Rfl: 0   topiramate  (TOPAMAX ) 100 MG tablet, Take 2.5 tablets (250 mg total) by mouth at bedtime. Then can increase to 300mg  at bedtime if needed(3 tabs) (Patient taking differently: Take 100 mg by mouth at bedtime. Then can increase to 300mg  at bedtime if needed(3 tabs)), Disp: 270 tablet, Rfl: 3   Objective:     There were no vitals filed for this visit.    There is no height or weight on file to calculate BMI.  Physical Exam:    ***   Electronically signed by:  Odis Mace [facility] 8:48 AM 10/28/23

## 2023-11-02 ENCOUNTER — Other Ambulatory Visit: Payer: Self-pay | Admitting: Sports Medicine

## 2023-11-06 ENCOUNTER — Ambulatory Visit: Admitting: Sports Medicine

## 2023-11-17 ENCOUNTER — Other Ambulatory Visit: Payer: Self-pay | Admitting: Neurology

## 2023-11-17 DIAGNOSIS — G629 Polyneuropathy, unspecified: Secondary | ICD-10-CM

## 2023-11-17 NOTE — Progress Notes (Unsigned)
 Ben Jackson [facility] [address] [place] [address] Phone: [phone]   Assessment and Plan:     There are no diagnoses linked to this encounter.  ***   Pertinent previous records reviewed include ***    Follow Up: ***     Subjective:   I, Regina Wood, am serving as a Neurosurgeon for Doctor Morene Mace   Chief Complaint: knee pain    HPI:    10/02/2023 Patient is a 43 year old female with knee pain. Patient states bilateral knee pain left is worse than right. A month of pain. Antalgic gait. Numbness and tingling hx of diabetes. Patellar tendon pain. Decreased ROM pain when moving feels a catching sensation    11/18/2023 Patient states   Relevant Historical Information: DM type II, hypertension, developmental delay  Additional pertinent review of systems negative.   Current Outpatient Medications:    acetaminophen  (TYLENOL ) 500 MG tablet, Take 1,000 mg by mouth every 6 (six) hours as needed for moderate pain., Disp: , Rfl:    acetaZOLAMIDE  (DIAMOX ) 250 MG tablet, Take 2 tablets (500 mg total) by mouth 2 (two) times daily., Disp: 120 tablet, Rfl: 0   albuterol  (VENTOLIN  HFA) 108 (90 Base) MCG/ACT inhaler, Inhale 2 puffs into the lungs every 6 (six) hours as needed for wheezing or shortness of breath., Disp: 8 g, Rfl: 2   benztropine  (COGENTIN ) 1 MG tablet, Take 1 mg by mouth at bedtime., Disp: , Rfl:    blood glucose meter kit and supplies, Dispense based on patient and insurance preference. Use up to four times daily as directed. (FOR ICD-10 E10.9, E11.9)., Disp: 1 each, Rfl: 0   dapagliflozin  propanediol (FARXIGA ) 5 MG TABS tablet, Take 5 mg by mouth daily., Disp: , Rfl:    famotidine  (PEPCID ) 20 MG tablet, One after supper, Disp: 30 tablet, Rfl: 11   losartan  (COZAAR ) 100 MG tablet, Take 100 mg by mouth daily., Disp: , Rfl:    meloxicam  (MOBIC ) 15 MG tablet, Take 1 tablet (15 mg total) by mouth daily., Disp: 30 tablet,  Rfl: 0   metFORMIN  (GLUCOPHAGE -XR) 750 MG 24 hr tablet, Take 1,500 mg by mouth daily with breakfast., Disp: , Rfl:    MOUNJARO 5 MG/0.5ML Pen, Inject 5 mg into the skin once a week., Disp: , Rfl:    nebivolol (BYSTOLIC) 2.5 MG tablet, Take 2.5 mg by mouth daily., Disp: , Rfl:    ondansetron  (ZOFRAN -ODT) 8 MG disintegrating tablet, Take 1 tablet (8 mg total) by mouth every 8 (eight) hours as needed for nausea or vomiting., Disp: 10 tablet, Rfl: 0   pantoprazole  (PROTONIX ) 40 MG tablet, TAKE 1 TABLET (40 MG TOTAL) BY MOUTH DAILY. TAKE 30-60 MIN BEFORE FIRST MEAL OF THE DAY, Disp: 90 tablet, Rfl: 1   pregabalin  (LYRICA ) 200 MG capsule, TAKE 1 CAPSULE BY MOUTH TWICE A DAY, Disp: 60 capsule, Rfl: 1   Spacer/Aero-Holding Chambers (AEROCHAMBER MV) inhaler, Use as instructed, Disp: 1 each, Rfl: 0   topiramate  (TOPAMAX ) 100 MG tablet, Take 2.5 tablets (250 mg total) by mouth at bedtime. Then can increase to 300mg  at bedtime if needed(3 tabs) (Patient taking differently: Take 100 mg by mouth at bedtime. Then can increase to 300mg  at bedtime if needed(3 tabs)), Disp: 270 tablet, Rfl: 3   Objective:     There were no vitals filed for this visit.    There is no height or weight on file to calculate BMI.  Physical Exam:    ***   Electronically signed by:  Odis Mace [facility] 12:45 PM 11/17/23

## 2023-11-18 ENCOUNTER — Telehealth: Payer: Self-pay | Admitting: Neurology

## 2023-11-18 ENCOUNTER — Ambulatory Visit (INDEPENDENT_AMBULATORY_CARE_PROVIDER_SITE_OTHER): Admitting: Sports Medicine

## 2023-11-18 VITALS — HR 75 | Ht 59.0 in | Wt 258.0 lb

## 2023-11-18 DIAGNOSIS — M1712 Unilateral primary osteoarthritis, left knee: Secondary | ICD-10-CM

## 2023-11-18 DIAGNOSIS — G8929 Other chronic pain: Secondary | ICD-10-CM | POA: Diagnosis not present

## 2023-11-18 DIAGNOSIS — M25561 Pain in right knee: Secondary | ICD-10-CM | POA: Diagnosis not present

## 2023-11-18 DIAGNOSIS — M545 Low back pain, unspecified: Secondary | ICD-10-CM

## 2023-11-18 DIAGNOSIS — M25562 Pain in left knee: Secondary | ICD-10-CM | POA: Diagnosis not present

## 2023-11-18 NOTE — Patient Instructions (Signed)
 Tylenol  [phone] mg 2-3 times a day for pain relief  Voltaren gel over areas of pain  PT referral  8 week follow up

## 2023-11-18 NOTE — Telephone Encounter (Signed)
 I called mother of pt.  Pt had been taking her diamox  500mg  po bid and topamax  300mg  at bedtime for IIH.  She takes tylenol  1000mg  po for headaches and this seems to help some.  She has no vision problems.  She has increased frequency of headaches/dizziness better some with tylenol .  Next appt with Dr. Octavia in 12-2023.  I told her that Dr. Ines is out of office this week.  If she seems to get worse of course seek eval at urgent care or pcp.  Mother concerned that IIH worse.

## 2023-11-18 NOTE — Telephone Encounter (Signed)
 Patient's mother wanted to let Dr. Ines know experience frequency headaches and dizziness. Taking extra strength Tylenol . Would like a call back.

## 2023-11-24 NOTE — Telephone Encounter (Signed)
 I called pts mother.  Pt continues to have headaches, no vision changes.  C/o head hurts).  Takes tylenol  am and pm when gets off work.  Has appts with pcp/ Dr Valinda us  on a scheduled regular basis.   No change from when last spoke to mother.  Ok to see NP?

## 2023-12-03 DIAGNOSIS — Z008 Encounter for other general examination: Secondary | ICD-10-CM | POA: Diagnosis not present

## 2023-12-09 DIAGNOSIS — Z1151 Encounter for screening for human papillomavirus (HPV): Secondary | ICD-10-CM | POA: Diagnosis not present

## 2023-12-09 DIAGNOSIS — N926 Irregular menstruation, unspecified: Secondary | ICD-10-CM | POA: Diagnosis not present

## 2023-12-09 DIAGNOSIS — Z124 Encounter for screening for malignant neoplasm of cervix: Secondary | ICD-10-CM | POA: Diagnosis not present

## 2023-12-09 DIAGNOSIS — Z130 Encounter for screening for diseases of the blood and blood-forming organs and certain disorders involving the immune mechanism: Secondary | ICD-10-CM | POA: Diagnosis not present

## 2023-12-09 DIAGNOSIS — Z1231 Encounter for screening mammogram for malignant neoplasm of breast: Secondary | ICD-10-CM | POA: Diagnosis not present

## 2023-12-09 DIAGNOSIS — Z1389 Encounter for screening for other disorder: Secondary | ICD-10-CM | POA: Diagnosis not present

## 2023-12-09 DIAGNOSIS — Z01419 Encounter for gynecological examination (general) (routine) without abnormal findings: Secondary | ICD-10-CM | POA: Diagnosis not present

## 2023-12-30 ENCOUNTER — Telehealth: Payer: Self-pay | Admitting: Neurology

## 2023-12-30 DIAGNOSIS — R4782 Fluency disorder in conditions classified elsewhere: Secondary | ICD-10-CM

## 2023-12-30 NOTE — Telephone Encounter (Signed)
 Pt's mother called wanting to know if the pt can get a referral for the PT office next door for speech therapy. Please advise.

## 2023-12-31 ENCOUNTER — Telehealth: Payer: Self-pay | Admitting: Neurology

## 2023-12-31 NOTE — Telephone Encounter (Signed)
 Thank you, signed, just sending back, not sue if patient's mom wanted you to let her know if I signd it thanks

## 2023-12-31 NOTE — Telephone Encounter (Signed)
 I called mother of pt.  Pt is doing well.  She asked if pt can continue her Speech Therapy (she would need a new referral for this).  The last time ordered pt when for some visits but then cancelled by mother (thinking she did not need it) but is now wanting to resume. Ok to reorder.  You last saw pt 10-16-2023.

## 2023-12-31 NOTE — Telephone Encounter (Signed)
 Referral for speech therapy sent through [facility] to [name]. Phone: [phone]

## 2024-01-12 NOTE — Progress Notes (Deleted)
 Regina Wood [facility] [address] [place] [address] Phone: [phone]   Assessment and Plan:     ***    Pertinent previous records reviewed include ***   Follow Up: ***     Subjective:   I, Chestine Reeves, am serving as a Neurosurgeon for Doctor Morene Mace   Chief Complaint: knee pain    HPI:    10/02/2023 Patient is a 43 year old female with knee pain. Patient states bilateral knee pain left is worse than right. A month of pain. Antalgic gait. Numbness and tingling hx of diabetes. Patellar tendon pain. Decreased ROM pain when moving feels a catching sensation    11/18/2023 Patient states that her legs hurt the same as when she came the first time   01/13/2024 Patient states   Relevant Historical Information: DM type II, hypertension, developmental delay  Additional pertinent review of systems negative.   Current Outpatient Medications:    acetaminophen  (TYLENOL ) 500 MG tablet, Take 1,000 mg by mouth every 6 (six) hours as needed for moderate pain., Disp: , Rfl:    acetaZOLAMIDE  (DIAMOX ) 250 MG tablet, Take 2 tablets (500 mg total) by mouth 2 (two) times daily., Disp: 120 tablet, Rfl: 0   albuterol  (VENTOLIN  HFA) 108 (90 Base) MCG/ACT inhaler, Inhale 2 puffs into the lungs every 6 (six) hours as needed for wheezing or shortness of breath., Disp: 8 g, Rfl: 2   benztropine  (COGENTIN ) 1 MG tablet, Take 1 mg by mouth at bedtime., Disp: , Rfl:    blood glucose meter kit and supplies, Dispense based on patient and insurance preference. Use up to four times daily as directed. (FOR ICD-10 E10.9, E11.9)., Disp: 1 each, Rfl: 0   dapagliflozin  propanediol (FARXIGA ) 5 MG TABS tablet, Take 5 mg by mouth daily., Disp: , Rfl:    famotidine  (PEPCID ) 20 MG tablet, One after supper, Disp: 30 tablet, Rfl: 11   losartan  (COZAAR ) 100 MG tablet, Take 100 mg by mouth daily., Disp: , Rfl:    meloxicam  (MOBIC ) 15 MG tablet, Take 1 tablet (15 mg  total) by mouth daily., Disp: 30 tablet, Rfl: 0   metFORMIN  (GLUCOPHAGE -XR) 750 MG 24 hr tablet, Take 1,500 mg by mouth daily with breakfast., Disp: , Rfl:    MOUNJARO 5 MG/0.5ML Pen, Inject 5 mg into the skin once a week., Disp: , Rfl:    nebivolol (BYSTOLIC) 2.5 MG tablet, Take 2.5 mg by mouth daily., Disp: , Rfl:    ondansetron  (ZOFRAN -ODT) 8 MG disintegrating tablet, Take 1 tablet (8 mg total) by mouth every 8 (eight) hours as needed for nausea or vomiting., Disp: 10 tablet, Rfl: 0   pantoprazole  (PROTONIX ) 40 MG tablet, TAKE 1 TABLET (40 MG TOTAL) BY MOUTH DAILY. TAKE 30-60 MIN BEFORE FIRST MEAL OF THE DAY, Disp: 90 tablet, Rfl: 1   pregabalin  (LYRICA ) 200 MG capsule, TAKE 1 CAPSULE BY MOUTH TWICE A DAY, Disp: 60 capsule, Rfl: 1   Spacer/Aero-Holding Chambers (AEROCHAMBER MV) inhaler, Use as instructed, Disp: 1 each, Rfl: 0   topiramate  (TOPAMAX ) 100 MG tablet, Take 2.5 tablets (250 mg total) by mouth at bedtime. Then can increase to 300mg  at bedtime if needed(3 tabs) (Patient taking differently: Take 100 mg by mouth at bedtime. Then can increase to 300mg  at bedtime if needed(3 tabs)), Disp: 270 tablet, Rfl: 3   Objective:     There were no vitals filed for this visit.    There is no height or  weight on file to calculate BMI.    Physical Exam:    ***   Electronically signed by:  Odis Mace [facility] 12:06 PM 01/12/24

## 2024-01-13 ENCOUNTER — Ambulatory Visit: Admitting: Sports Medicine

## 2024-01-14 DIAGNOSIS — Z23 Encounter for immunization: Secondary | ICD-10-CM | POA: Diagnosis not present

## 2024-01-14 DIAGNOSIS — I10 Essential (primary) hypertension: Secondary | ICD-10-CM | POA: Diagnosis not present

## 2024-01-14 DIAGNOSIS — M25551 Pain in right hip: Secondary | ICD-10-CM | POA: Diagnosis not present

## 2024-01-14 DIAGNOSIS — G932 Benign intracranial hypertension: Secondary | ICD-10-CM | POA: Diagnosis not present

## 2024-01-14 DIAGNOSIS — R109 Unspecified abdominal pain: Secondary | ICD-10-CM | POA: Diagnosis not present

## 2024-01-14 DIAGNOSIS — F79 Unspecified intellectual disabilities: Secondary | ICD-10-CM | POA: Diagnosis not present

## 2024-01-14 DIAGNOSIS — E1121 Type 2 diabetes mellitus with diabetic nephropathy: Secondary | ICD-10-CM | POA: Diagnosis not present

## 2024-01-15 DIAGNOSIS — F411 Generalized anxiety disorder: Secondary | ICD-10-CM | POA: Diagnosis not present

## 2024-01-15 DIAGNOSIS — F72 Severe intellectual disabilities: Secondary | ICD-10-CM | POA: Diagnosis not present

## 2024-01-15 DIAGNOSIS — F849 Pervasive developmental disorder, unspecified: Secondary | ICD-10-CM | POA: Diagnosis not present

## 2024-01-22 ENCOUNTER — Ambulatory Visit: Admitting: Neurology

## 2024-01-26 ENCOUNTER — Other Ambulatory Visit: Payer: Self-pay | Admitting: Neurology

## 2024-01-26 DIAGNOSIS — G629 Polyneuropathy, unspecified: Secondary | ICD-10-CM

## 2024-02-03 ENCOUNTER — Other Ambulatory Visit: Payer: Self-pay | Admitting: Internal Medicine

## 2024-02-03 DIAGNOSIS — R10A1 Flank pain, right side: Secondary | ICD-10-CM

## 2024-02-09 ENCOUNTER — Ambulatory Visit
Admission: RE | Admit: 2024-02-09 | Discharge: 2024-02-09 | Disposition: A | Discharge status: Home / Self Care | Source: Ambulatory Visit | Attending: Internal Medicine | Admitting: Internal Medicine

## 2024-02-09 DIAGNOSIS — K5730 Diverticulosis of large intestine without perforation or abscess without bleeding: Secondary | ICD-10-CM | POA: Diagnosis not present

## 2024-02-09 DIAGNOSIS — R10A1 Flank pain, right side: Secondary | ICD-10-CM

## 2024-02-09 MED ORDER — IOPAMIDOL (ISOVUE-300) INJECTION 61%
100.0000 mL | Freq: Once | INTRAVENOUS | Status: AC | PRN
Start: 2024-02-09 — End: 2024-02-09
  Administered 2024-02-09: 100 mL via INTRAVENOUS

## 2024-02-18 ENCOUNTER — Other Ambulatory Visit: Payer: Self-pay | Admitting: Internal Medicine

## 2024-02-18 NOTE — Telephone Encounter (Signed)
 LOV notes do not indicate whether to continue or d/c the Pepcid . Please advise, thank you!

## 2024-02-24 ENCOUNTER — Other Ambulatory Visit: Payer: Self-pay | Admitting: Gynecology

## 2024-02-24 DIAGNOSIS — M5451 Vertebrogenic low back pain: Secondary | ICD-10-CM | POA: Diagnosis not present

## 2024-02-24 DIAGNOSIS — M419 Scoliosis, unspecified: Secondary | ICD-10-CM | POA: Diagnosis not present

## 2024-02-24 DIAGNOSIS — N83201 Unspecified ovarian cyst, right side: Secondary | ICD-10-CM

## 2024-02-24 DIAGNOSIS — M41125 Adolescent idiopathic scoliosis, thoracolumbar region: Secondary | ICD-10-CM | POA: Diagnosis not present

## 2024-02-24 DIAGNOSIS — Z1330 Encounter for screening examination for mental health and behavioral disorders, unspecified: Secondary | ICD-10-CM | POA: Diagnosis not present

## 2024-02-24 DIAGNOSIS — M5416 Radiculopathy, lumbar region: Secondary | ICD-10-CM | POA: Diagnosis not present

## 2024-02-27 ENCOUNTER — Encounter: Payer: Self-pay | Admitting: Gynecology

## 2024-03-01 ENCOUNTER — Encounter: Payer: Self-pay | Admitting: Gynecology

## 2024-03-03 ENCOUNTER — Other Ambulatory Visit

## 2024-03-09 ENCOUNTER — Encounter: Payer: Self-pay | Admitting: Diagnostic Neuroimaging

## 2024-03-09 ENCOUNTER — Ambulatory Visit: Admitting: Neurology

## 2024-03-09 ENCOUNTER — Ambulatory Visit: Admitting: Diagnostic Neuroimaging

## 2024-03-23 ENCOUNTER — Ambulatory Visit: Attending: Neurology | Admitting: Speech Pathology

## 2024-03-23 ENCOUNTER — Other Ambulatory Visit: Payer: Self-pay

## 2024-03-23 DIAGNOSIS — R41841 Cognitive communication deficit: Secondary | ICD-10-CM | POA: Diagnosis not present

## 2024-03-23 DIAGNOSIS — R4782 Fluency disorder in conditions classified elsewhere: Secondary | ICD-10-CM | POA: Diagnosis not present

## 2024-03-23 DIAGNOSIS — R471 Dysarthria and anarthria: Secondary | ICD-10-CM | POA: Diagnosis not present

## 2024-03-23 DIAGNOSIS — M5416 Radiculopathy, lumbar region: Secondary | ICD-10-CM | POA: Diagnosis not present

## 2024-03-23 DIAGNOSIS — M41125 Adolescent idiopathic scoliosis, thoracolumbar region: Secondary | ICD-10-CM | POA: Diagnosis not present

## 2024-03-23 DIAGNOSIS — M5451 Vertebrogenic low back pain: Secondary | ICD-10-CM | POA: Diagnosis not present

## 2024-03-23 NOTE — Therapy (Signed)
 OUTPATIENT SPEECH LANGUAGE PATHOLOGY EVALUATION   Patient Name: Regina Wood MRN: [id number] DOB:09/15/1980, 43 y.o., female Today's Date: 03/23/2024  PCP: Stephane Leita DEL, MD REFERRING PROVIDER: Ines Onetha NOVAK, MD  END OF SESSION:  End of Session - 03/23/24 1340     Visit Number 1    Number of Visits 5    Date for Recertification  04/20/24    SLP Start Time 1315    SLP Stop Time  1340    SLP Time Calculation (min) 25 min    Activity Tolerance Patient tolerated treatment well          Past Medical History:  Diagnosis Date   Back pain    Bilateral swelling of feet    CTS (carpal tunnel syndrome)    Diabetes mellitus without complication (HCC)    High blood pressure    High cholesterol    Idiopathic intracranial hypertension    Obesity    Pneumonia    SOB (shortness of breath)    Past Surgical History:  Procedure Laterality Date   NO PAST SURGERIES     RADIOLOGY WITH ANESTHESIA N/A 06/19/2023   Procedure: MRI/MRV HEAD WITH AND WITOUT CONTRAST WITH ANESTHESIA;  Surgeon: Radiologist, Medication, MD;  Location: [facility] OR;  Service: Radiology;  Laterality: N/A;   Patient Active Problem List   Diagnosis Date Noted   Transverse sinus thrombosis 10/17/2023   Vitamin D  deficiency 07/22/2023   Hyperlipidemia associated with type 2 diabetes mellitus (HCC) 07/08/2023   Class 3 severe obesity due to excess calories with serious comorbidity and body mass index (BMI) of 50.0 to 59.9 in adult (HCC) 03/20/2023   Type 2 diabetes mellitus with hyperglycemia, without long-term current use of insulin  (HCC) 03/20/2023   Asthma 03/13/2023   IIH (idiopathic intracranial hypertension) 02/22/2023   Papilledema 02/22/2023   Severe headache 02/22/2023   Vision loss 02/22/2023   Diabetic renal disease (HCC) 01/29/2023   Skin sensation disturbance 01/29/2023   Suspected sleep apnea 04/19/2022   Chronic respiratory failure (HCC) 03/07/2022   Abnormal CT of the chest 12/31/2019    Intellectual disability 07/07/2019   Morbid (severe) obesity due to excess calories (HCC) 07/07/2019   Hyperglycemia 04/21/2019   Hypertension associated with diabetes (HCC) 04/21/2019   Developmental delay 04/21/2019    ONSET DATE: Developmental   REFERRING DIAG:  R62.50 (ICD-10-CM) - Developmental delay    THERAPY DIAG:  Cognitive communication deficit  Dysarthria and anarthria  Rationale for Evaluation and Treatment: Habilitation  SUBJECTIVE:   SUBJECTIVE STATEMENT:  Pt accompanied by: self and family member  PERTINENT HISTORY: Regina Wood is a 43 y.o. female with PMHX for developmental delay, diabetes, diabetic nephropathy.  PAIN:  Are you having pain? No  FALLS: Has patient fallen in last 6 months?  No  LIVING ENVIRONMENT: Lives with: lives with their family Lives in: House/apartment  PLOF:  Level of assistance: Needed assistance with ADLs Employment: Volunteer work  PATIENT GOALS: To improve speech.   OBJECTIVE:  Note: Objective measures were completed at Evaluation unless otherwise noted.  DIAGNOSTIC FINDINGS: N/A  COGNITION: Overall cognitive status: History of cognitive impairments - at baseline Areas of impairment:  Awareness: Impaired: Intellectual, Emergent, and Anticipatory Behavior: Impulsive and Poor frustration tolerance  AUDITORY COMPREHENSION: Overall auditory comprehension: Appears intact YES/NO questions: Appears intact Following directions: Appears intact Conversation: Moderately Complex Interfering components: processing speed and baseline intellectual disability Effective technique: extra processing time, pausing, and visual/gestural cues   EXPRESSION: verbal  VERBAL EXPRESSION: Level  of generative/spontaneous verbalization: conversation Automatic speech: name: intact, counting: intact, and day of week: intact  Repetition: Appears intact Naming: Confrontation: 76-100% Pragmatics: Impaired: turn taking Comments: Pt's  verbal expression appears WFL in consideration of PMHx of developmental delay and intellectual disability. Pt has pragmatic deficits; however, these are likely the result of developmental delay/ID.  Interfering components: premorbid deficit Effective technique: Extra processing time, verbal cues for topic maintenance. Non-verbal means of communication: N/A  MOTOR SPEECH: Overall motor speech: impaired Level of impairment: Conversation Respiration: thoracic breathing Phonation: normal Resonance: WFL Articulation: Impaired: word and conversation Intelligibility: Intelligible Motor planning: Appears intact Motor speech errors: unaware and consistent Interfering components: Baseline cognitive-impairment Effective technique: slow rate, pause, and pacing  ORAL MOTOR EXAMINATION: Overall status: WFL Comments: N/A                                                                                                                             TREATMENT DATE:   03/23/24: No tx completed on this date.    PATIENT EDUCATION: Education details: Turn-taking during conversations. Person educated: Patient and Parent Education method: Explanation Education comprehension: verbalized understanding and needs further education   GOALS: Goals reviewed with patient? Yes  LONG TERM GOALS:  (LTGs=STGs) Target date: 04/20/2024  Pt will demonstrate emerging use of turn-taking during conversation when given frequent max A. Baseline: Pt does not utilize turn-taking. Goal status: INITIAL  2.  Pt and family will adhere to communication support strategies to optimize pt's functional communication given frequent max A. Baseline: Pt and family do not utilize communication support strategies.  Goal status: INITIAL  3.  Pt will achieve 97-100% speech intelligibility during structured conversation/roleplaying given frequent max A.  Baseline: 95-97% Goal status: INITIAL    ASSESSMENT:  CLINICAL  IMPRESSION: Patient is a 43 y.o. female who was seen today for a speech evaluation. Pt has a hx of developmental delay and intellectual disability. Pt presents with cognitive-communication deficits primarily in pragmatic conventions including turn-taking and inappropriate verbosity. Pt's mom notes that pt struggles with pragmatic communication during home interactions and at work; specifically, pt's mom states that pt has received complaints for talking too much at work. Pt has baseline cognitive-impairment from developmental delay and intellectual disability. Pt's speech is characterized by mostly articulatory deficits, most likely from hx of developmental delay.   OBJECTIVE IMPAIRMENTS: include expressive language, receptive language, and dysarthria. These impairments are limiting patient from effectively communicating at home and in community. Factors affecting potential to achieve goals and functional outcome are ability to learn/carryover information, cooperation/participation level, previous level of function, and family/community support. Patient will benefit from skilled SLP services to address above impairments and improve overall function.  REHAB POTENTIAL: Fair    PLAN:  SLP FREQUENCY: 1x/week  SLP DURATION: 4 weeks  PLANNED INTERVENTIONS: Environmental controls, Cueing hierachy, Internal/external aids, Multimodal communication approach, SLP instruction and feedback, Compensatory strategies, and Patient/family education    Regina Wood, CCC-SLP 03/23/2024, 2:13  PM

## 2024-03-24 DIAGNOSIS — H4710 Unspecified papilledema: Secondary | ICD-10-CM | POA: Diagnosis not present

## 2024-03-25 ENCOUNTER — Encounter: Payer: Self-pay | Admitting: Emergency Medicine

## 2024-03-25 ENCOUNTER — Ambulatory Visit (INDEPENDENT_AMBULATORY_CARE_PROVIDER_SITE_OTHER): Admitting: Emergency Medicine

## 2024-03-25 VITALS — BP 126/76 | HR 90 | Temp 97.6°F | Ht 59.0 in | Wt 262.8 lb

## 2024-03-25 DIAGNOSIS — R9389 Abnormal findings on diagnostic imaging of other specified body structures: Secondary | ICD-10-CM

## 2024-03-25 DIAGNOSIS — J4520 Mild intermittent asthma, uncomplicated: Secondary | ICD-10-CM

## 2024-03-25 MED ORDER — ALBUTEROL SULFATE HFA 108 (90 BASE) MCG/ACT IN AERS: 2.0000 | INHALATION_SPRAY | RESPIRATORY_TRACT | 2 refills | Status: AC | PRN

## 2024-03-25 NOTE — Assessment & Plan Note (Signed)
 Presumed asthma based on her clinical status, possible air trapping on imaging.  She is doing quite well, does not have any evidence of bronchospasm, has a reasonable exercise tolerance although she does have shortness of breath associated with her obesity.  She has albuterol  available to use if needed but she never required it.  She is not on any scheduled BD therapy I do not think we need to start this at this time.  Continue to follow her clinically.

## 2024-03-25 NOTE — Patient Instructions (Signed)
 It is good to see you today.  I am glad that you are doing well. We we will refill your albuterol  inhaler.  You can use 2 puffs if you need it for shortness of breath, chest tightness, wheezing. Continue to stay active and work on your exercise, conditioning and weight loss. Follow with Dr. Shelah in 1 year.  Please call sooner if you develop any new breathing issues or respiratory problems.

## 2024-03-25 NOTE — Assessment & Plan Note (Signed)
 CT chest in the past with ground glass and a mosaic pattern suggestive of possible pneumonitis versus air trapping.  Given her clinical stability, subsequent imaging I do not think we need to continue to do dedicated scans.  She did have a CT abdomen in October that did not appear to show any ground glass in the visualized lungs.

## 2024-03-25 NOTE — Progress Notes (Signed)
   Subjective:    Patient ID: Regina Wood, female    DOB: [birth year], 43 y.o.   MRN: [id number]  Asthma Her past medical history is significant for asthma.    ROV 03/13/2023 --43 year old woman with history of obesity, diabetes, developmental delay and hypertension, suspected asthma.  She has also been noted to have areas of patchy bilateral groundglass consistent with either pneumonitis or air trapping.  She saw Dr. Darlean in September with URI symptoms but no evidence for bronchospasm.  Repeat CT scan of the chest was done 12/05/2022. She has stable exertional SOB. Gets SOB with about 50-100 ft. They are working on wt loss and having some success. She is on nocturnal O2.   CT scan of the chest 12/05/2022 reviewed by me, shows no lymphadenopathy, no significant change in mosaic attenuation, most consistent with air trapping  ROV 03/25/2024 --follow-up visit 43 year old woman with history of obesity, diabetes, developmental Leh, hypertension, suspected asthma.  I seen her for her asthma and also patchy areas of bilateral ground glass consistent with either air trapping or pneumonitis.  These have been stable on serial imaging.  She has been unable to do formal pulmonary function testing.  She has tried Dulera  without significant clinical benefit does have albuterol  if needed. She has been doing fairly well. No cough, no real SOB. She has albuterol  but does not every use it.     Vitals:   03/25/24 1606  BP: 126/76  Pulse: 90  Temp: 97.6 F (36.4 C)  SpO2: 96%  Weight: 262 lb 12.8 oz (119.2 kg)  Height: 4' 11 (1.499 m)  Gen: Pleasant, obese woman, in no distress   ENT: No lesions,  mouth clear,  oropharynx clear, no postnasal drip   Neck: No JVD, no stridor   Lungs: No use of accessory muscles, very decreased at both bases, distant, no crackles or wheezing on normal respiration   Cardiovascular: RRR, heart sounds normal, no murmur or gallops, no peripheral edema   Musculoskeletal: No  deformities, no cyanosis or clubbing   Neuro: alert, awake, developmental delay and depends on her parents to answer most questions.    Skin: Warm, no lesions or rash  Abnormal CT of the chest CT chest in the past with ground glass and a mosaic pattern suggestive of possible pneumonitis versus air trapping.  Given her clinical stability, subsequent imaging I do not think we need to continue to do dedicated scans.  She did have a CT abdomen in October that did not appear to show any ground glass in the visualized lungs.  Asthma Presumed asthma based on her clinical status, possible air trapping on imaging.  She is doing quite well, does not have any evidence of bronchospasm, has a reasonable exercise tolerance although she does have shortness of breath associated with her obesity.  She has albuterol  available to use if needed but she never required it.  She is not on any scheduled BD therapy I do not think we need to start this at this time.  Continue to follow her clinically.     Lamar Chris, MD, PhD 03/25/2024, 4:41 PM [facility] [phone] or if no answer [phone]

## 2024-03-29 ENCOUNTER — Ambulatory Visit: Admitting: Speech Pathology

## 2024-03-29 ENCOUNTER — Encounter: Payer: Self-pay | Admitting: Speech Pathology

## 2024-03-29 DIAGNOSIS — R41841 Cognitive communication deficit: Secondary | ICD-10-CM

## 2024-03-29 DIAGNOSIS — R471 Dysarthria and anarthria: Secondary | ICD-10-CM

## 2024-03-29 DIAGNOSIS — R4782 Fluency disorder in conditions classified elsewhere: Secondary | ICD-10-CM | POA: Diagnosis not present

## 2024-03-29 NOTE — Therapy (Signed)
 OUTPATIENT SPEECH LANGUAGE PATHOLOGY TREATMENT   Patient Name: Regina Wood MRN: [id number] DOB:08-10-1980, 43 y.o., female Today's Date: 03/29/2024  PCP: Regina Leita DEL, MD REFERRING PROVIDER: Ines Onetha NOVAK, MD  END OF SESSION:  End of Session - 03/29/24 1455     Visit Number 2    Number of Visits 5    Date for Recertification  04/20/24    SLP Start Time 1447    SLP Stop Time  1530    SLP Time Calculation (min) 43 min    Activity Tolerance Patient tolerated treatment well          Past Medical History:  Diagnosis Date   Back pain    Bilateral swelling of feet    CTS (carpal tunnel syndrome)    Diabetes mellitus without complication (HCC)    High blood pressure    High cholesterol    Idiopathic intracranial hypertension    Obesity    Pneumonia    SOB (shortness of breath)    Past Surgical History:  Procedure Laterality Date   NO PAST SURGERIES     RADIOLOGY WITH ANESTHESIA N/A 06/19/2023   Procedure: MRI/MRV HEAD WITH AND WITOUT CONTRAST WITH ANESTHESIA;  Surgeon: Radiologist, Medication, MD;  Location: [facility] OR;  Service: Radiology;  Laterality: N/A;   Patient Active Problem List   Diagnosis Date Noted   Transverse sinus thrombosis 10/17/2023   Vitamin D  deficiency 07/22/2023   Hyperlipidemia associated with type 2 diabetes mellitus (HCC) 07/08/2023   Class 3 severe obesity due to excess calories with serious comorbidity and body mass index (BMI) of 50.0 to 59.9 in adult (HCC) 03/20/2023   Type 2 diabetes mellitus with hyperglycemia, without long-term current use of insulin  (HCC) 03/20/2023   Asthma 03/13/2023   IIH (idiopathic intracranial hypertension) 02/22/2023   Papilledema 02/22/2023   Severe headache 02/22/2023   Vision loss 02/22/2023   Diabetic renal disease (HCC) 01/29/2023   Skin sensation disturbance 01/29/2023   Suspected sleep apnea 04/19/2022   Chronic respiratory failure (HCC) 03/07/2022   Abnormal CT of the chest 12/31/2019    Intellectual disability 07/07/2019   Morbid (severe) obesity due to excess calories (HCC) 07/07/2019   Hyperglycemia 04/21/2019   Hypertension associated with diabetes (HCC) 04/21/2019   Developmental delay 04/21/2019    ONSET DATE: Developmental   REFERRING DIAG:  R62.50 (ICD-10-CM) - Developmental delay    THERAPY DIAG:  Cognitive communication deficit  Dysarthria and anarthria  Rationale for Evaluation and Treatment: Habilitation  SUBJECTIVE:   SUBJECTIVE STATEMENT:  Pt accompanied by: self and family member  PERTINENT HISTORY: Regina Wood is a 43 y.o. female with PMHX for developmental delay, diabetes, diabetic nephropathy.  PAIN:  Are you having pain? No  FALLS: Has patient fallen in last 6 months?  No  LIVING ENVIRONMENT: Lives with: lives with their family Lives in: House/apartment  PLOF:  Level of assistance: Needed assistance with ADLs Employment: Volunteer work  PATIENT GOALS: To improve speech.   OBJECTIVE:  Note: Objective measures were completed at Evaluation unless otherwise noted.  DIAGNOSTIC FINDINGS: N/A  COGNITION: Overall cognitive status: History of cognitive impairments - at baseline Areas of impairment:  Awareness: Impaired: Intellectual, Emergent, and Anticipatory Behavior: Impulsive and Poor frustration tolerance  AUDITORY COMPREHENSION: Overall auditory comprehension: Appears intact YES/NO questions: Appears intact Following directions: Appears intact Conversation: Moderately Complex Interfering components: processing speed and baseline intellectual disability Effective technique: extra processing time, pausing, and visual/gestural cues   EXPRESSION: verbal  VERBAL EXPRESSION: Level  of generative/spontaneous verbalization: conversation Automatic speech: name: intact, counting: intact, and day of week: intact  Repetition: Appears intact Naming: Confrontation: 76-100% Pragmatics: Impaired: turn taking Comments: Pt's  verbal expression appears WFL in consideration of PMHx of developmental delay and intellectual disability. Pt has pragmatic deficits; however, these are likely the result of developmental delay/ID.  Interfering components: premorbid deficit Effective technique: Extra processing time, verbal cues for topic maintenance. Non-verbal means of communication: N/A  MOTOR SPEECH: Overall motor speech: impaired Level of impairment: Conversation Respiration: thoracic breathing Phonation: normal Resonance: WFL Articulation: Impaired: word and conversation Intelligibility: Intelligible Motor planning: Appears intact Motor speech errors: unaware and consistent Interfering components: Baseline cognitive-impairment Effective technique: slow rate, pause, and pacing  ORAL MOTOR EXAMINATION: Overall status: WFL Comments: N/A                                                                                                                             TREATMENT DATE:   03/29/24: Targeted compensatory strategies to stop hyperverbose conversation - with modeling and cue to catch a bubble and count to 10 forward and backward on her fingers with mouth closed and bubble in mouth - Regina Wood completed this 6/6 trials in conversation. Targeted turn taking by generating 4 you questions she can ask her listener - role played turn taking with occasional min A Regina Wood generated 2 questions to ask me. Targeted order at [employer] - with rare min A Regina Wood used tapping her fingers to separate egg-drop-soup and egg-roll - no- broccoli to slow rate to improve intelligibility and independence ordering.   03/23/24: No tx completed on this date.    PATIENT EDUCATION: Education details: Turn-taking during conversations. Person educated: Patient and Parent Education method: Explanation Education comprehension: verbalized understanding and needs further education   GOALS: Goals reviewed with patient? Yes  LONG TERM GOALS:   (LTGs=STGs) Target date: 04/20/2024  Pt will demonstrate emerging use of turn-taking during conversation when given frequent max A. Baseline: Pt does not utilize turn-taking. Goal status: ONGOING  2.  Pt and family will adhere to communication support strategies to optimize pt's functional communication given frequent max A. Baseline: Pt and family do not utilize communication support strategies.  Goal status: ONGOING  3.  Pt will achieve 97-100% speech intelligibility during structured conversation/roleplaying given frequent max A.  Baseline: 95-97% Goal status: ONGOING    ASSESSMENT:  CLINICAL IMPRESSION: Patient is a 43 y.o. female who was seen today for a speech evaluation. Pt has a hx of developmental delay and intellectual disability. Pt presents with cognitive-communication deficits primarily in pragmatic conventions including turn-taking and inappropriate verbosity. Pt's mom notes that pt struggles with pragmatic communication during home interactions and at work; specifically, pt's mom states that pt has received complaints for talking too much at work. Pt has baseline cognitive-impairment from developmental delay and intellectual disability. Pt's speech is characterized by mostly articulatory deficits, most likely from hx of developmental delay.   OBJECTIVE IMPAIRMENTS:  include expressive language, receptive language, and dysarthria. These impairments are limiting patient from effectively communicating at home and in community. Factors affecting potential to achieve goals and functional outcome are ability to learn/carryover information, cooperation/participation level, previous level of function, and family/community support. Patient will benefit from skilled SLP services to address above impairments and improve overall function.  REHAB POTENTIAL: Fair    PLAN:  SLP FREQUENCY: 1x/week  SLP DURATION: 4 weeks  PLANNED INTERVENTIONS: Environmental controls, Cueing hierachy,  Internal/external aids, Multimodal communication approach, SLP instruction and feedback, Compensatory strategies, and Patient/family education    Mathis Leita Caldron, CCC-SLP 03/29/2024, 3:28 PM

## 2024-03-29 NOTE — Patient Instructions (Addendum)
   When [name] is talking too much, we tried catch a bubble where she fills cheeks with air  We also practice counting using fingers only with not talking with the bubble in her mouth to 10 forward and back  We practiced asking you questions to her communication partner to promote turn taking  What are you doing for Thanksgiving  Tell me about your pets  Who is in your family  What are your working on today  How are you feeling today

## 2024-04-13 DIAGNOSIS — Z1339 Encounter for screening examination for other mental health and behavioral disorders: Secondary | ICD-10-CM | POA: Diagnosis not present

## 2024-04-13 DIAGNOSIS — Z1331 Encounter for screening for depression: Secondary | ICD-10-CM | POA: Diagnosis not present

## 2024-04-22 ENCOUNTER — Telehealth: Payer: Self-pay | Admitting: Neurology

## 2024-04-22 DIAGNOSIS — G932 Benign intracranial hypertension: Secondary | ICD-10-CM

## 2024-04-22 MED ORDER — ACETAZOLAMIDE 250 MG PO TABS: 250.0000 mg | ORAL_TABLET | Freq: Two times a day (BID) | ORAL | 11 refills | Status: DC

## 2024-04-22 NOTE — Telephone Encounter (Signed)
 Reviewed ophthalmologist Dr. Octavia evaluation from March 24, 2024, hyperlipoidemia, swelling looks a little worse on OCT, increased acetazolamide  to 250 mg twice a day,  Patient was last seen by Dr. 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  Follow up Jan 19th 2026

## 2024-04-26 ENCOUNTER — Other Ambulatory Visit: Payer: Self-pay | Admitting: *Deleted

## 2024-04-26 MED ORDER — TOPIRAMATE 100 MG PO TABS: 200.0000 mg | ORAL_TABLET | Freq: Every day | ORAL | 6 refills | Status: DC

## 2024-04-26 NOTE — Telephone Encounter (Signed)
 Last seen by Dr.Ahern on 10/16/23 Follow up scheduled on 05/24/24  Dr.Penumalli you are work in [employer]  Rx is pending for response

## 2024-05-10 ENCOUNTER — Telehealth: Payer: Self-pay | Admitting: Neurology

## 2024-05-10 MED ORDER — DIAZEPAM 5 MG PO TABS: ORAL_TABLET | ORAL | 0 refills | Status: AC

## 2024-05-10 NOTE — Telephone Encounter (Signed)
 Dr.Yan see the below patient is scheduled on 05/15/23 for LP would like Rx sent to the below pharmacy.   Please advise

## 2024-05-10 NOTE — Telephone Encounter (Signed)
Left message on voice mail.  Rx has been sent

## 2024-05-10 NOTE — Telephone Encounter (Signed)
 Meds ordered this encounter  Medications   diazepam  (VALIUM ) 5 MG tablet    Sig: Take 1-2 tablets 30 minutes prior to lumbar puncture,  may repeat once as needed. Must have driver.    Dispense:  3 tablet    Refill:  0

## 2024-05-10 NOTE — Telephone Encounter (Signed)
 Pt mother called to request  for Md to make order for Pt to get diazepam  Pt has an Xray coming up . Pt mother was informed to  follow up with MD.  Pt medication can be sent to  [id number] [place], [place] - [address] Phone: [phone]  Fax: [phone]

## 2024-05-12 ENCOUNTER — Encounter: Payer: Self-pay | Admitting: Speech Pathology

## 2024-05-12 ENCOUNTER — Ambulatory Visit: Attending: Neurology | Admitting: Speech Pathology

## 2024-05-12 DIAGNOSIS — R41841 Cognitive communication deficit: Secondary | ICD-10-CM | POA: Insufficient documentation

## 2024-05-12 DIAGNOSIS — R471 Dysarthria and anarthria: Secondary | ICD-10-CM | POA: Insufficient documentation

## 2024-05-12 NOTE — Patient Instructions (Addendum)
" ° °  When she needs to stop talking - try catch a Bubble and have her breathe 5 big breaths  Use stop sign as visual to catch a bubble  Model puffing cheeks to cue catch a bubble - count to 5 or 10 "

## 2024-05-12 NOTE — Therapy (Signed)
 " OUTPATIENT SPEECH LANGUAGE PATHOLOGY TREATMENT   Patient Name: Regina Wood MRN: [id number] DOB:[birth year], 44 y.o., female Today's Date: 05/12/2024  PCP: Regina Leita DEL, MD REFERRING PROVIDER: Ines Onetha NOVAK, MD  END OF SESSION:  End of Session - 05/12/24 1319     Visit Number 3    Number of Visits 5    Date for Recertification  06/23/24    SLP Start Time 1230    SLP Stop Time  1315    SLP Time Calculation (min) 45 min    Activity Tolerance Patient tolerated treatment well          Past Medical History:  Diagnosis Date   Back pain    Bilateral swelling of feet    CTS (carpal tunnel syndrome)    Diabetes mellitus without complication (HCC)    High blood pressure    High cholesterol    Idiopathic intracranial hypertension    Obesity    Pneumonia    SOB (shortness of breath)    Past Surgical History:  Procedure Laterality Date   NO PAST SURGERIES     RADIOLOGY WITH ANESTHESIA N/A 06/19/2023   Procedure: MRI/MRV HEAD WITH AND WITOUT CONTRAST WITH ANESTHESIA;  Surgeon: Radiologist, Medication, MD;  Location: [facility] OR;  Service: Radiology;  Laterality: N/A;   Patient Active Problem List   Diagnosis Date Noted   Transverse sinus thrombosis 10/17/2023   Vitamin D  deficiency 07/22/2023   Hyperlipidemia associated with type 2 diabetes mellitus (HCC) 07/08/2023   Class 3 severe obesity due to excess calories with serious comorbidity and body mass index (BMI) of 50.0 to 59.9 in adult (HCC) 03/20/2023   Type 2 diabetes mellitus with hyperglycemia, without long-term current use of insulin  (HCC) 03/20/2023   Asthma 03/13/2023   IIH (idiopathic intracranial hypertension) 02/22/2023   Papilledema 02/22/2023   Severe headache 02/22/2023   Vision loss 02/22/2023   Diabetic renal disease (HCC) 01/29/2023   Skin sensation disturbance 01/29/2023   Suspected sleep apnea 04/19/2022   Chronic respiratory failure (HCC) 03/07/2022   Abnormal CT of the chest 12/31/2019    Intellectual disability 07/07/2019   Morbid (severe) obesity due to excess calories (HCC) 07/07/2019   Hyperglycemia 04/21/2019   Hypertension associated with diabetes (HCC) 04/21/2019   Developmental delay 04/21/2019    ONSET DATE: Developmental   REFERRING DIAG:  R62.50 (ICD-10-CM) - Developmental delay    THERAPY DIAG:  Cognitive communication deficit  Dysarthria and anarthria  Rationale for Evaluation and Treatment: Habilitation  SUBJECTIVE:   SUBJECTIVE STATEMENT:  Pt accompanied by: self and family member  PERTINENT HISTORY: Regina Wood is a 44 y.o. female with PMHX for developmental delay, diabetes, diabetic nephropathy.  PAIN:  Are you having pain? No  FALLS: Has patient fallen in last 6 months?  No  LIVING ENVIRONMENT: Lives with: lives with their family Lives in: House/apartment  PLOF:  Level of assistance: Needed assistance with ADLs Employment: Volunteer work  PATIENT GOALS: To improve speech.   OBJECTIVE:  Note: Objective measures were completed at Evaluation unless otherwise noted.  DIAGNOSTIC FINDINGS: N/A  COGNITION: Overall cognitive status: History of cognitive impairments - at baseline Areas of impairment:  Awareness: Impaired: Intellectual, Emergent, and Anticipatory Behavior: Impulsive and Poor frustration tolerance  AUDITORY COMPREHENSION: Overall auditory comprehension: Appears intact YES/NO questions: Appears intact Following directions: Appears intact Conversation: Moderately Complex Interfering components: processing speed and baseline intellectual disability Effective technique: extra processing time, pausing, and visual/gestural cues   EXPRESSION: verbal  VERBAL EXPRESSION:  Level of generative/spontaneous verbalization: conversation Automatic speech: name: intact, counting: intact, and day of week: intact  Repetition: Appears intact Naming: Confrontation: 76-100% Pragmatics: Impaired: turn taking Comments: Pt's  verbal expression appears WFL in consideration of PMHx of developmental delay and intellectual disability. Pt has pragmatic deficits; however, these are likely the result of developmental delay/ID.  Interfering components: premorbid deficit Effective technique: Extra processing time, verbal cues for topic maintenance. Non-verbal means of communication: N/A  MOTOR SPEECH: Overall motor speech: impaired Level of impairment: Conversation Respiration: thoracic breathing Phonation: normal Resonance: WFL Articulation: Impaired: word and conversation Intelligibility: Intelligible Motor planning: Appears intact Motor speech errors: unaware and consistent Interfering components: Baseline cognitive-impairment Effective technique: slow rate, pause, and pacing  ORAL MOTOR EXAMINATION: Overall status: WFL Comments: N/A                                                                                                                             TREATMENT DATE:    05/12/24: Pt returns with caregiver, Regina Wood. They had not implemented strategies to stop hyper-verbosity. Trained Regina Wood and Regina Wood on strategy of catch a bubble puffing cheeks to keep mouth closed and breath 5-7 breaths. With verbal cue to catch a bubble and visual of counting breaths with my fingers, Regina Wood stopped talking 5/5 opportunities. I printed a stop sign as well as visual to stop talking, puff cheeks and take 5-7 breaths. Used stop sign intermittently during card sort task - Regina Wood stopped talking and took breaths with visual cue and modeling 5/5x.  Trained Regina Wood that Regina Wood will benefit from Regina Wood modeling the behavior in tandem initially to help train Regina Wood to the strategy. In conversation, Regina Wood followed either verbal cue to catch a bubble or visual cue of stop sign to stop talking and take 5 breaths with modeling.   03/29/24: Targeted compensatory strategies to stop hyperverbose conversation - with modeling and cue to catch a  bubble and count to 10 forward and backward on her fingers with mouth closed and bubble in mouth - Regina Wood completed this 6/6 trials in conversation. Targeted turn taking by generating 4 you questions she can ask her listener - role played turn taking with occasional min A Regina Wood generated 2 questions to ask me. Targeted order at [employer] - with rare min A Regina Wood used tapping her fingers to separate egg-drop-soup and egg-roll - no- broccoli to slow rate to improve intelligibility and independence ordering.   03/23/24: No tx completed on this date.    PATIENT EDUCATION: Education details: Turn-taking during conversations. Person educated: Patient and Parent Education method: Explanation Education comprehension: verbalized understanding and needs further education   GOALS: Goals reviewed with patient? Yes  LONG TERM GOALS:  (LTGs=STGs) Target date: 102/18/26  Pt will demonstrate emerging use of turn-taking during conversation when given frequent max A. Baseline: Pt does not utilize turn-taking. Goal status: ONGOING  2.  Pt and family will adhere to communication support strategies to optimize pt's functional  communication given frequent max A. Baseline: Pt and family do not utilize communication support strategies.  Goal status: ONGOING  3.  Pt will achieve 97-100% speech intelligibility during structured conversation/roleplaying given frequent max A.  Baseline: 95-97% Goal status: ONGOING    ASSESSMENT:  CLINICAL IMPRESSION: Patient is a 44 y.o. female who was seen today for a speech evaluation. Pt has a hx of developmental delay and intellectual disability. Pt presents with cognitive-communication deficits primarily in pragmatic conventions including turn-taking and inappropriate verbosity. Pt's mom notes that pt struggles with pragmatic communication during home interactions and at work; specifically, pt's mom states that pt has received complaints for talking too much at  work. Pt has baseline cognitive-impairment from developmental delay and intellectual disability. Pt's speech is characterized by mostly articulatory deficits, most likely from hx of developmental delay.   OBJECTIVE IMPAIRMENTS: include expressive language, receptive language, and dysarthria. These impairments are limiting patient from effectively communicating at home and in community. Factors affecting potential to achieve goals and functional outcome are ability to learn/carryover information, cooperation/participation level, previous level of function, and family/community support. Patient will benefit from skilled SLP services to address above impairments and improve overall function.  REHAB POTENTIAL: Fair    PLAN:  SLP FREQUENCY: 1x/week  SLP DURATION: 4 weeks  PLANNED INTERVENTIONS: Environmental controls, Cueing hierachy, Internal/external aids, Multimodal communication approach, SLP instruction and feedback, Compensatory strategies, and Patient/family education    Mathis Leita Caldron, CCC-SLP 05/12/2024, 1:19 PM      "

## 2024-05-13 NOTE — Discharge Instructions (Signed)

## 2024-05-14 ENCOUNTER — Ambulatory Visit
Admission: RE | Admit: 2024-05-14 | Discharge: 2024-05-14 | Disposition: A | Source: Ambulatory Visit | Attending: Neurology | Admitting: Neurology

## 2024-05-14 VITALS — BP 116/75 | HR 89

## 2024-05-14 DIAGNOSIS — G932 Benign intracranial hypertension: Secondary | ICD-10-CM

## 2024-05-18 ENCOUNTER — Telehealth: Payer: Self-pay | Admitting: Neurology

## 2024-05-18 NOTE — Telephone Encounter (Signed)
 Reviewed Lumbar puncture May 14, 2024 showed opening pressure of 24 cm water, only slightly elevated.  Spinal fluid testing showed mild elevation of total protein, 49, rest of the spinal fluid testing showed no significant abnormality.  Will continue current treatment, has pending appointment January 19,

## 2024-05-19 ENCOUNTER — Ambulatory Visit: Admitting: Speech Pathology

## 2024-05-19 ENCOUNTER — Encounter: Payer: Self-pay | Admitting: Speech Pathology

## 2024-05-19 DIAGNOSIS — R41841 Cognitive communication deficit: Secondary | ICD-10-CM | POA: Diagnosis not present

## 2024-05-19 DIAGNOSIS — R471 Dysarthria and anarthria: Secondary | ICD-10-CM

## 2024-05-19 NOTE — Therapy (Signed)
 " OUTPATIENT SPEECH LANGUAGE PATHOLOGY TREATMENT   Patient Name: Regina Wood MRN: [id number] DOB:Nov 15, 1980, 44 y.o., female Today's Date: 05/19/2024  PCP: Stephane Leita DEL, MD REFERRING PROVIDER: Ines Onetha NOVAK, MD  END OF SESSION:  End of Session - 05/19/24 1228     Visit Number 4    Number of Visits 5    Date for Recertification  06/23/24    SLP Start Time 1230    SLP Stop Time  1315    SLP Time Calculation (min) 45 min    Activity Tolerance Patient tolerated treatment well          Past Medical History:  Diagnosis Date   Back pain    Bilateral swelling of feet    CTS (carpal tunnel syndrome)    Diabetes mellitus without complication (HCC)    High blood pressure    High cholesterol    Idiopathic intracranial hypertension    Obesity    Pneumonia    SOB (shortness of breath)    Past Surgical History:  Procedure Laterality Date   NO PAST SURGERIES     RADIOLOGY WITH ANESTHESIA N/A 06/19/2023   Procedure: MRI/MRV HEAD WITH AND WITOUT CONTRAST WITH ANESTHESIA;  Surgeon: Radiologist, Medication, MD;  Location: [facility] OR;  Service: Radiology;  Laterality: N/A;   Patient Active Problem List   Diagnosis Date Noted   Transverse sinus thrombosis 10/17/2023   Vitamin D  deficiency 07/22/2023   Hyperlipidemia associated with type 2 diabetes mellitus (HCC) 07/08/2023   Class 3 severe obesity due to excess calories with serious comorbidity and body mass index (BMI) of 50.0 to 59.9 in adult (HCC) 03/20/2023   Type 2 diabetes mellitus with hyperglycemia, without long-term current use of insulin  (HCC) 03/20/2023   Asthma 03/13/2023   IIH (idiopathic intracranial hypertension) 02/22/2023   Papilledema 02/22/2023   Severe headache 02/22/2023   Vision loss 02/22/2023   Diabetic renal disease (HCC) 01/29/2023   Skin sensation disturbance 01/29/2023   Suspected sleep apnea 04/19/2022   Chronic respiratory failure (HCC) 03/07/2022   Abnormal CT of the chest 12/31/2019    Intellectual disability 07/07/2019   Morbid (severe) obesity due to excess calories (HCC) 07/07/2019   Hyperglycemia 04/21/2019   Hypertension associated with diabetes (HCC) 04/21/2019   Developmental delay 04/21/2019    ONSET DATE: Developmental   REFERRING DIAG:  R62.50 (ICD-10-CM) - Developmental delay    THERAPY DIAG:  Dysarthria and anarthria  Cognitive communication deficit  Rationale for Evaluation and Treatment: Habilitation  SUBJECTIVE:   SUBJECTIVE STATEMENT:  Pt accompanied by: self and family member  PERTINENT HISTORY: Regina Wood is a 44 y.o. female with PMHX for developmental delay, diabetes, diabetic nephropathy.  PAIN:  Are you having pain? No  FALLS: Has patient fallen in last 6 months?  No  LIVING ENVIRONMENT: Lives with: lives with their family Lives in: House/apartment  PLOF:  Level of assistance: Needed assistance with ADLs Employment: Volunteer work  PATIENT GOALS: To improve speech.   OBJECTIVE:  Note: Objective measures were completed at Evaluation unless otherwise noted.  DIAGNOSTIC FINDINGS: N/A  COGNITION: Overall cognitive status: History of cognitive impairments - at baseline Areas of impairment:  Awareness: Impaired: Intellectual, Emergent, and Anticipatory Behavior: Impulsive and Poor frustration tolerance  AUDITORY COMPREHENSION: Overall auditory comprehension: Appears intact YES/NO questions: Appears intact Following directions: Appears intact Conversation: Moderately Complex Interfering components: processing speed and baseline intellectual disability Effective technique: extra processing time, pausing, and visual/gestural cues   EXPRESSION: verbal  VERBAL EXPRESSION:  Level of generative/spontaneous verbalization: conversation Automatic speech: name: intact, counting: intact, and day of week: intact  Repetition: Appears intact Naming: Confrontation: 76-100% Pragmatics: Impaired: turn taking Comments: Pt's  verbal expression appears WFL in consideration of PMHx of developmental delay and intellectual disability. Pt has pragmatic deficits; however, these are likely the result of developmental delay/ID.  Interfering components: premorbid deficit Effective technique: Extra processing time, verbal cues for topic maintenance. Non-verbal means of communication: N/A  MOTOR SPEECH: Overall motor speech: impaired Level of impairment: Conversation Respiration: thoracic breathing Phonation: normal Resonance: WFL Articulation: Impaired: word and conversation Intelligibility: Intelligible Motor planning: Appears intact Motor speech errors: unaware and consistent Interfering components: Baseline cognitive-impairment Effective technique: slow rate, pause, and pacing  ORAL MOTOR EXAMINATION: Overall status: WFL Comments: N/A                                                                                                                             TREATMENT DATE:    05/19/24: Regina Wood (caregiver) reports that Regina Wood continues to require cues to breathe when she is cued to catch a bubble to stop talking. They have implemented the stop sign on paper as well, however this often results in Regina Wood laughing aloud at work. Regina Wood reports greatest success holding up her hand in the stop position to help Regina Wood stop talking. Today we targeted turn taking in conversation by asking you and tell me about questions. After I asked Regina Wood how she was doing, she required frequent max visual (pointing) phrase completion and verbal cues to ask me how I am doing. With usual mod verbal cues, topic of pets and restaurants, Regina Wood asked me, then Regina Wood about our pets (names) and our favorite restaurants. Role played asking the other person about what do you watch on TV to role play asking a you question in a familiar topic, as well as turn taking asking each other what movies we liked. Use of visual aid - written key  words on white board) to keep Regina Wood on topic - She brought up Valentine's Day - with visual cues and consistent semantic cues, she generated 3 facts about Valentine's day. After I asked her what she will do on valentine's Day she required frequent max A to return the question to me and Regina Wood. Written cues and examples sent home to mom, Regina Wood aware of strategies to support Thomas taking turns and asking others questions to support turn taking. See Patient Instructions  05/12/24: Pt returns with caregiver, Regina Wood. They had not implemented strategies to stop hyper-verbosity. Trained Regina Wood and Darby on strategy of catch a bubble puffing cheeks to keep mouth closed and breath 5-7 breaths. With verbal cue to catch a bubble and visual of counting breaths with my fingers, [name] stopped talking 5/5 opportunities. I printed a stop sign as well as visual to stop talking, puff cheeks and take 5-7 breaths. Used stop sign intermittently during card sort task - Regina Wood stopped talking and took breaths with  visual cue and modeling 5/5x.  Trained Regina Wood that Regina Wood will benefit from Dunstan modeling the behavior in tandem initially to help train [name] to the strategy. In conversation, Regina Wood followed either verbal cue to catch a bubble or visual cue of stop sign to stop talking and take 5 breaths with modeling.   03/29/24: Targeted compensatory strategies to stop hyperverbose conversation - with modeling and cue to catch a bubble and count to 10 forward and backward on her fingers with mouth closed and bubble in mouth - [name] completed this 6/6 trials in conversation. Targeted turn taking by generating 4 you questions she can ask her listener - role played turn taking with occasional min A [name] generated 2 questions to ask me. Targeted order at [employer] - with rare min A [name] used tapping her fingers to separate egg-drop-soup and egg-roll - no- broccoli to slow rate to improve intelligibility and  independence ordering.   03/23/24: No tx completed on this date.    PATIENT EDUCATION: Education details: Turn-taking during conversations. Person educated: Patient and Parent Education method: Explanation Education comprehension: verbalized understanding and needs further education   GOALS: Goals reviewed with patient? Yes  LONG TERM GOALS:  (LTGs=STGs) Target date: 102/18/26  Pt will demonstrate emerging use of turn-taking during conversation when given frequent max A. Baseline: Pt does not utilize turn-taking. Goal status: ONGOING  2.  Pt and family will adhere to communication support strategies to optimize pt's functional communication given frequent max A. Baseline: Pt and family do not utilize communication support strategies.  Goal status: ONGOING  3.  Pt will achieve 97-100% speech intelligibility during structured conversation/roleplaying given frequent max A.  Baseline: 95-97% Goal status: ONGOING    ASSESSMENT:  CLINICAL IMPRESSION: Patient is a 44 y.o. female who was seen today for a speech evaluation. Pt has a hx of developmental delay and intellectual disability. Pt presents with cognitive-communication deficits primarily in pragmatic conventions including turn-taking and inappropriate verbosity. Pt's mom notes that pt struggles with pragmatic communication during home interactions and at work; specifically, pt's mom states that pt has received complaints for talking too much at work. Pt has baseline cognitive-impairment from developmental delay and intellectual disability. Pt's speech is characterized by mostly articulatory deficits, most likely from hx of developmental delay.   OBJECTIVE IMPAIRMENTS: include expressive language, receptive language, and dysarthria. These impairments are limiting patient from effectively communicating at home and in community. Factors affecting potential to achieve goals and functional outcome are ability to learn/carryover  information, cooperation/participation level, previous level of function, and family/community support. Patient will benefit from skilled SLP services to address above impairments and improve overall function.  REHAB POTENTIAL: Fair    PLAN:  SLP FREQUENCY: 1x/week  SLP DURATION: 4 weeks  PLANNED INTERVENTIONS: Environmental controls, Cueing hierachy, Internal/external aids, Multimodal communication approach, SLP instruction and feedback, Compensatory strategies, and Patient/family education    Mathis Leita Caldron, CCC-SLP 05/19/2024, 2:26 PM      "

## 2024-05-19 NOTE — Patient Instructions (Signed)
" ° °  Today we talked about taking turns in conversation  We generated topics to ask you questions about:  Do you have pets?  What restaurant do you like  TV, Movies, Holidays  We practiced and role played taking turns in conversations and asking you questions  Or using tell me about...  Tell me about your pets  Tell me about your jewelry....  And staying on topic "

## 2024-05-24 ENCOUNTER — Ambulatory Visit: Payer: MEDICAID | Admitting: Neurology

## 2024-05-24 ENCOUNTER — Encounter: Payer: Self-pay | Admitting: Neurology

## 2024-05-24 VITALS — BP 117/81 | HR 97 | Ht 59.0 in | Wt 271.0 lb

## 2024-05-24 DIAGNOSIS — G08 Intracranial and intraspinal phlebitis and thrombophlebitis: Secondary | ICD-10-CM | POA: Diagnosis not present

## 2024-05-24 DIAGNOSIS — G43709 Chronic migraine without aura, not intractable, without status migrainosus: Secondary | ICD-10-CM | POA: Diagnosis not present

## 2024-05-24 DIAGNOSIS — R6250 Unspecified lack of expected normal physiological development in childhood: Secondary | ICD-10-CM | POA: Diagnosis not present

## 2024-05-24 DIAGNOSIS — G932 Benign intracranial hypertension: Secondary | ICD-10-CM | POA: Diagnosis not present

## 2024-05-24 MED ORDER — TOPIRAMATE 100 MG PO TABS: 300.0000 mg | ORAL_TABLET | Freq: Every day | ORAL | 11 refills | Status: AC

## 2024-05-24 NOTE — Progress Notes (Signed)
 "  Chief Complaint  Patient presents with   Follow-up    Pt in room 15. Parents in room. Here for IIH follow up.      ASSESSMENT AND PLAN  Regina Wood is a 44 y.o. female   Idiopathic intracranial hypertension Obesity Mental retardation Obstructive sleep apnea, could not tolerate CPAP Chronic headache  Most recent lumbar puncture was on May 14, 2024 opening pressure of 24 cm water  She is tolerating Topamax  300 mg every night, may try 400 mg every night, if she tolerate higher dose, family will call back for new prescription  Keep follow-up with ophthalmologist, may consider repeat LP if seeing worsening papular edema  Previous MRV suggested partial stenosis, foci of nonocclusive thrombosis within the vein, not a good candidate for anticoagulation, hypercoagulable panel was negative, would suggest aspirin 81 mg daily  Return to clinic in 6 months  DIAGNOSTIC DATA (LABS, IMAGING, TESTING) - I reviewed patient records, labs, notes, testing and imaging myself where available.   MEDICAL HISTORY:  Regina Wood, is a 44 year old female, follow-up for intracranial hypertension, was seen by Dr. Ines and nurse practitioner Lauraine in the past,  History is obtained from the patient and review of electronic medical records. I personally reviewed pertinent available imaging films in PACS.   PMHx of  Diabetes Hypertension Obesity Mental retardation  Patient is the only survivor child of her parents, she lost 2 siblings, she was born with mental retardation, went through special education, now attending a program daily,  Long history of obesity, was diagnosed with obstructive sleep apnea in the past, could not tolerate CPAP,  During her routine eye exam in January 2024, she was found to have pailledema, was referred to ER.  MRI of the brain and orbit with without contrast, in January 2024, no acute intracranial process, partially empty sella  CT venogram October 2024,  nonocclusive filling defect in the lateral aspect of both transverse sinus, favored to reflect artifact of prominent arachnoid granulations, however nonocclusive thrombosis cannot be excluded   MRV of the brain with without contrast February 2025 multifocal abnormal tissue along the walls of the bilateral transverse sinus and proximal left sigmoid sinus concerning for chronic nonocclusive thrombosis, additional foci of nonocclusive thrombosis versus septations within the mid right transverse sinus and in the distal left transverse sinus, proximal left sigmoid sinus, most prominent focus of abnormal tissue within the mid right transverse sinus associated with moderate stenosis.  Severe stenosis of the mid and distal left transverse sinus.  Lumbar puncture  February 2024, opening pressure of 24.5 cm water,  February 23 2023 opening pressure of 37 cm water May 14, 2024, opening pressure of 24 cm water, CSF showed mild elevated total protein of 49  Laboratory including hypercoagulable study June 2025, was negative, normal B12, vitamin D , TSH, LDL was 47, normal CMP with exception of mild elevation of alkaline phosphate 138,  She is seeing Dr. Eyvonne regularly, pending visit in March 2025, previously tried acetazolamide , could not tolerated even lower dose 250 mg, now on topiramate  100 mg 2 tablets every night, tolerating it well, no significant side effect  She was seen by weight loss program, has difficulty following up recommendation, was treated with Mounjaro, initially lost some weight, now regained back  She complains of daily headache, especially when first woke up, over half to take 2 tablets of Tylenol , go back to sleep again, woke up feel better, taking up to 2 g of Tylenol  daily  She  developed mild post LP headache following her most recent lumbar puncture on May 14, 2024, her headache seems to be improved now   PHYSICAL EXAM:   Vitals:   05/24/24 0903  BP: 117/81  Pulse: 97   Weight: 271 lb (122.9 kg)  Height: 4' 11 (1.499 m)     Body mass index is 54.74 kg/m.  PHYSICAL EXAMNIATION:  Gen: NAD, conversant, well nourised, well groomed                     Cardiovascular: Regular rate rhythm, no peripheral edema, warm, nontender. Eyes: Conjunctivae clear without exudates or hemorrhage Neck: Supple, no carotid bruits. Pulmonary: Clear to auscultation bilaterally   NEUROLOGICAL EXAM:  MENTAL STATUS: Speech/cognition: obese, pleasant, following verbal command CRANIAL NERVES: CN II: Visual fields are full to confrontation. Pupils are round equal and briskly reactive to light.  Evidence of bilateral papilledema on funduscopy examination CN III, IV, VI: extraocular movement are normal. No ptosis. CN V: Facial sensation is intact to light touch CN VII: Face is symmetric with normal eye closure  CN VIII: Hearing is normal to causal conversation. CN IX, X: Phonation is normal. CN XI: Head turning and shoulder shrug are intact  MOTOR: There is no pronator drift of out-stretched arms. Muscle bulk and tone are normal. Muscle strength is normal.  REFLEXES: Reflexes are 1 and symmetric at the biceps, triceps, knees, and ankles. Plantar responses are flexor.  SENSORY: Intact to light touch, pinprick and vibratory sensation are intact in fingers and toes.  COORDINATION: There is no trunk or limb dysmetria noted.  GAIT/STANCE: Limited due to her big body habitus  REVIEW OF SYSTEMS:  Full 14 system review of systems performed and notable only for as above All other review of systems were negative.   ALLERGIES: Allergies[1]  HOME MEDICATIONS: Current Outpatient Medications  Medication Sig Dispense Refill   acetaminophen  (TYLENOL ) 500 MG tablet Take 1,000 mg by mouth every 6 (six) hours as needed for moderate pain.     acetaZOLAMIDE  (DIAMOX ) 250 MG tablet Take 1 tablet (250 mg total) by mouth 2 (two) times daily. 60 tablet 11   albuterol  (VENTOLIN  HFA)  108 (90 Base) MCG/ACT inhaler Inhale 2 puffs into the lungs every 4 (four) hours as needed for wheezing or shortness of breath. 8 g 2   famotidine  (PEPCID ) 20 MG tablet TAKE 1 TABLET AFTER SUPPER 90 tablet 3   losartan  (COZAAR ) 100 MG tablet Take 100 mg by mouth daily.     meloxicam  (MOBIC ) 15 MG tablet Take 1 tablet (15 mg total) by mouth daily. 30 tablet 0   metFORMIN  (GLUCOPHAGE -XR) 750 MG 24 hr tablet Take 1,500 mg by mouth daily with breakfast.     MOUNJARO 5 MG/0.5ML Pen Inject 5 mg into the skin once a week.     pregabalin  (LYRICA ) 200 MG capsule TAKE 1 CAPSULE BY MOUTH TWICE A DAY 60 capsule 1   topiramate  (TOPAMAX ) 100 MG tablet Take 2 tablets (200 mg total) by mouth at bedtime. 60 tablet 6   benztropine  (COGENTIN ) 1 MG tablet Take 1 mg by mouth at bedtime.     blood glucose meter kit and supplies Dispense based on patient and insurance preference. Use up to four times daily as directed. (FOR ICD-10 E10.9, E11.9). 1 each 0   dapagliflozin  propanediol (FARXIGA ) 5 MG TABS tablet Take 5 mg by mouth daily.     diazepam  (VALIUM ) 5 MG tablet Take 1-2 tablets 30 minutes prior  to lumbar puncture,  may repeat once as needed. Must have driver. (Patient not taking: Reported on 05/24/2024) 3 tablet 0   nebivolol (BYSTOLIC) 2.5 MG tablet Take 2.5 mg by mouth daily. (Patient not taking: Reported on 05/24/2024)     ondansetron  (ZOFRAN -ODT) 8 MG disintegrating tablet Take 1 tablet (8 mg total) by mouth every 8 (eight) hours as needed for nausea or vomiting. (Patient not taking: Reported on 05/24/2024) 10 tablet 0   pantoprazole  (PROTONIX ) 40 MG tablet TAKE 1 TABLET (40 MG TOTAL) BY MOUTH DAILY. TAKE 30-60 MIN BEFORE FIRST MEAL OF THE DAY 90 tablet 1   Spacer/Aero-Holding Chambers (AEROCHAMBER MV) inhaler Use as instructed 1 each 0   No current facility-administered medications for this visit.    PAST MEDICAL HISTORY: Past Medical History:  Diagnosis Date   Back pain    Bilateral swelling of feet     CTS (carpal tunnel syndrome)    Diabetes mellitus without complication (HCC)    High blood pressure    High cholesterol    Idiopathic intracranial hypertension    Obesity    Pneumonia    SOB (shortness of breath)     PAST SURGICAL HISTORY: Past Surgical History:  Procedure Laterality Date   NO PAST SURGERIES     RADIOLOGY WITH ANESTHESIA N/A 06/19/2023   Procedure: MRI/MRV HEAD WITH AND WITOUT CONTRAST WITH ANESTHESIA;  Surgeon: Radiologist, Medication, MD;  Location: [facility] OR;  Service: Radiology;  Laterality: N/A;    FAMILY HISTORY: Family History  Problem Relation Age of Onset   Hyperlipidemia Mother    Hypertension Mother    Diabetes Mother    Healthy Mother    Migraines Mother    Depression Mother    Anxiety disorder Mother    Obesity Mother    Obesity Father    Cancer Father    Hypertension Father    Diabetes Father    Neuropathy Neg Hx    Pseudotumor cerebri Neg Hx     SOCIAL HISTORY: Social History   Socioeconomic History   Marital status: Single    Spouse name: Not on file   Number of children: Not on file   Years of education: Not on file   Highest education level: Not on file  Occupational History   Not on file  Tobacco Use   Smoking status: Never   Smokeless tobacco: Never  Vaping Use   Vaping status: Never Used  Substance and Sexual Activity   Alcohol  use: Yes    Comment: beer occ   Drug use: Never   Sexual activity: Not on file  Other Topics Concern   Not on file  Social History Narrative   Caffeine : in hot chocolate   Left handed   Lives at home with parents   Social Drivers of Health   Tobacco Use: Low Risk (05/24/2024)   Patient History    Smoking Tobacco Use: Never    Smokeless Tobacco Use: Never    Passive Exposure: Not on file  Financial Resource Strain: Low Risk (02/24/2024)   Received from [facility]   Overall Financial Resource Strain (CARDIA)    How hard is it for you to pay for the very basics like food, housing,  medical care, and heating?: Not hard at all  Food Insecurity: No Food Insecurity (02/24/2024)   Received from [facility]   Epic    Within the past 12 months, you worried that your food would run out before you got the money to buy  more.: Never true    Within the past 12 months, the food you bought just didn't last and you didn't have money to get more.: Never true  Transportation Needs: No Transportation Needs (02/24/2024)   Received from [facility]    In the past 12 months, has lack of transportation kept you from medical appointments or from getting medications?: No    In the past 12 months, has lack of transportation kept you from meetings, work, or from getting things needed for daily living?: No  Physical Activity: Not on file  Stress: Not on file  Social Connections: Not on file  Intimate Partner Violence: Not on file  Depression (PHQ2-9): Medium Risk (07/08/2023)   Depression (PHQ2-9)    PHQ-2 Score: 9  Alcohol  Screen: Not on file  Housing: Low Risk (02/24/2024)   Received from [facility]    In the last 12 months, was there a time when you were not able to pay the mortgage or rent on time?: No    In the past 12 months, how many times have you moved where you were living?: 0    At any time in the past 12 months, were you homeless or living in a shelter (including now)?: No  Utilities: Not At Risk (02/24/2024)   Received from [facility]    In the past 12 months has the electric, gas, oil, or water company threatened to shut off services in your home?: No  Health Literacy: Not on file      Modena Callander, M.D. Ph.D.  [facility] [address], [address] [place], [place] [address] Ph: [phone] Fax: [phone]  CC:  Stephane Leita DEL, MD [address] [place],  [place] [address]  Stephane Leita DEL, MD       [1] No Known Allergies  "

## 2024-05-26 ENCOUNTER — Encounter: Payer: Self-pay | Admitting: Speech Pathology

## 2024-05-26 ENCOUNTER — Ambulatory Visit: Admitting: Speech Pathology

## 2024-05-26 DIAGNOSIS — R41841 Cognitive communication deficit: Secondary | ICD-10-CM

## 2024-05-26 DIAGNOSIS — R471 Dysarthria and anarthria: Secondary | ICD-10-CM

## 2024-05-26 NOTE — Therapy (Signed)
 " OUTPATIENT SPEECH LANGUAGE PATHOLOGY TREATMENT & DISCHARGE SUMMARY   Patient Name: Regina Wood MRN: [id number] DOB:June 18, 1980, 44 y.o., female Today's Date: 05/26/2024  PCP: Stephane Leita DEL, MD REFERRING PROVIDER: Ines Onetha NOVAK, MD  END OF SESSION:  End of Session - 05/26/24 1253     Visit Number 5    Number of Visits 5    Date for Recertification  06/23/24    SLP Start Time 1232    SLP Stop Time  1315    SLP Time Calculation (min) 43 min    Activity Tolerance Patient tolerated treatment well          Past Medical History:  Diagnosis Date   Back pain    Bilateral swelling of feet    CTS (carpal tunnel syndrome)    Diabetes mellitus without complication (HCC)    High blood pressure    High cholesterol    Idiopathic intracranial hypertension    Obesity    Pneumonia    SOB (shortness of breath)    Past Surgical History:  Procedure Laterality Date   NO PAST SURGERIES     RADIOLOGY WITH ANESTHESIA N/A 06/19/2023   Procedure: MRI/MRV HEAD WITH AND WITOUT CONTRAST WITH ANESTHESIA;  Surgeon: Radiologist, Medication, MD;  Location: [facility] OR;  Service: Radiology;  Laterality: N/A;   Patient Active Problem List   Diagnosis Date Noted   Chronic migraine w/o aura w/o status migrainosus, not intractable 05/24/2024   Transverse sinus thrombosis 10/17/2023   Vitamin D  deficiency 07/22/2023   Hyperlipidemia associated with type 2 diabetes mellitus (HCC) 07/08/2023   Class 3 severe obesity due to excess calories with serious comorbidity and body mass index (BMI) of 50.0 to 59.9 in adult (HCC) 03/20/2023   Type 2 diabetes mellitus with hyperglycemia, without long-term current use of insulin  (HCC) 03/20/2023   Asthma 03/13/2023   IIH (idiopathic intracranial hypertension) 02/22/2023   Papilledema 02/22/2023   Severe headache 02/22/2023   Vision loss 02/22/2023   Diabetic renal disease (HCC) 01/29/2023   Skin sensation disturbance 01/29/2023   Suspected sleep apnea 04/19/2022    Chronic respiratory failure (HCC) 03/07/2022   Abnormal CT of the chest 12/31/2019   Intellectual disability 07/07/2019   Morbid (severe) obesity due to excess calories (HCC) 07/07/2019   Hyperglycemia 04/21/2019   Hypertension associated with diabetes (HCC) 04/21/2019   Developmental delay 04/21/2019    ONSET DATE: Developmental   REFERRING DIAG:  R62.50 (ICD-10-CM) - Developmental delay    THERAPY DIAG:  Dysarthria and anarthria  Cognitive communication deficit  Rationale for Evaluation and Treatment: Habilitation  SUBJECTIVE:   SUBJECTIVE STATEMENT:  Pt accompanied by: self and family member  PERTINENT HISTORY: SHAYLYNNE Wood is a 44 y.o. female with PMHX for developmental delay, diabetes, diabetic nephropathy.  PAIN:  Are you having pain? No  FALLS: Has patient fallen in last 6 months?  No  LIVING ENVIRONMENT: Lives with: lives with their family Lives in: House/apartment  PLOF:  Level of assistance: Needed assistance with ADLs Employment: Volunteer work  PATIENT GOALS: To improve speech.   OBJECTIVE:  Note: Objective measures were completed at Evaluation unless otherwise noted.  DIAGNOSTIC FINDINGS: N/A  COGNITION: Overall cognitive status: History of cognitive impairments - at baseline Areas of impairment:  Awareness: Impaired: Intellectual, Emergent, and Anticipatory Behavior: Impulsive and Poor frustration tolerance  AUDITORY COMPREHENSION: Overall auditory comprehension: Appears intact YES/NO questions: Appears intact Following directions: Appears intact Conversation: Moderately Complex Interfering components: processing speed and baseline intellectual disability Effective  technique: extra processing time, pausing, and visual/gestural cues   EXPRESSION: verbal  VERBAL EXPRESSION: Level of generative/spontaneous verbalization: conversation Automatic speech: name: intact, counting: intact, and day of week: intact  Repetition: Appears  intact Naming: Confrontation: 76-100% Pragmatics: Impaired: turn taking Comments: Pt's verbal expression appears WFL in consideration of PMHx of developmental delay and intellectual disability. Pt has pragmatic deficits; however, these are likely the result of developmental delay/ID.  Interfering components: premorbid deficit Effective technique: Extra processing time, verbal cues for topic maintenance. Non-verbal means of communication: N/A  MOTOR SPEECH: Overall motor speech: impaired Level of impairment: Conversation Respiration: thoracic breathing Phonation: normal Resonance: WFL Articulation: Impaired: word and conversation Intelligibility: Intelligible Motor planning: Appears intact Motor speech errors: unaware and consistent Interfering components: Baseline cognitive-impairment Effective technique: slow rate, pause, and pacing  ORAL MOTOR EXAMINATION: Overall status: WFL Comments: N/A                                                                                                                             TREATMENT DATE:    05/26/24: Mom in lobby with question re: adding visits and question re: strategies to stop hyperverbosity - explained this is last session and reviewed strategies. [name] present for tx session. Added and practiced strategy of holding finger over my mouth to stop [name] talking. In structured conversation - role play turn taking using visual board number 1-3 for her to say 3 ideas about a topic, then 1-3 for me and [name] to say 3 things about the topic after she asks us  a you question re: topic to support turn taking and topic maintenance. With visual aids and frequent mod verbal cues and modeling, [name] asked each of us  a you question re: the established topic and waited for us  to finish talking. She required consistent min to mod A to ID when she changes topic without transition. Through out role playing conversation,, I interjected strategies to stop  talking - [name] responded by stopping talking to catch a bubble model, hand up for stop and finger on my lips with rare min A - she remained silent for 15-20 seconds, until I spoke first. [name] reports intermittent success using hand up and finger to lips when she is with [name] at work. She verbalizes that ongoing practice will be required. Goals met in ST - ongoing practice at home and with caregivers to continue to increase success  05/19/24: [name] (caregiver) reports that [name] continues to require cues to breathe when she is cued to catch a bubble to stop talking. They have implemented the stop sign on paper as well, however this often results in [place] laughing aloud at work. [name] reports greatest success holding up her hand in the stop position to help [place] stop talking. Today we targeted turn taking in conversation by asking you and tell me about questions. After I asked [name] how she was doing, she required frequent max visual (pointing) phrase completion and verbal cues  to ask me how I am doing. With usual mod verbal cues, topic of pets and restaurants, Itzae asked me, [name] about our pets (names) and our favorite restaurants. Role played asking the other person about what do you watch on TV to role play asking a you question in a familiar topic, as well as turn taking asking each other what movies we liked. Use of visual aid - written key words on white board) to keep [name] on topic - She brought up Valentine's Day - with visual cues and consistent semantic cues, she generated 3 facts about Valentine's day. After I asked her what she will do on valentine's Day she required frequent max A to return the question to me and [place]. Written cues and examples sent home to mom, [name] aware of strategies to support [place] taking turns and asking others questions to support turn taking. See Patient Instructions  05/12/24: Pt returns with caregiver, [name]. They had not  implemented strategies to stop hyper-verbosity. Trained [name] and [place] on strategy of catch a bubble puffing cheeks to keep mouth closed and breath 5-7 breaths. With verbal cue to catch a bubble and visual of counting breaths with my fingers, [name] stopped talking 5/5 opportunities. I printed a stop sign as well as visual to stop talking, puff cheeks and take 5-7 breaths. Used stop sign intermittently during card sort task - [name] stopped talking and took breaths with visual cue and modeling 5/5x.  Trained [name] that [name] will benefit from [place] modeling the behavior in tandem initially to help train [name] to the strategy. In conversation, [name] followed either verbal cue to catch a bubble or visual cue of stop sign to stop talking and take 5 breaths with modeling.   03/29/24: Targeted compensatory strategies to stop hyperverbose conversation - with modeling and cue to catch a bubble and count to 10 forward and backward on her fingers with mouth closed and bubble in mouth - [name] completed this 6/6 trials in conversation. Targeted turn taking by generating 4 you questions she can ask her listener - role played turn taking with occasional min A [name] generated 2 questions to ask me. Targeted order at [employer] - with rare min A [name] used tapping her fingers to separate egg-drop-soup and egg-roll - no- broccoli to slow rate to improve intelligibility and independence ordering.   03/23/24: No tx completed on this date.    PATIENT EDUCATION: Education details: Turn-taking during conversations. Person educated: Patient and Parent Education method: Explanation Education comprehension: verbalized understanding and needs further education   GOALS: Goals reviewed with patient? Yes  LONG TERM GOALS:  (LTGs=STGs) Target date: 05/23/24  Pt will demonstrate emerging use of turn-taking during conversation when given frequent max A. Baseline: Pt does not utilize turn-taking. Goal status: MET  2.   Pt and family will adhere to communication support strategies to optimize pt's functional communication given frequent max A. Baseline: Pt and family do not utilize communication support strategies.  Goal status: MET  3.  Pt will achieve 97-100% speech intelligibility during structured conversation/roleplaying given frequent max A.  Baseline: 95-97% Goal status: PARTIALLY MET (90%)    ASSESSMENT:  CLINICAL IMPRESSION: Patient is a 44 y.o. female who was seen today for a speech evaluation. Pt has a hx of developmental delay and intellectual disability. Pt presents with cognitive-communication deficits primarily in pragmatic conventions including turn-taking and inappropriate verbosity. Articulation errors c/w developmental delay. Trained pt and caregiver in visual and verbal strategies to reduce  verbosity and cue [name] to stop talking. In ST pt follows these cues with occasional min A. Caregiver reports emerging but inconsistent success outside of ST. Trained pt and caregiver in strategies to encourage turn taking, including asking you questions to others to allow them a turn. In role play in ST, with frequent mod A, [name] carries over strategies. Again, she will benefit from ongoing practice in real life communication situations. [name] demonstrates adequate cueing of the strategies with rare min A and will continue to use strategies to support successful communication with [name]. Goals met, d/c ST.   OBJECTIVE IMPAIRMENTS: include expressive language, receptive language, and dysarthria. These impairments are limiting patient from effectively communicating at home and in community. Factors affecting potential to achieve goals and functional outcome are ability to learn/carryover information, cooperation/participation level, previous level of function, and family/community support. Patient will benefit from skilled SLP services to address above impairments and improve overall function.  REHAB  POTENTIAL: Fair    PLAN:  SLP FREQUENCY: 1x/week  SLP DURATION: 4 weeks  PLANNED INTERVENTIONS: Environmental controls, Cueing hierachy, Internal/external aids, Multimodal communication approach, SLP instruction and feedback, Compensatory strategies, and Patient/family education  SPEECH THERAPY DISCHARGE SUMMARY  Visits from Start of Care: 5  Current functional level related to goals / functional outcomes: See goals above   Remaining deficits: Articulation errors c/w developmental delay; cognition c/w developmental delay   Education / Equipment: Compensatory strategies to support successful social interactions and communication   Patient agrees to discharge. Patient goals were met. Patient is being discharged due to meeting the stated rehab goals..     Opal Dinning Ann, CCC-SLP 05/26/2024, 2:15 PM      "

## 2024-05-26 NOTE — Patient Instructions (Signed)
" ° °  Hold finger up to lips (like a sh) and have her do the same  Hand up to indicate stop talking  Review: we keep thoughts in our head - don't say everything out loud  If it's none of your business don't say it out loud  Strangers are none of your business    "

## 2024-06-11 ENCOUNTER — Ambulatory Visit: Payer: Self-pay | Admitting: Neurology

## 2024-06-11 LAB — GRAM STAIN
MICRO NUMBER:: 17449091
SPECIMEN QUALITY:: ADEQUATE

## 2024-06-11 LAB — FUNGUS CULTURE W SMEAR
CULTURE:: NO GROWTH
MICRO NUMBER:: 17449092
SMEAR:: NONE SEEN
SPECIMEN QUALITY:: ADEQUATE

## 2024-06-11 LAB — CSF CELL COUNT WITH DIFFERENTIAL
RBC Count, CSF: 11 {cells}/uL — ABNORMAL HIGH
TOTAL NUCLEATED CELL: 1 {cells}/uL (ref 0–5)

## 2024-06-11 LAB — VDRL, CSF: VDRL Quant, CSF: NONREACTIVE

## 2024-06-11 LAB — PROTEIN, CSF: Total Protein, CSF: 49 mg/dL — ABNORMAL HIGH (ref 15–45)

## 2024-06-11 LAB — GLUCOSE, CSF: Glucose, CSF: 81 mg/dL — ABNORMAL HIGH (ref 40–80)

## 2024-09-09 ENCOUNTER — Other Ambulatory Visit

## 2024-11-22 ENCOUNTER — Ambulatory Visit: Payer: MEDICAID | Admitting: Neurology
